# Patient Record
Sex: Female | Born: 1935 | Race: White | Hispanic: No | Smoking: Former smoker
Health system: Southern US, Community
[De-identification: ages and names within clinical notes are randomized; demographics above are authoritative.]

## PROBLEM LIST (undated history)

## (undated) DIAGNOSIS — E785 Hyperlipidemia, unspecified: Secondary | ICD-10-CM

## (undated) DIAGNOSIS — G43909 Migraine, unspecified, not intractable, without status migrainosus: Secondary | ICD-10-CM

## (undated) DIAGNOSIS — I4891 Unspecified atrial fibrillation: Secondary | ICD-10-CM

## (undated) DIAGNOSIS — I639 Cerebral infarction, unspecified: Secondary | ICD-10-CM

## (undated) HISTORY — DX: Migraine, unspecified, not intractable, without status migrainosus: G43.909

## (undated) HISTORY — DX: Cerebral infarction, unspecified: I63.9

## (undated) HISTORY — DX: Hyperlipidemia, unspecified: E78.5

## (undated) HISTORY — DX: Unspecified atrial fibrillation: I48.91

---

## 1998-08-26 ENCOUNTER — Other Ambulatory Visit: Admission: RE | Admit: 1998-08-26 | Discharge: 1998-08-26 | Payer: Self-pay

## 2005-01-27 ENCOUNTER — Emergency Department: Payer: Self-pay | Admitting: Unknown Physician Specialty

## 2005-02-01 ENCOUNTER — Ambulatory Visit: Payer: Self-pay | Admitting: Ophthalmology

## 2005-02-07 ENCOUNTER — Ambulatory Visit: Payer: Self-pay | Admitting: Ophthalmology

## 2006-08-06 ENCOUNTER — Ambulatory Visit: Payer: Self-pay | Admitting: Geriatric Medicine

## 2006-09-05 ENCOUNTER — Inpatient Hospital Stay: Payer: Self-pay | Admitting: Internal Medicine

## 2006-09-05 ENCOUNTER — Other Ambulatory Visit: Payer: Self-pay

## 2007-02-05 ENCOUNTER — Ambulatory Visit: Payer: Self-pay

## 2007-07-09 ENCOUNTER — Ambulatory Visit: Payer: Self-pay | Admitting: Internal Medicine

## 2007-08-11 ENCOUNTER — Ambulatory Visit: Payer: Self-pay | Admitting: Internal Medicine

## 2007-11-07 ENCOUNTER — Ambulatory Visit: Payer: Self-pay | Admitting: Internal Medicine

## 2007-12-04 ENCOUNTER — Other Ambulatory Visit: Payer: Self-pay

## 2007-12-04 ENCOUNTER — Ambulatory Visit: Payer: Self-pay | Admitting: Internal Medicine

## 2008-02-17 ENCOUNTER — Ambulatory Visit: Payer: Self-pay

## 2008-08-11 ENCOUNTER — Emergency Department: Payer: Self-pay | Admitting: Emergency Medicine

## 2008-10-07 ENCOUNTER — Ambulatory Visit: Payer: Self-pay | Admitting: Internal Medicine

## 2009-03-16 ENCOUNTER — Ambulatory Visit: Payer: Self-pay | Admitting: Ophthalmology

## 2009-07-18 ENCOUNTER — Emergency Department: Payer: Self-pay | Admitting: Emergency Medicine

## 2009-08-16 ENCOUNTER — Ambulatory Visit: Payer: Self-pay | Admitting: Internal Medicine

## 2009-10-31 ENCOUNTER — Ambulatory Visit: Payer: Self-pay | Admitting: Internal Medicine

## 2010-05-03 ENCOUNTER — Ambulatory Visit: Payer: Self-pay | Admitting: Internal Medicine

## 2010-07-26 ENCOUNTER — Ambulatory Visit: Payer: Medicare Other | Admitting: Internal Medicine

## 2010-12-12 ENCOUNTER — Ambulatory Visit: Payer: Medicare Other | Admitting: Internal Medicine

## 2011-02-13 ENCOUNTER — Other Ambulatory Visit (INDEPENDENT_AMBULATORY_CARE_PROVIDER_SITE_OTHER): Payer: Medicare Other | Admitting: *Deleted

## 2011-02-13 DIAGNOSIS — Z7901 Long term (current) use of anticoagulants: Secondary | ICD-10-CM

## 2011-02-14 ENCOUNTER — Encounter: Payer: Self-pay | Admitting: Internal Medicine

## 2011-03-19 ENCOUNTER — Other Ambulatory Visit (INDEPENDENT_AMBULATORY_CARE_PROVIDER_SITE_OTHER): Payer: Medicare Other | Admitting: *Deleted

## 2011-03-19 DIAGNOSIS — Z7901 Long term (current) use of anticoagulants: Secondary | ICD-10-CM

## 2011-03-20 LAB — PROTIME-INR
INR: 2.49 — ABNORMAL HIGH (ref <1.50)
Prothrombin Time: 27.7 seconds — ABNORMAL HIGH (ref 11.6–15.2)

## 2011-03-22 ENCOUNTER — Other Ambulatory Visit: Payer: Self-pay | Admitting: Internal Medicine

## 2011-03-30 ENCOUNTER — Encounter: Payer: Self-pay | Admitting: Internal Medicine

## 2011-03-30 ENCOUNTER — Ambulatory Visit (INDEPENDENT_AMBULATORY_CARE_PROVIDER_SITE_OTHER): Payer: Medicare Other | Admitting: Internal Medicine

## 2011-03-30 VITALS — BP 148/78 | HR 69 | Temp 98.2°F | Resp 16

## 2011-03-30 DIAGNOSIS — R269 Unspecified abnormalities of gait and mobility: Secondary | ICD-10-CM

## 2011-03-30 DIAGNOSIS — R2681 Unsteadiness on feet: Secondary | ICD-10-CM

## 2011-03-30 DIAGNOSIS — Z7901 Long term (current) use of anticoagulants: Secondary | ICD-10-CM

## 2011-03-30 DIAGNOSIS — R42 Dizziness and giddiness: Secondary | ICD-10-CM

## 2011-03-30 NOTE — Progress Notes (Signed)
Subjective:    Patient ID: Alicia Moran, female    DOB: 02-16-1929, 77 y.o.   MRN: [id number]  HPI 77 yo white female with history of atrial fibrillation, on chronic coumadin,  Migraine headaches, recurrent dizziness, returns for followup.  She saw her  cardiologist Dr. Arlana Pouch at [facility]  recently and had a repeat ECHO which was reportedly normal.  She has been having increased difficulty with dizziness and has been using a rolling walker for a week to accomplish her ADLs  because she has had several falls both at home and outside of the home.  She attributes her falls to a change in her vision .  Her vision has been affected by recent glaucoma surgery and she is having recurrent dizziness due to inability to focus.  No past medical history on file.  Review of Systems  Constitutional: Negative for fever, chills and unexpected weight change.  HENT: Positive for nosebleeds. Negative for hearing loss, ear pain, congestion, facial swelling, rhinorrhea, sneezing, mouth sores, trouble swallowing, neck pain, neck stiffness, voice change, postnasal drip, sinus pressure, tinnitus and ear discharge. Sore throat:       Eyes: Negative for pain, discharge, redness and visual disturbance.  Respiratory: Positive for cough. Negative for chest tightness, shortness of breath, wheezing and stridor.   Cardiovascular: Negative for chest pain, palpitations and leg swelling.  Musculoskeletal: Positive for gait problem. Negative for myalgias and arthralgias.  Skin: Negative for color change and rash.  Neurological: Positive for dizziness and light-headedness. Negative for weakness and headaches.  Hematological: Negative for adenopathy.       Objective:   Physical Exam  Constitutional: She is oriented to person, place, and time. She appears well-developed and well-nourished.  HENT:  Mouth/Throat: Oropharynx is clear and moist.  Eyes: EOM are normal. Pupils are equal, round, and reactive to light. No  scleral icterus.  Neck: Normal range of motion. Neck supple. No JVD present. No thyromegaly present.  Cardiovascular: Normal rate, regular rhythm, normal heart sounds and intact distal pulses.   Pulmonary/Chest: Effort normal and breath sounds normal.  Abdominal: Soft. Bowel sounds are normal. She exhibits no mass. There is no tenderness.  Musculoskeletal: Normal range of motion. She exhibits no edema.  Lymphadenopathy:    She has no cervical adenopathy.  Neurological: She is alert and oriented to person, place, and time. She has normal strength. Gait abnormal.       Patient is unsteady of gait due to feeling dizzy and being unable to focus eyes quickly with changes in head position.   She cannot turn or pivot without loss of balance.   Skin: Skin is warm and dry.  Psychiatric: She has a normal mood and affect.          Assessment & Plan:  Dizziness, unsteady gait:  Previous evaluatiosn have found no reversible causes.  I would like to refer her for vestibular PT, which can be done either through [facility] in the home or at [facility].

## 2011-03-30 NOTE — Patient Instructions (Signed)
To avoid an extra coumadin check,, start your melatonin the last week of October (one week before your next coumadin check).  We will check your cholesterol and b12 in two week with your next coumadin check.  [facility] wi[facility] contact you about home physical therapy for dizziness.   3 month followup

## 2011-04-01 ENCOUNTER — Encounter: Payer: Self-pay | Admitting: Internal Medicine

## 2011-04-01 DIAGNOSIS — E7800 Pure hypercholesterolemia, unspecified: Secondary | ICD-10-CM | POA: Insufficient documentation

## 2011-04-01 DIAGNOSIS — I639 Cerebral infarction, unspecified: Secondary | ICD-10-CM | POA: Insufficient documentation

## 2011-04-01 DIAGNOSIS — Z7901 Long term (current) use of anticoagulants: Secondary | ICD-10-CM | POA: Insufficient documentation

## 2011-04-01 DIAGNOSIS — G43909 Migraine, unspecified, not intractable, without status migrainosus: Secondary | ICD-10-CM | POA: Insufficient documentation

## 2011-04-01 DIAGNOSIS — I480 Paroxysmal atrial fibrillation: Secondary | ICD-10-CM | POA: Insufficient documentation

## 2011-04-01 NOTE — Assessment & Plan Note (Signed)
Her PT/INR is checked monthly and has been stable.

## 2011-04-02 ENCOUNTER — Other Ambulatory Visit: Payer: Self-pay | Admitting: Internal Medicine

## 2011-04-02 DIAGNOSIS — R42 Dizziness and giddiness: Secondary | ICD-10-CM

## 2011-04-02 DIAGNOSIS — H409 Unspecified glaucoma: Secondary | ICD-10-CM

## 2011-04-02 DIAGNOSIS — R269 Unspecified abnormalities of gait and mobility: Secondary | ICD-10-CM

## 2011-04-12 ENCOUNTER — Other Ambulatory Visit: Payer: Self-pay | Admitting: Internal Medicine

## 2011-04-12 DIAGNOSIS — R42 Dizziness and giddiness: Secondary | ICD-10-CM

## 2011-04-18 ENCOUNTER — Other Ambulatory Visit (INDEPENDENT_AMBULATORY_CARE_PROVIDER_SITE_OTHER): Payer: Medicare Other | Admitting: *Deleted

## 2011-04-18 DIAGNOSIS — Z7901 Long term (current) use of anticoagulants: Secondary | ICD-10-CM

## 2011-04-18 LAB — PROTIME-INR
INR: 2.5 ratio — ABNORMAL HIGH (ref 0.8–1.0)
Prothrombin Time: 27.5 s — ABNORMAL HIGH (ref 10.2–12.4)

## 2011-05-02 DIAGNOSIS — R42 Dizziness and giddiness: Secondary | ICD-10-CM

## 2011-05-02 DIAGNOSIS — M216X9 Other acquired deformities of unspecified foot: Secondary | ICD-10-CM

## 2011-05-02 DIAGNOSIS — IMO0001 Reserved for inherently not codable concepts without codable children: Secondary | ICD-10-CM

## 2011-05-02 DIAGNOSIS — R269 Unspecified abnormalities of gait and mobility: Secondary | ICD-10-CM

## 2011-05-21 ENCOUNTER — Other Ambulatory Visit (INDEPENDENT_AMBULATORY_CARE_PROVIDER_SITE_OTHER): Payer: Medicare Other | Admitting: *Deleted

## 2011-05-21 DIAGNOSIS — Z7901 Long term (current) use of anticoagulants: Secondary | ICD-10-CM

## 2011-05-21 LAB — PROTIME-INR: Prothrombin Time: 30.7 s — ABNORMAL HIGH (ref 10.2–12.4)

## 2011-06-13 ENCOUNTER — Encounter: Payer: Medicare Other | Admitting: Otolaryngology

## 2011-06-20 ENCOUNTER — Other Ambulatory Visit (INDEPENDENT_AMBULATORY_CARE_PROVIDER_SITE_OTHER): Payer: Medicare Other | Admitting: *Deleted

## 2011-06-20 DIAGNOSIS — Z7901 Long term (current) use of anticoagulants: Secondary | ICD-10-CM

## 2011-06-20 LAB — PROTIME-INR: Prothrombin Time: 32.8 s — ABNORMAL HIGH (ref 10.2–12.4)

## 2011-06-29 ENCOUNTER — Encounter: Payer: Self-pay | Admitting: Internal Medicine

## 2011-06-29 ENCOUNTER — Ambulatory Visit (INDEPENDENT_AMBULATORY_CARE_PROVIDER_SITE_OTHER): Payer: Medicare Other | Admitting: Internal Medicine

## 2011-06-29 DIAGNOSIS — E785 Hyperlipidemia, unspecified: Secondary | ICD-10-CM

## 2011-06-29 DIAGNOSIS — I4891 Unspecified atrial fibrillation: Secondary | ICD-10-CM

## 2011-06-29 DIAGNOSIS — Z7901 Long term (current) use of anticoagulants: Secondary | ICD-10-CM

## 2011-06-29 DIAGNOSIS — H532 Diplopia: Secondary | ICD-10-CM

## 2011-06-29 DIAGNOSIS — Z79899 Other long term (current) drug therapy: Secondary | ICD-10-CM

## 2011-06-29 NOTE — Progress Notes (Signed)
Subjective:    Patient ID: Alicia Moran, female    DOB: [birth year], 78 y.o.   MRN: [id number]  HPI  Alicia Moran is an 84 yr white female with a history of atrial fibrillation , on chronic anticoagulation with coumadin, who presents for 6 month followup.  She has been having double vision since her eye surgery in April. She has been referred to a neuromuscular eye specialist at [facility] but has not seen him yet.  She also reports that her balance and gait have also been worse since April and wonders if she had a light CVA furing the peroperative period when she was not anticoagulated.  Her double vision is brought on by deviationof eyes to her left and downward, but not when looking directly in .front of her . Past Medical History  Diagnosis Date  . Atrial fibrillation   . Stroke   . Migraine headache   . Hyperlipidemia    Current Outpatient Prescriptions on File Prior to Visit  Medication Sig Dispense Refill  . simvastatin (ZOCOR) 10 MG tablet Take 10 mg by mouth at bedtime.        Marland Kitchen warfarin (COUMADIN) 4 MG tablet           Review of Systems  Constitutional: Negative for fever, chills, appetite change, fatigue and unexpected weight change.  HENT: Negative for ear pain, congestion, sore throat, trouble swallowing, neck pain, voice change and sinus pressure.   Eyes: Negative for visual disturbance.  Respiratory: Negative for cough, shortness of breath, wheezing and stridor.   Cardiovascular: Negative for chest pain, palpitations and leg swelling.  Gastrointestinal: Negative for nausea, vomiting, abdominal pain, diarrhea, constipation, blood in stool, abdominal distention and anal bleeding.  Genitourinary: Negative for dysuria and flank pain.  Musculoskeletal: Positive for gait problem. Negative for myalgias and arthralgias.  Skin: Negative for color change and rash.  Neurological: Positive for light-headedness. Negative for dizziness and headaches.  Hematological: Negative for adenopathy.  Does not bruise/bleed easily.  Psychiatric/Behavioral: Negative for suicidal ideas, sleep disturbance and dysphoric mood. The patient is not nervous/anxious.        Objective:   Physical Exam  Constitutional: She is oriented to person, place, and time. She appears well-developed and well-nourished.  HENT:  Mouth/Throat: Oropharynx is clear and moist.  Eyes: EOM are normal. Pupils are equal, round, and reactive to light. No scleral icterus.  Neck: Normal range of motion. Neck supple. No JVD present. No thyromegaly present.  Cardiovascular: Normal rate, regular rhythm, normal heart sounds and intact distal pulses.   Pulmonary/Chest: Effort normal and breath sounds normal.  Abdominal: Soft. Bowel sounds are normal. She exhibits no mass. There is no tenderness.  Musculoskeletal: Normal range of motion. She exhibits no edema.  Lymphadenopathy:    She has no cervical adenopathy.  Neurological: She is alert and oriented to person, place, and time.  Skin: Skin is warm and dry.  Psychiatric: She has a normal mood and affect.          Assessment & Plan:   Diplopia Positional, suggesting that her medial rectus muscle was either damaged during the surgery or during a perioperative CVA.  Since she is scheduled to see a neuromuscular specialist, will defer MRI in the event that he wants one done at [facility] rather than at [facility].   Hyperlipidemia Managed with simvastatin, lipids due.  Goal is < 100; prior cva was embolic.  Atrial fibrillation Rate controlled with metoprolol.  Encounter for long-term (current) use of anticoagulants She  is due for PT/INR ; goal is 2.0 to 3.0 for mitigation of embolic phenomena due to atrial fib.     Updated Medication List Outpatient Encounter Prescriptions as of 06/29/2011  Medication Sig Dispense Refill  . metoprolol succinate (TOPROL XL) 25 MG 24 hr tablet Take 25 mg by mouth daily.      . simvastatin (ZOCOR) 10 MG tablet Take 10 mg by mouth at bedtime.         Marland Kitchen warfarin (COUMADIN) 4 MG tablet        . DISCONTD: metoprolol tartrate (LOPRESSOR) 25 MG tablet Take 25 mg by mouth daily.

## 2011-07-01 ENCOUNTER — Encounter: Payer: Self-pay | Admitting: Internal Medicine

## 2011-07-01 DIAGNOSIS — H532 Diplopia: Secondary | ICD-10-CM | POA: Insufficient documentation

## 2011-07-01 NOTE — Patient Instructions (Signed)
Return in [facility] February for your fasting labs and Pt/INR

## 2011-07-01 NOTE — Assessment & Plan Note (Signed)
Positional, suggesting that her medial rectus muscle was either damaged during the surgery or during a perioperative CVA.  Since she is scheduled to see a neuromuscular specialist, will defer MRI in the event that he wants one done at [facility] rather than at [facility].

## 2011-07-01 NOTE — Assessment & Plan Note (Signed)
Managed with simvastatin, lipids due.  Goal is < 100; prior cva was embolic.

## 2011-07-01 NOTE — Assessment & Plan Note (Signed)
She is due for PT/INR ; goal is 2.0 to 3.0 for mitigation of embolic phenomena due to atrial fib.

## 2011-07-01 NOTE — Assessment & Plan Note (Signed)
Rate controlled with metoprolol °

## 2011-07-04 ENCOUNTER — Telehealth: Payer: Self-pay | Admitting: *Deleted

## 2011-07-04 NOTE — Telephone Encounter (Signed)
If she is not having fevers or green/brown sputum she does not need an antibiotic because she has a viral infection.  She can take Robitussin DM or any cough syrup that has guaifenesin and dextromethorphan comibined for cough.  Gargle with salt water for the sore throat.  If no better by Friday, I will call her in an antibioitc

## 2011-07-04 NOTE — Telephone Encounter (Signed)
Pt states she has had tightness in her chest since Saturday morning, sore throat, dry cough and dry mouth.  She had temp of 99.2 on Saturday.  She is requesting antibiotic, but I told her she may need to be seen first.  What do you suggest she take otc?  She is drinking plenty of water.

## 2011-07-04 NOTE — Telephone Encounter (Signed)
Advised pt, she will call back if not better.

## 2011-07-10 ENCOUNTER — Telehealth: Payer: Self-pay | Admitting: Internal Medicine

## 2011-07-10 NOTE — Telephone Encounter (Signed)
Sudafed pe 10  Mg every 6 hours prn congestion

## 2011-07-10 NOTE — Telephone Encounter (Signed)
[phone] Pt called her rx from Friday has not been called in [facility] pharmacy

## 2011-07-10 NOTE — Telephone Encounter (Signed)
See note from 1/16.  Pt is still not any better, taking mucinix and drinking plenty of fluids but that's not helping.  She has a lot of sinus pressure, feels like her head is going to explode.  [facility]ses [facility].

## 2011-07-11 NOTE — Telephone Encounter (Signed)
Pt has been advised.

## 2011-07-23 ENCOUNTER — Other Ambulatory Visit (INDEPENDENT_AMBULATORY_CARE_PROVIDER_SITE_OTHER): Payer: Medicare Other | Admitting: *Deleted

## 2011-07-23 ENCOUNTER — Encounter: Payer: Self-pay | Admitting: *Deleted

## 2011-07-23 DIAGNOSIS — Z79899 Other long term (current) drug therapy: Secondary | ICD-10-CM

## 2011-07-23 DIAGNOSIS — Z7901 Long term (current) use of anticoagulants: Secondary | ICD-10-CM

## 2011-07-23 DIAGNOSIS — E785 Hyperlipidemia, unspecified: Secondary | ICD-10-CM

## 2011-07-23 DIAGNOSIS — D510 Vitamin B12 deficiency anemia due to intrinsic factor deficiency: Secondary | ICD-10-CM

## 2011-07-23 LAB — COMPREHENSIVE METABOLIC PANEL
ALT: 11 U/L (ref 0–35)
Albumin: 4.1 g/dL (ref 3.5–5.2)
Alkaline Phosphatase: 82 U/L (ref 39–117)
Potassium: 4 mEq/L (ref 3.5–5.1)
Sodium: 135 mEq/L (ref 135–145)
Total Bilirubin: 0.6 mg/dL (ref 0.3–1.2)
Total Protein: 6.7 g/dL (ref 6.0–8.3)

## 2011-07-23 LAB — LIPID PANEL
HDL: 66.4 mg/dL (ref >39.00)
Total CHOL/HDL Ratio: 2
VLDL: 6.6 mg/dL (ref 0.0–40.0)

## 2011-07-25 ENCOUNTER — Other Ambulatory Visit: Payer: Medicare Other

## 2011-07-25 DIAGNOSIS — Z1211 Encounter for screening for malignant neoplasm of colon: Secondary | ICD-10-CM

## 2011-07-25 LAB — FECAL OCCULT BLOOD, IMMUNOCHEMICAL: Fecal Occult Bld: NEGATIVE

## 2011-08-20 ENCOUNTER — Other Ambulatory Visit (INDEPENDENT_AMBULATORY_CARE_PROVIDER_SITE_OTHER): Payer: Medicare Other | Admitting: *Deleted

## 2011-08-20 DIAGNOSIS — Z7901 Long term (current) use of anticoagulants: Secondary | ICD-10-CM

## 2011-08-20 DIAGNOSIS — I4891 Unspecified atrial fibrillation: Secondary | ICD-10-CM

## 2011-08-20 LAB — PROTIME-INR: INR: 3.5 ratio — ABNORMAL HIGH (ref 0.8–1.0)

## 2011-08-22 ENCOUNTER — Other Ambulatory Visit: Payer: Self-pay | Admitting: *Deleted

## 2011-08-22 MED ORDER — WARFARIN SODIUM 3 MG PO TABS: 3.0000 mg | ORAL_TABLET | Freq: Every day | ORAL | Status: DC

## 2011-09-03 ENCOUNTER — Other Ambulatory Visit (INDEPENDENT_AMBULATORY_CARE_PROVIDER_SITE_OTHER): Payer: Medicare Other | Admitting: *Deleted

## 2011-09-03 DIAGNOSIS — I4891 Unspecified atrial fibrillation: Secondary | ICD-10-CM

## 2011-09-03 DIAGNOSIS — Z7901 Long term (current) use of anticoagulants: Secondary | ICD-10-CM

## 2011-09-03 LAB — PROTIME-INR
INR: 2.3 ratio — ABNORMAL HIGH (ref 0.8–1.0)
Prothrombin Time: 24.9 s — ABNORMAL HIGH (ref 10.2–12.4)

## 2011-09-28 ENCOUNTER — Encounter: Payer: Self-pay | Admitting: Internal Medicine

## 2011-10-03 ENCOUNTER — Encounter: Payer: Self-pay | Admitting: Internal Medicine

## 2011-10-03 ENCOUNTER — Ambulatory Visit (INDEPENDENT_AMBULATORY_CARE_PROVIDER_SITE_OTHER): Payer: Medicare Other | Admitting: Internal Medicine

## 2011-10-03 ENCOUNTER — Other Ambulatory Visit (INDEPENDENT_AMBULATORY_CARE_PROVIDER_SITE_OTHER): Payer: Medicare Other | Admitting: *Deleted

## 2011-10-03 VITALS — BP 130/82 | HR 67 | Temp 98.7°F | Resp 16 | Wt 143.5 lb

## 2011-10-03 DIAGNOSIS — Z7901 Long term (current) use of anticoagulants: Secondary | ICD-10-CM

## 2011-10-03 DIAGNOSIS — L723 Sebaceous cyst: Secondary | ICD-10-CM

## 2011-10-03 NOTE — Progress Notes (Signed)
Patient ID: Alicia Moran, female   DOB: 06-01-1929, 78 y.o.   MRN: [id number]  Patient Active Problem List  Diagnoses  . Atrial fibrillation  . Stroke  . Migraine headache  . Hyperlipidemia  . Encounter for long-term (current) use of anticoagulants  . Diplopia  . Sebaceous cyst    Subjective:  CC:   No chief complaint on file.   HPI:   Alicia Moran a 78 y.o. female who presents with a small tender mass on left buttock near the rectum.  Noticed tenderness a day or two ago and was concerned about a hemorrhoid.  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 ,  Complicated by dizziness.  Saw a neuro opthalmologist  Alicia Moran, [facility] on [address] who opined that the double vision is a result of the surgery .  Because of her persistent dizziness he has recommended she see a neurologist at [facility].     Past Medical History  Diagnosis Date  . Atrial fibrillation   . Migraine headache   . Hyperlipidemia   . Stroke     No past surgical history on file.       The following portions of the patient's history were reviewed and updated as appropriate: Allergies, current medications, and problem list.    Review of Systems:   12 Pt  review of systems was negative except those addressed in the HPI,     History   Social History  . Marital Status: Married    Spouse Name: N/A    Number of Children: N/A  . Years of Education: N/A   Occupational History  . Not on file.   Social History Main Topics  . Smoking status: Former Smoker    Quit date: 03/29/1986  . Smokeless tobacco: Never Used  . Alcohol Use: No  . Drug Use: No  . Sexually Active: Not on file   Other Topics Concern  . Not on file   Social History Narrative  . No narrative on file    Objective:  BP 130/82  Pulse 67  Temp(Src) 98.7 F (37.1 C) (Oral)  Resp 16  Wt 143 lb 8 oz (65.091 kg)  SpO2 99%  General appearance:  alert, cooperative and appears stated age Ears: normal TM's and external ear canals both ears Throat: lips, mucosa, and tongue normal; teeth and gums normal Neck: no adenopathy, no carotid bruit, supple, symmetrical, trachea midline and thyroid not enlarged, symmetric, no tenderness/mass/nodules Back: symmetric, no curvature. ROM normal. No CVA tenderness. Lungs: clear to auscultation bilaterally Heart: regular rate and rhythm, S1, S2 normal, no murmur, click, rub or gallop Abdomen: soft, non-tender; bowel sounds normal; no masses,  no organomegaly Rectal: perirectal impacted comedone vs sebaceous cyst.  Pulses: 2+ and symmetric Skin: Skin color, texture, turgor normal. No rashes or lesions Lymph nodes: Cervical, supraclavicular, and axillary nodes normal.  Assessment and Plan:  Sebaceous cyst She has a sebaceous cyst or impacted comedone near her rectunm that is paingul but not enflamed.  I was unable to disimpact it in the office without  Anesthesia due to its tenderness. Will refer to Gen Surg  For management.     Updated Medication List Outpatient Encounter Prescriptions as of 10/03/2011  Medication Sig Dispense Refill  . metoprolol succinate (TOPROL XL) 25 MG 24 hr tablet Take 25 mg by mouth daily.      . simvastatin (ZOCOR) 10 MG tablet Take 10 mg  by mouth at bedtime.        Marland Kitchen warfarin (COUMADIN) 3 MG tablet Take 3 mg by mouth. 3 days a week      . warfarin (COUMADIN) 4 MG tablet 4 days a week      . DISCONTD: warfarin (COUMADIN) 3 MG tablet Take 1 tablet (3 mg total) by mouth daily.  30 tablet  1     Orders Placed This Encounter  Procedures  . Ambulatory referral to General Surgery    No Follow-up on file.

## 2011-10-04 DIAGNOSIS — L723 Sebaceous cyst: Secondary | ICD-10-CM | POA: Insufficient documentation

## 2011-10-04 NOTE — Assessment & Plan Note (Signed)
She has a sebaceous cyst or impacted comedone near her rectunm that is paingul but not enflamed.  I was unable to disimpact it in the office without  Anesthesia due to its tenderness. Will refer to Gen Surg  For management.

## 2011-10-08 ENCOUNTER — Other Ambulatory Visit: Payer: Self-pay | Admitting: Internal Medicine

## 2011-10-08 DIAGNOSIS — R42 Dizziness and giddiness: Secondary | ICD-10-CM

## 2011-11-05 ENCOUNTER — Other Ambulatory Visit: Payer: Self-pay | Admitting: *Deleted

## 2011-11-05 ENCOUNTER — Other Ambulatory Visit (INDEPENDENT_AMBULATORY_CARE_PROVIDER_SITE_OTHER): Payer: Medicare Other | Admitting: *Deleted

## 2011-11-05 DIAGNOSIS — Z7901 Long term (current) use of anticoagulants: Secondary | ICD-10-CM

## 2011-11-05 LAB — PROTIME-INR: Prothrombin Time: 16.5 s — ABNORMAL HIGH (ref 10.2–12.4)

## 2011-11-05 MED ORDER — ALPRAZOLAM 0.25 MG PO TABS: 0.2500 mg | ORAL_TABLET | Freq: Two times a day (BID) | ORAL | Status: DC | PRN

## 2011-11-06 ENCOUNTER — Other Ambulatory Visit: Payer: Self-pay | Admitting: Internal Medicine

## 2011-11-06 MED ORDER — WARFARIN SODIUM 4 MG PO TABS: ORAL_TABLET | ORAL | Status: DC

## 2011-11-06 MED ORDER — WARFARIN SODIUM 4 MG PO TABS: 4.0000 mg | ORAL_TABLET | Freq: Every day | ORAL | Status: DC

## 2011-11-13 ENCOUNTER — Telehealth: Payer: Self-pay | Admitting: Internal Medicine

## 2011-11-13 DIAGNOSIS — Z1239 Encounter for other screening for malignant neoplasm of breast: Secondary | ICD-10-CM

## 2011-11-13 NOTE — Telephone Encounter (Signed)
Placed in epic

## 2011-11-13 NOTE — Telephone Encounter (Signed)
Pt received letter stated it was time for mammogram [place] pt would like appointment right after lunch  Between 1-2

## 2011-11-14 NOTE — Telephone Encounter (Signed)
Called and spoke with Herbert Seta at [facility] June 27 at 1:15 with arrival time at 1:00./Patient is aware of appt/slj

## 2011-11-20 ENCOUNTER — Other Ambulatory Visit (INDEPENDENT_AMBULATORY_CARE_PROVIDER_SITE_OTHER): Payer: Medicare Other | Admitting: *Deleted

## 2011-11-20 DIAGNOSIS — Z7901 Long term (current) use of anticoagulants: Secondary | ICD-10-CM

## 2011-11-21 ENCOUNTER — Other Ambulatory Visit: Payer: Self-pay | Admitting: Internal Medicine

## 2011-11-21 MED ORDER — WARFARIN SODIUM 4 MG PO TABS: 4.0000 mg | ORAL_TABLET | Freq: Every day | ORAL | Status: DC

## 2011-12-01 DIAGNOSIS — H409 Unspecified glaucoma: Secondary | ICD-10-CM | POA: Insufficient documentation

## 2011-12-03 ENCOUNTER — Other Ambulatory Visit: Payer: Self-pay | Admitting: Neurology

## 2011-12-03 DIAGNOSIS — I6789 Other cerebrovascular disease: Secondary | ICD-10-CM

## 2011-12-11 ENCOUNTER — Telehealth: Payer: Self-pay | Admitting: Internal Medicine

## 2011-12-11 NOTE — Telephone Encounter (Signed)
Caller: Alicia Moran; PCP: Duncan Dull  Call regarding MRI/MRA Scheduled for 12/19/11 and Neurology Advised To Call MD to find out what Medication to take To Help Relax since she is Clostraphobic; She has Xanax 0.25 mgs at home that she uses to help with sleep. She is prescribed  1 po BID prn and but is not sure if that is what she should take and how much. She normally takes 1/2 pill on occasion (not regularly). PLEASE ASK DR. Darrick Huntsman WHAT SHE RECOMMENDS AND CALL BACK; CB#: [phone] Medication Questions Protocol.

## 2011-12-11 NOTE — Telephone Encounter (Signed)
If 1/2 pill makes her sleepy,  I Would take that dose before leaving home,  And bring another dose of alprazolam with her in case she needs another one while waiting. Someone will need to drive her.

## 2011-12-12 NOTE — Telephone Encounter (Signed)
Patient notified

## 2011-12-14 ENCOUNTER — Other Ambulatory Visit: Payer: Self-pay | Admitting: Internal Medicine

## 2011-12-14 MED ORDER — SIMVASTATIN 10 MG PO TABS: 10.0000 mg | ORAL_TABLET | Freq: Every day | ORAL | Status: DC

## 2011-12-14 MED ORDER — METOPROLOL SUCCINATE ER 25 MG PO TB24: 25.0000 mg | ORAL_TABLET | Freq: Every day | ORAL | Status: DC

## 2011-12-18 ENCOUNTER — Other Ambulatory Visit (INDEPENDENT_AMBULATORY_CARE_PROVIDER_SITE_OTHER): Payer: Medicare Other | Admitting: *Deleted

## 2011-12-18 DIAGNOSIS — Z7901 Long term (current) use of anticoagulants: Secondary | ICD-10-CM

## 2011-12-18 LAB — PROTIME-INR: INR: 2.5 ratio — ABNORMAL HIGH (ref 0.8–1.0)

## 2011-12-24 ENCOUNTER — Ambulatory Visit: Payer: Medicare Other | Admitting: Internal Medicine

## 2011-12-24 ENCOUNTER — Encounter: Payer: Self-pay | Admitting: Internal Medicine

## 2011-12-24 ENCOUNTER — Ambulatory Visit (INDEPENDENT_AMBULATORY_CARE_PROVIDER_SITE_OTHER): Payer: Medicare Other | Admitting: Internal Medicine

## 2011-12-24 VITALS — BP 162/80 | HR 68 | Temp 97.8°F | Resp 16 | Wt 139.0 lb

## 2011-12-24 DIAGNOSIS — Z1211 Encounter for screening for malignant neoplasm of colon: Secondary | ICD-10-CM

## 2011-12-24 DIAGNOSIS — R7309 Other abnormal glucose: Secondary | ICD-10-CM

## 2011-12-24 DIAGNOSIS — E785 Hyperlipidemia, unspecified: Secondary | ICD-10-CM

## 2011-12-24 DIAGNOSIS — R739 Hyperglycemia, unspecified: Secondary | ICD-10-CM

## 2011-12-24 DIAGNOSIS — M546 Pain in thoracic spine: Secondary | ICD-10-CM

## 2011-12-24 DIAGNOSIS — R42 Dizziness and giddiness: Secondary | ICD-10-CM

## 2011-12-24 DIAGNOSIS — M549 Dorsalgia, unspecified: Secondary | ICD-10-CM

## 2011-12-24 LAB — HEMOGLOBIN A1C: Hgb A1c MFr Bld: 5.4 % (ref <5.7)

## 2011-12-24 NOTE — Assessment & Plan Note (Signed)
Workup for cerebrovascular disease and cervical spinal stenosis underway.  Her lipids are well controlled by February lipids. LDL 53,  And do not need repeating.  CMET to be done today

## 2011-12-24 NOTE — Assessment & Plan Note (Addendum)
I have recommended that she return to Dr. Evette Cristal for colonoscopy.I will recommend that she use lovenox for bridging anticoagulation therapy while her coumadin is suspended.

## 2011-12-24 NOTE — Patient Instructions (Addendum)
You may try Gas X for sudden pain associated with gassy bloating.  We will send your labs to Dr Pearlean Brownie  Take an alprazolam1 hour  before your MRI,  Take another one with you to the procedure in case you need a second

## 2011-12-24 NOTE — Assessment & Plan Note (Signed)
Appears to have been due to muscle strain from her recent fall.  Exam is normal except for muscle tenderness under both axillae.

## 2011-12-24 NOTE — Progress Notes (Signed)
Patient ID: Alicia Moran, female   DOB: 06/25/1928, 78 y.o.   MRN: [id number]  Patient Active Problem List  Diagnosis  . Atrial fibrillation  . Stroke  . Migraine headache  . Hyperlipidemia  . Encounter for long-term (current) use of anticoagulants  . Diplopia  . Cyst of skin and subcutaneous tissue  . Postural dizziness  . Screening for colon cancer  . Mid-back pain, acute    Subjective:  CC:   Chief Complaint  Patient presents with  . Advice Only    discuss colonoscopy    HPI:   Alicia Moran a 78 y.o. female who presents for follow up on hypertension and hyperlipidemia which have been well controlled last LDL 53 on zocor 10 mg  In feb.  She has gained a few lbs since last visit due to inactivity.  She has had 2 recent falls at home with no fractures but has bruised both knees. Both occurred while bending over to pick up a package,. Her neurologist Dr. Pearlean Brownie has given her an order for repeat lipids prior to obtaining an MRI/MRA of brain along with an MRI of cervical spine.  Causes of falls are not related to environment.  She has no loose throw rugs or wrinkled carpets, no slick floors.  All rooms are carpeted.  She had an episode of severe back pain which occurred yesterday and involved the mid torso region and occurred yesterday while walking back to bed.  She had no dyspnea or constipation.  She had fallen on Tuesday prior .  Took 2 tyenlol,  Pain improved. After last visit she was referred to Dr. Evette Cristal for evaluation of a perirectal cyst which was evaluated. Recommendations for colonoscopy were given due to her family history and age.  She is undecided.    Past Medical History  Diagnosis Date  . Atrial fibrillation   . Migraine headache   . Hyperlipidemia   . Stroke     History reviewed. No pertinent past surgical history. The following portions of the patient's history were reviewed and updated as appropriate: Allergies, current medications, and problem  list.    Review of Systems:  Comprehensive  review of systems was negative except those addressed in the HPI,     History   Social History  . Marital Status: Married    Spouse Name: N/A    Number of Children: N/A  . Years of Education: N/A   Occupational History  . Not on file.   Social History Main Topics  . Smoking status: Former Smoker    Quit date: 03/29/1986  . Smokeless tobacco: Never Used  . Alcohol Use: No  . Drug Use: No  . Sexually Active: Not on file   Other Topics Concern  . Not on file   Social History Narrative  . No narrative on file    Objective:  BP 162/80  Pulse 68  Temp 97.8 F (36.6 C) (Oral)  Resp 16  Wt 139 lb (63.05 kg)  SpO2 97%  General appearance: alert, cooperative and appears stated age Ears: normal TM's and external ear canals both ears Throat: lips, mucosa, and tongue normal; teeth and gums normal Neck: no adenopathy, no carotid bruit, supple, symmetrical, trachea midline and thyroid not enlarged, symmetric, no tenderness/mass/nodules Back: symmetric, no curvature. ROM normal. No CVA tenderness. Lungs: clear to auscultation bilaterally Heart: regular rate and rhythm, S1, S2 normal, no murmur, click, rub or gallop Abdomen: soft, non-tender; bowel sounds normal; no masses,  no organomegaly Pulses:  2+ and symmetric Skin: Skin color, texture, turgor normal. No rashes or lesions Lymph nodes: Cervical, supraclavicular, and axillary nodes normal. Neuro: grossly nonfocal.   Assessment and Plan:  Postural dizziness Workup for cerebrovascular disease and cervical spinal stenosis underway.  Her lipids are well controlled by February lipids. LDL 53,  And do not need repeating.  CMET to be done today  Screening for colon cancer I have recommended that she return to Dr. Evette Cristal for colonoscopy.I will recommend that she use lovenox for bridging anticoagulation therapy while her coumadin is suspended.   Mid-back pain, acute Appears to  have been due to muscle strain from her recent fall.  Exam is normal except for muscle tenderness under both axillae.   Updated Medication List Outpatient Encounter Prescriptions as of 12/24/2011  Medication Sig Dispense Refill  . metoprolol succinate (TOPROL XL) 25 MG 24 hr tablet Take 1 tablet (25 mg total) by mouth daily.  90 tablet  3  . simvastatin (ZOCOR) 10 MG tablet Take 1 tablet (10 mg total) by mouth at bedtime.  90 tablet  3  . warfarin (COUMADIN) 4 MG tablet Take 1 tablet (4 mg total) by mouth daily.  30 tablet  5     Orders Placed This Encounter  Procedures  . COMPLETE METABOLIC PANEL WITH GFR  . Hemoglobin A1c  . Lipid panel  . Comp Met (CMET)  . Hemoglobin A1c  . Lipid panel    Return in about 3 months (around 03/25/2012).

## 2011-12-25 ENCOUNTER — Ambulatory Visit: Payer: Self-pay | Admitting: Internal Medicine

## 2011-12-25 LAB — COMPREHENSIVE METABOLIC PANEL
ALT: 10 U/L (ref 0–35)
AST: 18 U/L (ref 0–37)
Albumin: 4.3 g/dL (ref 3.5–5.2)
CO2: 26 mEq/L (ref 19–32)
Calcium: 9.1 mg/dL (ref 8.4–10.5)
Chloride: 97 mEq/L (ref 96–112)
Creat: 0.8 mg/dL (ref 0.50–1.10)
Potassium: 4.5 mEq/L (ref 3.5–5.3)
Total Protein: 6.7 g/dL (ref 6.0–8.3)

## 2011-12-25 LAB — LIPID PANEL
LDL Cholesterol: 66 mg/dL (ref 0–99)
Triglycerides: 62 mg/dL (ref <150)
VLDL: 12 mg/dL (ref 0–40)

## 2011-12-26 ENCOUNTER — Telehealth: Payer: Self-pay | Admitting: Internal Medicine

## 2011-12-26 NOTE — Telephone Encounter (Signed)
Her mammogram was normal.  Repeat in one year 

## 2011-12-27 NOTE — Telephone Encounter (Signed)
Patient notified

## 2012-01-08 ENCOUNTER — Encounter: Payer: Self-pay | Admitting: Internal Medicine

## 2012-01-15 ENCOUNTER — Encounter: Payer: Self-pay | Admitting: Internal Medicine

## 2012-01-21 ENCOUNTER — Other Ambulatory Visit (INDEPENDENT_AMBULATORY_CARE_PROVIDER_SITE_OTHER): Payer: Medicare Other | Admitting: *Deleted

## 2012-01-21 DIAGNOSIS — Z7901 Long term (current) use of anticoagulants: Secondary | ICD-10-CM

## 2012-01-21 LAB — PROTIME-INR: Prothrombin Time: 34.1 s — ABNORMAL HIGH (ref 10.2–12.4)

## 2012-02-06 ENCOUNTER — Ambulatory Visit (INDEPENDENT_AMBULATORY_CARE_PROVIDER_SITE_OTHER): Payer: Medicare Other | Admitting: *Deleted

## 2012-02-06 ENCOUNTER — Telehealth: Payer: Self-pay | Admitting: Internal Medicine

## 2012-02-06 DIAGNOSIS — Z7901 Long term (current) use of anticoagulants: Secondary | ICD-10-CM

## 2012-02-06 NOTE — Telephone Encounter (Signed)
Caller: [name]/Patient; Patient Name: Alicia Moran; PCP: Duncan Dull; Best Callback Phone Number: [phone]. States that she spoke with triage nurse Vicky earlier today and was told that she needed to be seen. Note was sent in EPIC to Follow Up with patient regarding possible need for appointment or PT/INR. Patient has not heard from anyone. Verified in EPIC that note has been sent to Dr. Darrick Huntsman regarding this with no response. Authorized by Peri Jefferson, RN, to call office. Called office, spoke with Zella Ball, states that she will send a note to Dr. Melina Schools nurse for follow up and to let them know the patient is calling back again.

## 2012-02-06 NOTE — Telephone Encounter (Signed)
Call a nurse called back stated pt has called them again. Please advise

## 2012-02-06 NOTE — Telephone Encounter (Signed)
Patient has a bruise!!!! She does not need to be seen!  PT INR ok to do I called her myself /

## 2012-02-06 NOTE — Telephone Encounter (Signed)
See note below Dr. Darrick Huntsman, do you want patient to come in for pt/inr or do you want to see her?

## 2012-02-06 NOTE — Telephone Encounter (Signed)
Caller: Liara/Patient; Patient Name: Alicia Moran; PCP: Duncan Dull; Best Callback Phone Number: [phone]  Today, 02/06/2012, pt calling because last night, 02/05/2012 , she noticed a purple area that was  slightly larger than a pencil   eraser located  near wrist on the "end of a vein".    She saw no other  purple areas, nor rash on her body and no bleeding.  No known injury ,and no bug bites.  Today the area is more brown colored , but  no larger.   Pt does take Coumadin and got nervous , and didn't take her dose last night. She  has been taking  Coumadin 4 mg daily  since ~ 01/21/2012  for history CVA and Afib. Pt wonders if she just needs lab visit.  RN reached  ED Dispositon for purple rash per Rash protocol   - pt denies any new purple areas and no bleeding. RN tried to schedule appointment this morning and there are none.RN  called [employer] forces operational officer and  was advised to send note to office per practice orders ---  OFFICE PLEASE FOLLOW UP WITH PT ABOUT APPOINTMENT TO BE SEEN.

## 2012-03-11 ENCOUNTER — Other Ambulatory Visit (INDEPENDENT_AMBULATORY_CARE_PROVIDER_SITE_OTHER): Payer: Medicare Other

## 2012-03-11 DIAGNOSIS — Z7901 Long term (current) use of anticoagulants: Secondary | ICD-10-CM

## 2012-03-11 LAB — PROTIME-INR
INR: 2.4 ratio — ABNORMAL HIGH (ref 0.8–1.0)
Prothrombin Time: 24.5 s — ABNORMAL HIGH (ref 10.2–12.4)

## 2012-03-12 ENCOUNTER — Telehealth: Payer: Self-pay | Admitting: Internal Medicine

## 2012-03-12 DIAGNOSIS — IMO0001 Reserved for inherently not codable concepts without codable children: Secondary | ICD-10-CM

## 2012-03-12 MED ORDER — OMEPRAZOLE 40 MG PO CPDR: 40.0000 mg | DELAYED_RELEASE_CAPSULE | Freq: Every day | ORAL | Status: DC

## 2012-03-12 NOTE — Telephone Encounter (Signed)
Patient notified

## 2012-03-12 NOTE — Telephone Encounter (Signed)
I was calling patient to notifiy her of the coumadin results.  While she was on the phone she mentioned she was having bad acid reflux for the past fews days and the prevacid she has been taking is only helping a little bit, but not fully.  She wanted to know if you could call in omeprazole for her.  Please advise.

## 2012-03-12 NOTE — Telephone Encounter (Signed)
Yes,  Called in ,  If no improvement in a week have her make appt to be seen

## 2012-03-17 ENCOUNTER — Telehealth: Payer: Self-pay | Admitting: Internal Medicine

## 2012-03-17 NOTE — Telephone Encounter (Signed)
Caller: Reha/Patient; Patient Name: Alicia Moran; PCP: Duncan Dull (Adults only); Best Callback Phone Number: [phone]  Today, 03/17/2012, pt calling stating she has had nausea everyday since 03/02/2012 . No vomiting. Has been taking Pepcid and Gas X and sometimes helps. Always wakes up with the nausea. Pt stating has been drinking fluids and eating more starch foods. Pt stating she called office 03/12/2012  and left message with Morrie Sheldon about reflux and RN advised that Omeprazole was called in per EPIC note, but she didn't know. RN triaged and reached See in 72  hours Disposition for nausea > 7 days per Nausea or Vomiting protocol. Pt will pick up Rx,  but still wants  appt to be seen. Appt scheduled for 03/18/2012 at 0930  with Dr. Darrick Huntsman

## 2012-03-18 ENCOUNTER — Ambulatory Visit (INDEPENDENT_AMBULATORY_CARE_PROVIDER_SITE_OTHER): Payer: Medicare Other | Admitting: Internal Medicine

## 2012-03-18 ENCOUNTER — Encounter: Payer: Self-pay | Admitting: Internal Medicine

## 2012-03-18 VITALS — BP 127/74 | HR 65 | Temp 97.5°F | Resp 16 | Ht 65.0 in | Wt 138.0 lb

## 2012-03-18 DIAGNOSIS — R110 Nausea: Secondary | ICD-10-CM

## 2012-03-18 DIAGNOSIS — Z23 Encounter for immunization: Secondary | ICD-10-CM

## 2012-03-18 LAB — COMPREHENSIVE METABOLIC PANEL
ALT: 13 U/L (ref 0–35)
AST: 20 U/L (ref 0–37)
Alkaline Phosphatase: 79 U/L (ref 39–117)
Creatinine, Ser: 0.8 mg/dL (ref 0.4–1.2)
Sodium: 130 mEq/L — ABNORMAL LOW (ref 135–145)
Total Bilirubin: 0.7 mg/dL (ref 0.3–1.2)
Total Protein: 6.8 g/dL (ref 6.0–8.3)

## 2012-03-18 LAB — CBC WITH DIFFERENTIAL/PLATELET
Basophils Absolute: 0 10*3/uL (ref 0.0–0.1)
HCT: 34.4 % — ABNORMAL LOW (ref 36.0–46.0)
Hemoglobin: 11.3 g/dL — ABNORMAL LOW (ref 12.0–15.0)
Lymphs Abs: 2.1 10*3/uL (ref 0.7–4.0)
MCV: 99.6 fl (ref 78.0–100.0)
Monocytes Absolute: 0.3 10*3/uL (ref 0.1–1.0)
Neutro Abs: 2.5 10*3/uL (ref 1.4–7.7)
Platelets: 120 10*3/uL — ABNORMAL LOW (ref 150.0–400.0)
RDW: 13.7 % (ref 11.5–14.6)

## 2012-03-18 LAB — H. PYLORI ANTIBODY, IGG: H Pylori IgG: NEGATIVE

## 2012-03-18 MED ORDER — DEXLANSOPRAZOLE 60 MG PO CPDR: 60.0000 mg | DELAYED_RELEASE_CAPSULE | Freq: Every day | ORAL | Status: DC

## 2012-03-18 NOTE — Patient Instructions (Addendum)
We will start a medication for your stomach called Dexilant and check you for H Pylori  If your nausea is not relieved with Dexilant,  We will evaluate your gallbladder.  If the gallbaldder appears normal,  And the nausea continues, we will have Dr Evette Cristal look with endoscopy.

## 2012-03-18 NOTE — Progress Notes (Signed)
Patient ID: Alicia Moran, female   DOB: June 01, 1929, 79 y.o.   MRN: [id number]  Patient Active Problem List  Diagnosis  . Atrial fibrillation  . Stroke  . Migraine headache  . Hyperlipidemia  . Encounter for long-term (current) use of anticoagulants  . Diplopia  . Cyst of skin and subcutaneous tissue  . Postural dizziness  . Screening for colon cancer  . Mid-back pain, acute  . Nausea in adult    Subjective:  CC:   Chief Complaint  Patient presents with  . Nausea    constant x 2 weeks, worse in the morning and at night; FLU SHOT    HPI:   Alicia Moran a 78 y.o. female who presents 2 week history of recurrent nausea , worse in the mornings and evenings after her evening meal which is light , usually cereal or toast. Her main meal is lunch and she is not usually nauseated after that.She wakes up with a bad taste in her mouth and nausea usually accompanied by headache. Main meal is lunch.  Told she snores.  Husband has sleep apnea and sleeps in other room.  Started taking pepcid at onset,  No significant relief, including gas x ,  No constipation.    Past Medical History  Diagnosis Date  . Atrial fibrillation   . Migraine headache   . Hyperlipidemia   . Stroke     History reviewed. No pertinent past surgical history.       The following portions of the patient's history were reviewed and updated as appropriate: Allergies, current medications, and problem list.    Review of Systems:   12 Pt  review of systems was negative except for nausea, and headache as addressed in the HPI,     History   Social History  . Marital Status: Married    Spouse Name: N/A    Number of Children: N/A  . Years of Education: N/A   Occupational History  . Not on file.   Social History Main Topics  . Smoking status: Former Smoker    Quit date: 03/29/1986  . Smokeless tobacco: Never Used  . Alcohol Use: No  . Drug Use: No  . Sexually Active: Not on file   Other  Topics Concern  . Not on file   Social History Narrative  . No narrative on file    Objective:  BP 127/74  Pulse 65  Temp 97.5 F (36.4 C) (Oral)  Resp 16  Ht 5\' 5"  (1.651 m)  Wt 138 lb (62.596 kg)  BMI 22.96 kg/m2  SpO2 99%  General appearance: alert, cooperative and appears stated age Neck: no adenopathy, no carotid bruit, supple, symmetrical, trachea midline and thyroid not enlarged, symmetric, no tenderness/mass/nodules Back: symmetric, no curvature. ROM normal. No CVA tenderness. Lungs: clear to auscultation bilaterally Heart: regular rate and rhythm, S1, S2 normal, no murmur, click, rub or gallop Abdomen: soft, tender in mid epigastric area and RUQ, no rebound or guarding; bowel sounds normal; no masses,  no organomegaly Pulses: 2+ and symmetric Skin: Skin color, texture, turgor normal. No rashes or lesions Lymph nodes: Cervical, supraclavicular, and axillary nodes normal.  Assessment and Plan:  Nausea in adult Etiology at this point is unclear. She is tender in the epigastric area but also in the right upper quadrant. We'll check liver enzymes and lipase today along with H. pylori antigen and start her on Dexilant for PPI. If her pain has not improved in 2 weeks we will order an  ultrasound of the abdomen. If Her pain is improved we'll keep her on a PPI. Given the frequent occurrence of headache in the morning as well we need to consider sleep apnea as a cause.   Updated Medication List Outpatient Encounter Prescriptions as of 03/18/2012  Medication Sig Dispense Refill  . famotidine (PEPCID) 40 MG tablet Take 40 mg by mouth daily.      . metoprolol succinate (TOPROL XL) 25 MG 24 hr tablet Take 1 tablet (25 mg total) by mouth daily.  90 tablet  3  . simvastatin (ZOCOR) 10 MG tablet Take 1 tablet (10 mg total) by mouth at bedtime.  90 tablet  3  . warfarin (COUMADIN) 4 MG tablet Take 1 tablet (4 mg total) by mouth daily.  30 tablet  5  . dexlansoprazole (DEXILANT) 60 MG  capsule Take 1 capsule (60 mg total) by mouth daily.  15 capsule  0  . DISCONTD: omeprazole (PRILOSEC) 40 MG capsule Take 1 capsule (40 mg total) by mouth daily.  30 capsule  3     Orders Placed This Encounter  Procedures  . Comprehensive metabolic panel  . CBC with Differential  . H. pylori antibody, IgG  . Lipase    No Follow-up on file.

## 2012-03-19 ENCOUNTER — Encounter: Payer: Self-pay | Admitting: Internal Medicine

## 2012-03-19 DIAGNOSIS — R110 Nausea: Secondary | ICD-10-CM | POA: Insufficient documentation

## 2012-03-19 NOTE — Assessment & Plan Note (Addendum)
Etiology at this point is unclear. She is tender in the epigastric area but also in the right upper quadrant. We'll check liver enzymes and lipase today along with H. pylori antigen and start her on Dexilant for PPI. If her pain has not improved in 2 weeks we will order an ultrasound of the abdomen. If Her pain is improved we'll keep her on a PPI. Given the frequent occurrence of headache in the morning as well we need to consider sleep apnea as a cause.

## 2012-03-21 NOTE — Progress Notes (Signed)
Quick Note:  Patient Informed and voiced understanding ______ 

## 2012-03-25 ENCOUNTER — Encounter: Payer: Medicare Other | Admitting: Internal Medicine

## 2012-04-10 ENCOUNTER — Other Ambulatory Visit (INDEPENDENT_AMBULATORY_CARE_PROVIDER_SITE_OTHER): Payer: Medicare Other

## 2012-04-10 DIAGNOSIS — Z7901 Long term (current) use of anticoagulants: Secondary | ICD-10-CM

## 2012-04-10 DIAGNOSIS — Z79899 Other long term (current) drug therapy: Secondary | ICD-10-CM

## 2012-04-10 LAB — PROTIME-INR: INR: 2.9 ratio — ABNORMAL HIGH (ref 0.8–1.0)

## 2012-04-24 ENCOUNTER — Telehealth: Payer: Self-pay

## 2012-04-24 MED ORDER — ALPRAZOLAM 0.25 MG PO TABS: 0.2500 mg | ORAL_TABLET | Freq: Every day | ORAL | Status: DC

## 2012-04-24 NOTE — Telephone Encounter (Signed)
Refill request from [facility] pharmacy for Alprazolam 0.25 mg # 30 ok to refill?

## 2012-04-24 NOTE — Telephone Encounter (Signed)
Yes  refill auth ir epic

## 2012-04-24 NOTE — Telephone Encounter (Signed)
Xanax 0.25 faxed to [facility] pharmacy [phone]

## 2012-04-25 DIAGNOSIS — H259 Unspecified age-related cataract: Secondary | ICD-10-CM | POA: Insufficient documentation

## 2012-04-25 DIAGNOSIS — Z961 Presence of intraocular lens: Secondary | ICD-10-CM | POA: Insufficient documentation

## 2012-05-09 ENCOUNTER — Other Ambulatory Visit (INDEPENDENT_AMBULATORY_CARE_PROVIDER_SITE_OTHER): Payer: Medicare Other

## 2012-05-09 DIAGNOSIS — Z7901 Long term (current) use of anticoagulants: Secondary | ICD-10-CM

## 2012-05-09 LAB — PROTIME-INR: Prothrombin Time: 25.4 s — ABNORMAL HIGH (ref 10.2–12.4)

## 2012-06-06 ENCOUNTER — Other Ambulatory Visit: Payer: Medicare Other

## 2012-06-06 ENCOUNTER — Other Ambulatory Visit (INDEPENDENT_AMBULATORY_CARE_PROVIDER_SITE_OTHER): Payer: Medicare Other

## 2012-06-06 DIAGNOSIS — Z7901 Long term (current) use of anticoagulants: Secondary | ICD-10-CM

## 2012-06-06 LAB — PROTIME-INR
INR: 2.2 ratio — ABNORMAL HIGH (ref 0.8–1.0)
Prothrombin Time: 23.1 s — ABNORMAL HIGH (ref 10.2–12.4)

## 2012-07-07 ENCOUNTER — Other Ambulatory Visit (INDEPENDENT_AMBULATORY_CARE_PROVIDER_SITE_OTHER): Payer: Medicare Other

## 2012-07-07 DIAGNOSIS — Z7901 Long term (current) use of anticoagulants: Secondary | ICD-10-CM

## 2012-07-07 LAB — PROTIME-INR: Prothrombin Time: 21.9 s — ABNORMAL HIGH (ref 10.2–12.4)

## 2012-08-06 ENCOUNTER — Other Ambulatory Visit (INDEPENDENT_AMBULATORY_CARE_PROVIDER_SITE_OTHER): Payer: Medicare Other

## 2012-08-06 DIAGNOSIS — Z7901 Long term (current) use of anticoagulants: Secondary | ICD-10-CM

## 2012-08-06 LAB — PROTIME-INR
INR: 1.6 ratio — ABNORMAL HIGH (ref 0.8–1.0)
Prothrombin Time: 16.9 s — ABNORMAL HIGH (ref 10.2–12.4)

## 2012-08-20 ENCOUNTER — Other Ambulatory Visit (INDEPENDENT_AMBULATORY_CARE_PROVIDER_SITE_OTHER): Payer: Medicare Other

## 2012-08-20 DIAGNOSIS — Z7901 Long term (current) use of anticoagulants: Secondary | ICD-10-CM

## 2012-08-26 ENCOUNTER — Ambulatory Visit: Payer: Medicare Other | Admitting: Internal Medicine

## 2012-09-05 ENCOUNTER — Other Ambulatory Visit (INDEPENDENT_AMBULATORY_CARE_PROVIDER_SITE_OTHER): Payer: Medicare Other

## 2012-09-05 ENCOUNTER — Ambulatory Visit (INDEPENDENT_AMBULATORY_CARE_PROVIDER_SITE_OTHER): Payer: Medicare Other | Admitting: Internal Medicine

## 2012-09-05 ENCOUNTER — Encounter: Payer: Self-pay | Admitting: Internal Medicine

## 2012-09-05 VITALS — BP 132/80 | HR 63 | Temp 98.3°F | Resp 16 | Wt 141.5 lb

## 2012-09-05 DIAGNOSIS — R350 Frequency of micturition: Secondary | ICD-10-CM

## 2012-09-05 DIAGNOSIS — Z7901 Long term (current) use of anticoagulants: Secondary | ICD-10-CM

## 2012-09-05 DIAGNOSIS — Z1211 Encounter for screening for malignant neoplasm of colon: Secondary | ICD-10-CM

## 2012-09-05 LAB — PROTIME-INR: Prothrombin Time: 38 s — ABNORMAL HIGH (ref 10.2–12.4)

## 2012-09-05 LAB — POCT URINALYSIS DIPSTICK
Glucose, UA: NEGATIVE
Ketones, UA: NEGATIVE
Protein, UA: NEGATIVE
Urobilinogen, UA: 0.2

## 2012-09-05 NOTE — Patient Instructions (Addendum)
You can use miralax for constipation .  Take it at 8 Pm to avoid interference with other meds.  Can take daily or as needed  Try simply saline to flush your sinuses of bacteria and viruses   We need to  check your sodium level and your choelsterol nextt ime you have your coumadin level checked (one month)so make a morning appt

## 2012-09-06 LAB — URINE CULTURE
Colony Count: NO GROWTH
Organism ID, Bacteria: NO GROWTH

## 2012-09-07 ENCOUNTER — Encounter: Payer: Self-pay | Admitting: Internal Medicine

## 2012-09-07 DIAGNOSIS — R350 Frequency of micturition: Secondary | ICD-10-CM | POA: Insufficient documentation

## 2012-09-07 NOTE — Progress Notes (Signed)
Patient ID: Alicia Moran, female   DOB: 1929-01-23, 79 y.o.   MRN: [id number]  Patient Active Problem List  Diagnosis  . Atrial fibrillation  . Stroke  . Migraine headache  . Hyperlipidemia  . Long term (current) use of anticoagulants  . Screening for colon cancer  . Urinary frequency    Subjective:  CC:   Chief Complaint  Patient presents with  . Follow-up    HPI:   Alicia Moran a 79 y.o. female who presents for followup on chronic conditions. She feels well except for some increased urination lately. Her daughter who is a source of most of her anxiety I moved out unexpectedly with no forwarding address after a week and her birthday celebrations. Her husband's dementia is becoming worse and she is expressing some caregiver fatigue. She has insomnia once or twice  Per week.  Her appetite is good. She has no new or specific joint pains. Her weight has been stable.   Past Medical History  Diagnosis Date  . Atrial fibrillation   . Migraine headache   . Hyperlipidemia   . Stroke     History reviewed. No pertinent past surgical history.     The following portions of the patient's history were reviewed and updated as appropriate: Allergies, current medications, and problem list.    Review of Systems:   Patient denies headache, fevers, malaise, unintentional weight loss, skin rash, eye pain, sinus congestion and sinus pain, sore throat, dysphagia,  hemoptysis , cough, dyspnea, wheezing, chest pain, palpitations, orthopnea, edema, abdominal pain, nausea, melena, diarrhea, constipation, flank pain, dysuria, hematuria, urinary  Frequency, nocturia, numbness, tingling, seizures,  Focal weakness, Loss of consciousness,  Tremor, insomnia, depression, anxiety, and suicidal ideation.      History   Social History  . Marital Status: Married    Spouse Name: N/A    Number of Children: N/A  . Years of Education: N/A   Occupational History  . Not on file.   Social  History Main Topics  . Smoking status: Former Smoker    Quit date: 03/29/1986  . Smokeless tobacco: Never Used  . Alcohol Use: No  . Drug Use: No  . Sexually Active: Not on file   Other Topics Concern  . Not on file   Social History Narrative  . No narrative on file    Objective:  BP 132/80  Pulse 63  Temp(Src) 98.3 F (36.8 C) (Oral)  Resp 16  Wt 141 lb 8 oz (64.184 kg)  BMI 23.55 kg/m2  SpO2 98%  General appearance: alert, cooperative and appears stated age Ears: normal TM's and external ear canals both ears Throat: lips, mucosa, and tongue normal; teeth and gums normal Neck: no adenopathy, no carotid bruit, supple, symmetrical, trachea midline and thyroid not enlarged, symmetric, no tenderness/mass/nodules Back: symmetric, no curvature. ROM normal. No CVA tenderness. Lungs: clear to auscultation bilaterally Heart: regular rate and rhythm, S1, S2 normal, no murmur, click, rub or gallop Abdomen: soft, non-tender; bowel sounds normal; no masses,  no organomegaly Pulses: 2+ and symmetric Skin: Skin color, texture, turgor normal. No rashes or lesions Lymph nodes: Cervical, supraclavicular, and axillary nodes normal.  Assessment and Plan:  Long term (current) use of anticoagulants Secondary to chronic atrial fibrillation. Managed with monthly INRs. INR today was elevated at 3.7. I will have her hold her dose for 2 days then resume at 3 mg daily. We will repeat her INR in 2 weeks.  She has been postponing eye surgery due to  reluctance to stop her anticoagulation for fear stroke. We discussed the alternative which would be to put her on low molecular weight heparin four-week surrounding her surgery. She is satisfied with this plan and will contact me when she schedules her surgery.   Urinary frequency A urinalysis was done today and was trace abnormal. Given the mildness of her symptoms and the fact of antibiotics on her Coumadin therapy I have opted to suspend his treatment  until the urine culture results are obtained.   Updated Medication List Outpatient Encounter Prescriptions as of 09/05/2012  Medication Sig Dispense Refill  . ALPRAZolam (XANAX) 0.25 MG tablet Take 1 tablet (0.25 mg total) by mouth daily. As needed for anxiety or insomnia  30 tablet  3  . metoprolol succinate (TOPROL XL) 25 MG 24 hr tablet Take 1 tablet (25 mg total) by mouth daily.  90 tablet  3  . simvastatin (ZOCOR) 10 MG tablet Take 1 tablet (10 mg total) by mouth at bedtime.  90 tablet  3  . warfarin (COUMADIN) 4 MG tablet Take 1 tablet (4 mg total) by mouth daily.  30 tablet  5  . dexlansoprazole (DEXILANT) 60 MG capsule Take 1 capsule (60 mg total) by mouth daily.  15 capsule  0  . famotidine (PEPCID) 40 MG tablet Take 40 mg by mouth daily.       No facility-administered encounter medications on file as of 09/05/2012.     Orders Placed This Encounter  Procedures  . Urine culture  . Fecal Occult Blood, Guaiac  . POCT Urinalysis Dipstick  . POC Hemoccult Bld/Stl (3-Cd Home Screen)    No Follow-up on file.

## 2012-09-07 NOTE — Assessment & Plan Note (Addendum)
Secondary to chronic atrial fibrillation. Managed with monthly INRs. INR today was elevated at 3.7. I will have her hold her dose for 2 days then resume at 3 mg daily. We will repeat her INR in 2 weeks.  She has been postponing eye surgery due to reluctance to stop her anticoagulation for fear stroke. We discussed the alternative which would be to put her on low molecular weight heparin four-week surrounding her surgery. She is satisfied with this plan and will contact me when she schedules her surgery.

## 2012-09-07 NOTE — Assessment & Plan Note (Signed)
A urinalysis was done today and was trace abnormal. Given the mildness of her symptoms and the fact of antibiotics on her Coumadin therapy I have opted to suspend his treatment until the urine culture results are obtained.

## 2012-09-10 ENCOUNTER — Telehealth: Payer: Self-pay | Admitting: General Practice

## 2012-09-10 MED ORDER — WARFARIN SODIUM 3 MG PO TABS: 3.0000 mg | ORAL_TABLET | Freq: Every day | ORAL | Status: DC

## 2012-09-10 NOTE — Telephone Encounter (Signed)
Pt calling back regarding last INR. Sent message back to you under lab result regardig her coumadin dosage of 4mg  daily and 6mg  on Sunday. Please advise the dosage you would like her on.

## 2012-09-10 NOTE — Telephone Encounter (Signed)
3 mg daily    Is new coumadin dose.  Repeat INR 2 weeks

## 2012-09-11 NOTE — Telephone Encounter (Signed)
Pt.notified

## 2012-09-23 ENCOUNTER — Other Ambulatory Visit: Payer: Self-pay | Admitting: *Deleted

## 2012-09-23 ENCOUNTER — Telehealth: Payer: Self-pay | Admitting: Internal Medicine

## 2012-09-23 DIAGNOSIS — Z7901 Long term (current) use of anticoagulants: Secondary | ICD-10-CM

## 2012-09-24 ENCOUNTER — Other Ambulatory Visit: Payer: Medicare Other

## 2012-09-24 ENCOUNTER — Other Ambulatory Visit (INDEPENDENT_AMBULATORY_CARE_PROVIDER_SITE_OTHER): Payer: Medicare Other

## 2012-09-24 DIAGNOSIS — Z7901 Long term (current) use of anticoagulants: Secondary | ICD-10-CM

## 2012-09-24 LAB — PROTIME-INR: INR: 1.5 ratio — ABNORMAL HIGH (ref 0.8–1.0)

## 2012-09-25 ENCOUNTER — Telehealth: Payer: Self-pay | Admitting: Internal Medicine

## 2012-09-25 NOTE — Telephone Encounter (Signed)
Caller: Alicia Moran/Patient; Phone: [phone]; Reason for Call: INR was low 09/24/12 but has not heard back about dosing for 09/25/12.  Takes 3mg  daily at lunchtime, and has taken 3mg  09/25/12.  Per Epic, patient had called AM 09/25/12; Dr.  Darrick Huntsman was advised that patient is taking 3mg  daily.  INR 1.  5 09/24/12.  Info to office for staff/provider review/callback.  May reach patient at [phone].  Krs/can

## 2012-09-25 NOTE — Telephone Encounter (Signed)
Pt was informed that I have not received any changes from Dr. Darrick Huntsman yet. Will call as soon as possible.

## 2012-09-25 NOTE — Telephone Encounter (Signed)
Tae 6 mg tonight and Saturdays,  3 mg all other days.  Repeat INr in two week s

## 2012-09-26 ENCOUNTER — Other Ambulatory Visit (INDEPENDENT_AMBULATORY_CARE_PROVIDER_SITE_OTHER): Payer: Medicare Other

## 2012-09-26 DIAGNOSIS — Z1211 Encounter for screening for malignant neoplasm of colon: Secondary | ICD-10-CM

## 2012-09-26 NOTE — Telephone Encounter (Signed)
Pt.notified

## 2012-09-29 ENCOUNTER — Other Ambulatory Visit: Payer: Self-pay | Admitting: *Deleted

## 2012-09-29 DIAGNOSIS — Z1211 Encounter for screening for malignant neoplasm of colon: Secondary | ICD-10-CM

## 2012-09-29 LAB — FECAL OCCULT BLOOD, IMMUNOCHEMICAL: Fecal Occult Bld: NEGATIVE

## 2012-09-30 ENCOUNTER — Encounter: Payer: Self-pay | Admitting: General Practice

## 2012-10-08 ENCOUNTER — Telehealth: Payer: Self-pay | Admitting: Internal Medicine

## 2012-10-08 DIAGNOSIS — Z79899 Other long term (current) drug therapy: Secondary | ICD-10-CM

## 2012-10-08 DIAGNOSIS — E785 Hyperlipidemia, unspecified: Secondary | ICD-10-CM

## 2012-10-08 DIAGNOSIS — Z7901 Long term (current) use of anticoagulants: Secondary | ICD-10-CM

## 2012-10-08 NOTE — Telephone Encounter (Signed)
Pt found pprwork re lab work, says she needed coumadin, cholesterol and sodium checked. Pt has appt 4/25 @ 10:45 a.m.

## 2012-10-08 NOTE — Telephone Encounter (Signed)
Pt needing to know if she has to fast for her coumadin appt on Friday.  Also do not see orders in for coumadin.  Pt is scheduled Friday at 10:45

## 2012-10-08 NOTE — Telephone Encounter (Signed)
Thanks, labs ordered.

## 2012-10-08 NOTE — Telephone Encounter (Signed)
Patient notified of fasting labs for 10/09/12

## 2012-10-10 ENCOUNTER — Other Ambulatory Visit (INDEPENDENT_AMBULATORY_CARE_PROVIDER_SITE_OTHER): Payer: Medicare Other

## 2012-10-10 DIAGNOSIS — Z79899 Other long term (current) drug therapy: Secondary | ICD-10-CM

## 2012-10-10 DIAGNOSIS — Z7901 Long term (current) use of anticoagulants: Secondary | ICD-10-CM

## 2012-10-10 DIAGNOSIS — E785 Hyperlipidemia, unspecified: Secondary | ICD-10-CM

## 2012-10-10 LAB — COMPREHENSIVE METABOLIC PANEL
Albumin: 4 g/dL (ref 3.5–5.2)
CO2: 30 mEq/L (ref 19–32)
Calcium: 9 mg/dL (ref 8.4–10.5)
Chloride: 102 mEq/L (ref 96–112)
GFR: 73.04 mL/min (ref >60.00)
Glucose, Bld: 88 mg/dL (ref 70–99)
Potassium: 4.2 mEq/L (ref 3.5–5.1)
Sodium: 136 mEq/L (ref 135–145)
Total Bilirubin: 0.8 mg/dL (ref 0.3–1.2)
Total Protein: 6.8 g/dL (ref 6.0–8.3)

## 2012-10-10 LAB — PROTIME-INR
INR: 1.6 ratio — ABNORMAL HIGH (ref 0.8–1.0)
Prothrombin Time: 16.4 s — ABNORMAL HIGH (ref 10.2–12.4)

## 2012-10-10 LAB — LIPID PANEL: HDL: 53.9 mg/dL (ref >39.00)

## 2012-10-13 ENCOUNTER — Telehealth: Payer: Self-pay | Admitting: Internal Medicine

## 2012-10-23 ENCOUNTER — Other Ambulatory Visit (INDEPENDENT_AMBULATORY_CARE_PROVIDER_SITE_OTHER): Payer: Medicare Other

## 2012-10-23 DIAGNOSIS — Z7901 Long term (current) use of anticoagulants: Secondary | ICD-10-CM

## 2012-10-23 LAB — PROTIME-INR: Prothrombin Time: 24.3 s — ABNORMAL HIGH (ref 10.2–12.4)

## 2012-11-04 ENCOUNTER — Other Ambulatory Visit: Payer: Self-pay | Admitting: *Deleted

## 2012-11-05 ENCOUNTER — Other Ambulatory Visit: Payer: Self-pay | Admitting: *Deleted

## 2012-11-05 MED ORDER — WARFARIN SODIUM 3 MG PO TABS: 3.0000 mg | ORAL_TABLET | Freq: Every day | ORAL | Status: DC

## 2012-11-05 NOTE — Telephone Encounter (Signed)
Pharmacy Note:  Patient said directions have changed, please re-send Rx

## 2012-11-05 NOTE — Telephone Encounter (Signed)
Rx sent to pharmacy by escript  

## 2012-11-07 MED ORDER — WARFARIN SODIUM 3 MG PO TABS: 3.0000 mg | ORAL_TABLET | Freq: Every day | ORAL | Status: DC

## 2012-11-13 ENCOUNTER — Other Ambulatory Visit: Payer: Self-pay | Admitting: Internal Medicine

## 2012-11-24 ENCOUNTER — Other Ambulatory Visit (INDEPENDENT_AMBULATORY_CARE_PROVIDER_SITE_OTHER): Payer: Medicare Other

## 2012-11-24 ENCOUNTER — Telehealth: Payer: Self-pay | Admitting: Internal Medicine

## 2012-11-24 DIAGNOSIS — Z7901 Long term (current) use of anticoagulants: Secondary | ICD-10-CM

## 2012-11-24 LAB — PROTIME-INR
INR: 3.5 ratio — ABNORMAL HIGH (ref 0.8–1.0)
Prothrombin Time: 35.7 s — ABNORMAL HIGH (ref 10.2–12.4)

## 2012-11-24 NOTE — Telephone Encounter (Signed)
FYI  Pt stated she went and had a mole removed with dr Roseanne Kaufman.  Pt stated she woke with with blood all over the sheets.  She went back to the dermatologist  office on Friday and they dessicat the spot.  Pt stated they got it stopped the bleeding at dr office.  Pt is having her protime checked today.

## 2012-11-25 ENCOUNTER — Telehealth: Payer: Self-pay | Admitting: Internal Medicine

## 2012-11-25 DIAGNOSIS — Z1239 Encounter for other screening for malignant neoplasm of breast: Secondary | ICD-10-CM

## 2012-11-25 NOTE — Telephone Encounter (Addendum)
Needing a mammogram order for Alicia Moran . Patient is aware of her appointment on 7.10.14. @ 1:00.

## 2012-11-26 NOTE — Telephone Encounter (Signed)
Ordered

## 2012-12-10 ENCOUNTER — Other Ambulatory Visit (INDEPENDENT_AMBULATORY_CARE_PROVIDER_SITE_OTHER): Payer: Medicare Other

## 2012-12-10 DIAGNOSIS — Z7901 Long term (current) use of anticoagulants: Secondary | ICD-10-CM

## 2012-12-10 LAB — PROTIME-INR: INR: 1.9 ratio — ABNORMAL HIGH (ref 0.8–1.0)

## 2013-01-08 ENCOUNTER — Other Ambulatory Visit (INDEPENDENT_AMBULATORY_CARE_PROVIDER_SITE_OTHER): Payer: Medicare Other

## 2013-01-08 ENCOUNTER — Telehealth: Payer: Self-pay | Admitting: Internal Medicine

## 2013-01-08 DIAGNOSIS — Z7901 Long term (current) use of anticoagulants: Secondary | ICD-10-CM

## 2013-01-08 LAB — PROTIME-INR: Prothrombin Time: 17.4 s — ABNORMAL HIGH (ref 10.2–12.4)

## 2013-01-08 NOTE — Telephone Encounter (Signed)
Certainly we can change her regimen to 4 mg alternating with 3 mg  .pending todays' results she can take 4 mg 3 days per week and 3 mg 4 days per wee

## 2013-01-08 NOTE — Telephone Encounter (Signed)
Pt left message for dr Darrick Huntsman   In box

## 2013-01-08 NOTE — Telephone Encounter (Signed)
In red folder. 

## 2013-01-09 MED ORDER — WARFARIN SODIUM 4 MG PO TABS: 4.0000 mg | ORAL_TABLET | Freq: Every day | ORAL | Status: DC

## 2013-01-09 NOTE — Telephone Encounter (Signed)
Dr. Melina Schools result note :"Her pro time is a little bit low. I would like to change her dose to 4 mg daily and have her repeat an INR in 2 weeks." Pt notified, will call back to schedule repeat Pt/INR. Pt states she needs a refill on her Coumadin 4 mg. Rx sent to pharmacy by escript

## 2013-01-13 ENCOUNTER — Ambulatory Visit: Payer: Self-pay | Admitting: Internal Medicine

## 2013-01-18 ENCOUNTER — Inpatient Hospital Stay: Payer: Self-pay | Admitting: Internal Medicine

## 2013-01-18 LAB — URINALYSIS, COMPLETE
Bacteria: NONE SEEN
Blood: NEGATIVE
Glucose,UR: NEGATIVE mg/dL (ref 0–75)
Ketone: NEGATIVE
Leukocyte Esterase: NEGATIVE
Ph: 5 (ref 4.5–8.0)
Protein: NEGATIVE
Specific Gravity: 1.017 (ref 1.003–1.030)

## 2013-01-18 LAB — CBC WITH DIFFERENTIAL/PLATELET
Basophil #: 0 10*3/uL (ref 0.0–0.1)
Basophil %: 0.3 %
Eosinophil #: 0.1 10*3/uL (ref 0.0–0.7)
HCT: 31.1 % — ABNORMAL LOW (ref 35.0–47.0)
HGB: 10.8 g/dL — ABNORMAL LOW (ref 12.0–16.0)
Lymphocyte %: 16.4 %
MCH: 33.1 pg (ref 26.0–34.0)
Monocyte #: 0.5 x10 3/mm (ref 0.2–0.9)
Monocyte %: 4.4 %
Platelet: 113 10*3/uL — ABNORMAL LOW (ref 150–440)
RBC: 3.27 10*6/uL — ABNORMAL LOW (ref 3.80–5.20)
RDW: 13 % (ref 11.5–14.5)
WBC: 11.1 10*3/uL — ABNORMAL HIGH (ref 3.6–11.0)

## 2013-01-18 LAB — COMPREHENSIVE METABOLIC PANEL
Alkaline Phosphatase: 92 U/L (ref 50–136)
Anion Gap: 6 — ABNORMAL LOW (ref 7–16)
Chloride: 104 mmol/L (ref 98–107)
Creatinine: 0.82 mg/dL (ref 0.60–1.30)
Glucose: 119 mg/dL — ABNORMAL HIGH (ref 65–99)
Osmolality: 278 (ref 275–301)
Potassium: 4.1 mmol/L (ref 3.5–5.1)
SGPT (ALT): 20 U/L (ref 12–78)
Total Protein: 6.5 g/dL (ref 6.4–8.2)

## 2013-01-18 LAB — APTT: Activated PTT: 30.3 secs (ref 23.6–35.9)

## 2013-01-19 LAB — CBC WITH DIFFERENTIAL/PLATELET
Basophil #: 0.1 10*3/uL (ref 0.0–0.1)
Basophil %: 0.5 %
Basophil %: 0.5 %
Eosinophil #: 0.1 10*3/uL (ref 0.0–0.7)
Eosinophil %: 0.9 %
HCT: 20.5 % — ABNORMAL LOW (ref 35.0–47.0)
HGB: 10.1 g/dL — ABNORMAL LOW (ref 12.0–16.0)
HGB: 7 g/dL — ABNORMAL LOW (ref 12.0–16.0)
Lymphocyte #: 2.7 10*3/uL (ref 1.0–3.6)
Lymphocyte #: 3.3 10*3/uL (ref 1.0–3.6)
MCH: 32.9 pg (ref 26.0–34.0)
MCHC: 35.5 g/dL (ref 32.0–36.0)
MCV: 95 fL (ref 80–100)
MCV: 96 fL (ref 80–100)
Neutrophil #: 7.4 10*3/uL — ABNORMAL HIGH (ref 1.4–6.5)
Neutrophil %: 53.8 %
Platelet: 100 10*3/uL — ABNORMAL LOW (ref 150–440)
Platelet: 90 10*3/uL — ABNORMAL LOW (ref 150–440)
RDW: 13.4 % (ref 11.5–14.5)
RDW: 13.2 % (ref 11.5–14.5)
WBC: 7.4 10*3/uL (ref 3.6–11.0)
WBC: 11.6 10*3/uL — ABNORMAL HIGH (ref 3.6–11.0)

## 2013-01-19 LAB — BASIC METABOLIC PANEL
Anion Gap: 4 — ABNORMAL LOW (ref 7–16)
Chloride: 103 mmol/L (ref 98–107)
Creatinine: 0.9 mg/dL (ref 0.60–1.30)
EGFR (Non-African Amer.): 60 — ABNORMAL LOW
Osmolality: 275 (ref 275–301)

## 2013-01-19 LAB — MAGNESIUM: Magnesium: 1.8 mg/dL

## 2013-01-19 LAB — APTT: Activated PTT: 32 secs (ref 23.6–35.9)

## 2013-01-19 LAB — PROTIME-INR: INR: 1.7

## 2013-01-20 ENCOUNTER — Encounter: Payer: Self-pay | Admitting: Internal Medicine

## 2013-01-20 LAB — CBC WITH DIFFERENTIAL/PLATELET
Basophil %: 0.6 %
Eosinophil #: 0.2 10*3/uL (ref 0.0–0.7)
Eosinophil %: 2.8 %
HCT: 24.8 % — ABNORMAL LOW (ref 35.0–47.0)
Lymphocyte #: 2.8 10*3/uL (ref 1.0–3.6)
Lymphocyte %: 38 %
MCH: 33.2 pg (ref 26.0–34.0)
MCV: 94 fL (ref 80–100)
Platelet: 66 10*3/uL — ABNORMAL LOW (ref 150–440)
RBC: 2.64 10*6/uL — ABNORMAL LOW (ref 3.80–5.20)
RDW: 13.7 % (ref 11.5–14.5)

## 2013-01-20 LAB — APTT: Activated PTT: 31.3 secs (ref 23.6–35.9)

## 2013-01-20 LAB — BASIC METABOLIC PANEL
Anion Gap: 2 — ABNORMAL LOW (ref 7–16)
BUN: 21 mg/dL — ABNORMAL HIGH (ref 7–18)
Chloride: 102 mmol/L (ref 98–107)
Co2: 34 mmol/L — ABNORMAL HIGH (ref 21–32)
EGFR (African American): 53 — ABNORMAL LOW
EGFR (Non-African Amer.): 46 — ABNORMAL LOW
Glucose: 97 mg/dL (ref 65–99)
Osmolality: 279 (ref 275–301)
Sodium: 138 mmol/L (ref 136–145)

## 2013-01-20 LAB — PROTIME-INR: INR: 1.4

## 2013-01-21 LAB — CBC WITH DIFFERENTIAL/PLATELET
Basophil #: 0.1 10*3/uL (ref 0.0–0.1)
Basophil %: 0.7 %
Eosinophil #: 0.3 10*3/uL (ref 0.0–0.7)
HCT: 23.1 % — ABNORMAL LOW (ref 35.0–47.0)
HGB: 8.2 g/dL — ABNORMAL LOW (ref 12.0–16.0)
Lymphocyte %: 30.5 %
MCH: 33.5 pg (ref 26.0–34.0)
MCHC: 35.4 g/dL (ref 32.0–36.0)
Monocyte #: 0.5 x10 3/mm (ref 0.2–0.9)
RBC: 2.44 10*6/uL — ABNORMAL LOW (ref 3.80–5.20)
WBC: 7.3 10*3/uL (ref 3.6–11.0)

## 2013-01-22 LAB — CBC WITH DIFFERENTIAL/PLATELET
Basophil #: 0.1 10*3/uL (ref 0.0–0.1)
Basophil %: 0.9 %
Eosinophil #: 0.2 10*3/uL (ref 0.0–0.7)
Lymphocyte #: 1.7 10*3/uL (ref 1.0–3.6)
MCH: 33.2 pg (ref 26.0–34.0)
Monocyte %: 6 %
Neutrophil #: 3.5 10*3/uL (ref 1.4–6.5)
Neutrophil %: 61.5 %
Platelet: 94 10*3/uL — ABNORMAL LOW (ref 150–440)
RBC: 2.17 10*6/uL — ABNORMAL LOW (ref 3.80–5.20)

## 2013-01-23 LAB — CBC WITH DIFFERENTIAL/PLATELET
Basophil %: 0.9 %
Eosinophil #: 0.3 10*3/uL (ref 0.0–0.7)
Eosinophil %: 4.5 %
HCT: 26.1 % — ABNORMAL LOW (ref 35.0–47.0)
Lymphocyte #: 3.6 10*3/uL (ref 1.0–3.6)
Lymphocyte %: 48.9 %
MCH: 32.9 pg (ref 26.0–34.0)
MCHC: 35 g/dL (ref 32.0–36.0)
MCV: 94 fL (ref 80–100)
Monocyte #: 0.6 x10 3/mm (ref 0.2–0.9)
Platelet: 113 10*3/uL — ABNORMAL LOW (ref 150–440)
RDW: 14.5 % (ref 11.5–14.5)
WBC: 7.3 10*3/uL (ref 3.6–11.0)

## 2013-01-24 LAB — PROTIME-INR: Prothrombin Time: 26.9 secs — ABNORMAL HIGH (ref 11.5–14.7)

## 2013-01-25 ENCOUNTER — Encounter: Payer: Self-pay | Admitting: Internal Medicine

## 2013-01-27 LAB — PROTIME-INR: INR: 4

## 2013-01-29 LAB — PROTIME-INR
INR: 2.4
Prothrombin Time: 25.3 secs — ABNORMAL HIGH (ref 11.5–14.7)

## 2013-02-02 ENCOUNTER — Telehealth: Payer: Self-pay | Admitting: Internal Medicine

## 2013-02-02 LAB — PROTIME-INR: INR: 1.3

## 2013-02-02 NOTE — Telephone Encounter (Signed)
The patient broke her hip and she is in Rehab.

## 2013-02-09 ENCOUNTER — Ambulatory Visit: Payer: Self-pay | Admitting: Internal Medicine

## 2013-02-10 LAB — PROTIME-INR
INR: 2.8
Prothrombin Time: 28.4 secs — ABNORMAL HIGH (ref 11.5–14.7)

## 2013-02-16 ENCOUNTER — Encounter: Payer: Self-pay | Admitting: Internal Medicine

## 2013-02-17 LAB — PROTIME-INR: INR: 4.3

## 2013-02-24 LAB — CLOSTRIDIUM DIFFICILE BY PCR

## 2013-02-27 ENCOUNTER — Telehealth: Payer: Self-pay | Admitting: Internal Medicine

## 2013-02-27 NOTE — Telephone Encounter (Signed)
Patient Information:  Caller Name: Atasha  Phone: [phone]  Patient: Alicia Moran  Gender: Female  DOB: [birth year]  Age: 79 Years  PCP: Duncan Dull (Adults only)  Office Follow Up:  Does the office need to follow up with this patient?: Yes  Instructions For The Office: [facility] office contact patient. She is clearly frustrated with "funny taste in mouth"...declined further triage. She has appt 03/04/13 and was calling originally to come into to the office today for evaluation and was transferred to triage nurse.  Patient states she was told no appt today...so "Ill just wait till Wednesday". Declined further triage.  RN Note:  Patient ended triage. She states she will just wait and see the physician next Wednesday. Offered triage and declined. Patient was clearly frustrated with taste in her mouth.  Did not want to continue triage.  Symptoms  Reason For Call & Symptoms: Patient states she was discharged yesterday from [facility] after fracture of right leg. She got C-diff in rehab and was started on Flagyl yesterday and wants to know if there is something faster?  (2) Funny Bad  taste in her mouth ongoing for several weeks and she cannot describe it. She was in rehab for 5 weeks and has lost  10 lbs. No blisters or sores.  She cannot taste what she is eating.   She has an appt on Wednesday for 03/04/13 for evaluation.  Reviewed Health History In EMR: N/A  Reviewed Medications In EMR: N/A  Reviewed Allergies In EMR: N/A  Reviewed Surgeries / Procedures: N/A  Date of Onset of Symptoms: 02/27/2013  Guideline(s) Used:  No Protocol Available - Sick Adult  Disposition Per Guideline:   Discuss with PCP and Callback by Nurse Today  Reason For Disposition Reached:   Nursing judgment  Advice Given:  Call Back If:  New symptoms develop  You become worse.  RN Overrode Recommendation:  Follow Up With Office Later  [facility] office contact patient. She is clearly frustrated with "funny  taste in mouth"...declined further triage. She has appt 03/04/13 and was calling originally to come into to the office today for evaluation and was transferred to triage nurse.  Patient states she was told no appt today...so "Ill just wait till Wednesday". Declined further triage.

## 2013-02-27 NOTE — Telephone Encounter (Signed)
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  k.  She can try breath mints,  Mouth wash.  And make sure the taste is not coming  from post nasal drip,  In which case flushing sinuses with Simply Saline may help.

## 2013-02-27 NOTE — Telephone Encounter (Signed)
PLEASE ADVISE.

## 2013-03-02 ENCOUNTER — Telehealth: Payer: Self-pay | Admitting: Internal Medicine

## 2013-03-02 NOTE — Telephone Encounter (Signed)
Need to know when next INR needs to be checked.  Checked Saturday, then checked again today.  Pt was recently released from rehab 9/11.  Has appt to see Dr. Darrick Huntsman on Wednesday 9/17.  [facility] results = INR 1.9, PT 23.0.  Please call to inform when next should be checked.  Also needs order for dosing of Coumadin.  Pt took 3 mg on Saturday and 2 mg on Sunday.  Her normal dose was 2 mg daily before she went in for rehab.

## 2013-03-02 NOTE — Telephone Encounter (Signed)
Olegario Messier calling again from [facility].  States they have not gotten a response from msg this morning.  States they need to know what dose of Coumadin to give pt.  Please call.

## 2013-03-03 NOTE — Telephone Encounter (Signed)
Talked with Olegario Messier [facility] nurse repeat pt-inr in one week.

## 2013-03-04 ENCOUNTER — Ambulatory Visit (INDEPENDENT_AMBULATORY_CARE_PROVIDER_SITE_OTHER): Payer: Medicare Other | Admitting: Internal Medicine

## 2013-03-04 ENCOUNTER — Encounter: Payer: Self-pay | Admitting: Internal Medicine

## 2013-03-04 VITALS — BP 128/72 | HR 97 | Temp 97.8°F | Resp 14 | Ht 65.0 in | Wt 129.0 lb

## 2013-03-04 DIAGNOSIS — S72141S Displaced intertrochanteric fracture of right femur, sequela: Secondary | ICD-10-CM

## 2013-03-04 DIAGNOSIS — Z7901 Long term (current) use of anticoagulants: Secondary | ICD-10-CM

## 2013-03-04 DIAGNOSIS — A0472 Enterocolitis due to Clostridium difficile, not specified as recurrent: Secondary | ICD-10-CM

## 2013-03-04 DIAGNOSIS — S72009S Fracture of unspecified part of neck of unspecified femur, sequela: Secondary | ICD-10-CM

## 2013-03-04 MED ORDER — CULTURELLE DIGESTIVE HEALTH PO CAPS: 1.0000 | ORAL_CAPSULE | Freq: Every day | ORAL | Status: DC

## 2013-03-04 NOTE — Patient Instructions (Signed)
We are repeating your stool tests to see if the c dificile is improving.    I also recommend that you start taking a "priobiotic"  Such as Culturelle,   Align or  Floraque (available at [facility])   You  Must continue the flagyl until you hear from me.

## 2013-03-04 NOTE — Progress Notes (Signed)
Patient ID: Alicia Moran, female   DOB: 03-23-1929, 79 y.o.   MRN: [id number]   Patient Active Problem List   Diagnosis Date Noted  . Enteritis due to Clostridium difficile 03/05/2013  . Intertrochanteric fracture of right hip 03/05/2013  . Urinary frequency 09/07/2012  . Screening for colon cancer 12/24/2011  . Long term (current) use of anticoagulants 04/01/2011  . Atrial fibrillation   . Stroke   . Migraine headache   . Hyperlipidemia     Subjective:  CC:   Chief Complaint  Patient presents with  . Follow-up    hospital and rehab, C/O no appetite    HPI:   Nassim Klepper a 79 y.o. female who presents with enterritis at Hospital follow up.   Patient was admitted to [facility] on August 3 after sustaining a nontraumatic right intertrochanteric hip fracture which occurred while at home. She received fresh frozen plasma due to chronic anticoagulation, followed by  surgery on August 4th by Dr Real Cons and was sent to [facility] to rehab. While she was at [facility] she developed profuse watery diarrhea which was treated with omeprazole for several days before Dr. Ewell Poe nurse practitioner finally  tested her for C. difficile which was positive. She was started on oral Flagyl but no probiotics,  and failed to improve after several days so she decided to check herself out and go home. She has not had followup with GI or ID. She states she saw Dr. Dareen Piano only once after the diagnosis of C. difficile colitis was made. She is very disappointed with the care she received  at [facility]. She felt that the staff was un professional and that her symptoms were not taken seriously for several days. She also noted that the staff when handing out medications would grab the water cups by the top  with their bare hands which she acknowledges could be responsible for the spread of infection. She is accompanied by her husband today. She is miserable. Although her diarrhea has improved somewhat she is still  having 2 loose watery yellow stools daily. She is tolerating a modified diet. She has no appetite but is forcing herself to drink boost and Gatorade daily. She is using when these processes to get the bitter tasting Flagyl pills to go down. She is wondering if there is an alternative to the Flagyl. She has not had been having any vomiting and does not feel orthostatic. Her diarrhea does not appear to be post prandial anymore. It is also no longer nocturnal. Has been taking flagyl for 8 days  tid  and has lost a lot of weight and feels very weak.     Past Medical History  Diagnosis Date  . Atrial fibrillation   . Migraine headache   . Hyperlipidemia   . Stroke     History reviewed. No pertinent past surgical history.     The following portions of the patient's history were reviewed and updated as appropriate: Allergies, current medications, and problem list.    Review of Systems:   12 Pt  review of systems was negative except those addressed in the HPI,     History   Social History  . Marital Status: Married    Spouse Name: N/A    Number of Children: N/A  . Years of Education: N/A   Occupational History  . Not on file.   Social History Main Topics  . Smoking status: Former Smoker    Quit date: 03/29/1986  . Smokeless tobacco: Never Used  .  Alcohol Use: No  . Drug Use: No  . Sexual Activity: Not on file   Other Topics Concern  . Not on file   Social History Narrative  . No narrative on file    Objective:  Filed Vitals:   03/04/13 1420  BP: 128/72  Pulse: 97  Temp: 97.8 F (36.6 C)  Resp: 14     General appearance: alert, cooperative and appears chronically ill Ears: normal TM's and external ear canals both ears Throat: lips, mucosa, and tongue normal; teeth and gums normal Neck: no adenopathy, no carotid bruit, supple, symmetrical, trachea midline and thyroid not enlarged, symmetric, no tenderness/mass/nodules Back: symmetric, no curvature. ROM  normal. No CVA tenderness. Lungs: clear to auscultation bilaterally Heart: regular rate and rhythm, S1, S2 normal, no murmur, click, rub or gallop Abdomen: soft, non-tender; bowel sounds normal; no masses,  no organomegaly Pulses: 2+ and symmetric Skin: Skin color, texture, turgor normal. No rashes or lesions Lymph nodes: Cervical, supraclavicular, and axillary nodes normal.  Assessment and Plan:  Enteritis due to Clostridium difficile Although she is miserable, she is not orthostatic by my check. Her stools have improved in frequency to twice daily. She has been taking Flagyl for 7 days. I am rechecking her stools today. If her stools are still positive she will need referral to GI and ID and I will change her to oral vancomycin. I have discussed contact precautions with her and her husband, and thus far he has not had any signs or symptoms of the infection. For now she will continue the vancomycin and add cholesterol for probiotics.  Long term (current) use of anticoagulants Her INR is to be rechecked today. Recent home health check was low and her dose was adjusted.  A total of 40 minutes was spent with patient more than half of which was spent in counseling, reviewing records from other prviders and coordination of care.   Updated Medication List Outpatient Encounter Prescriptions as of 03/04/2013  Medication Sig Dispense Refill  . ALPRAZolam (XANAX) 0.25 MG tablet Take 1 tablet (0.25 mg total) by mouth daily. As needed for anxiety or insomnia  30 tablet  3  . Cholecalciferol (VITAMIN D3) 3000 UNITS TABS Take 2,000 Units by mouth daily.      Marland Kitchen co-enzyme Q-10 30 MG capsule Take 50 mg by mouth daily.      . famotidine (PEPCID) 40 MG tablet Take 40 mg by mouth daily.      . metoprolol succinate (TOPROL-XL) 25 MG 24 hr tablet TAKE 1 TABLET EVERY DAY  90 tablet  3  . metroNIDAZOLE (FLAGYL) 500 MG tablet Take 500 mg by mouth 3 (three) times daily.      . simvastatin (ZOCOR) 10 MG tablet TAKE  1 TABLET AT BEDTIME  90 tablet  3  . warfarin (COUMADIN) 3 MG tablet Take 1 tablet (3 mg total) by mouth daily.  30 tablet  2  . warfarin (COUMADIN) 4 MG tablet Take 1 tablet (4 mg total) by mouth daily.  30 tablet  5  . dexlansoprazole (DEXILANT) 60 MG capsule Take 1 capsule (60 mg total) by mouth daily.  15 capsule  0  . Lactobacillus-Inulin (CULTURELLE DIGESTIVE HEALTH) CAPS Take 1 capsule by mouth daily.  30 capsule  0   No facility-administered encounter medications on file as of 03/04/2013.     Orders Placed This Encounter  Procedures  . Clostridium difficile EIA  . Magnesium  . CBC with Differential  . Comprehensive metabolic panel  .  Protime-INR    No Follow-up on file.

## 2013-03-05 DIAGNOSIS — Z8619 Personal history of other infectious and parasitic diseases: Secondary | ICD-10-CM | POA: Insufficient documentation

## 2013-03-05 DIAGNOSIS — S72141A Displaced intertrochanteric fracture of right femur, initial encounter for closed fracture: Secondary | ICD-10-CM | POA: Insufficient documentation

## 2013-03-05 LAB — CBC WITH DIFFERENTIAL/PLATELET
Basophils Absolute: 0.1 10*3/uL (ref 0.0–0.1)
Hemoglobin: 11.3 g/dL — ABNORMAL LOW (ref 12.0–15.0)
Lymphocytes Relative: 48.4 % — ABNORMAL HIGH (ref 12.0–46.0)
Monocytes Relative: 8.5 % (ref 3.0–12.0)
Neutro Abs: 2.2 10*3/uL (ref 1.4–7.7)
Neutrophils Relative %: 39.3 % — ABNORMAL LOW (ref 43.0–77.0)
Platelets: 172 10*3/uL (ref 150.0–400.0)
RDW: 16.5 % — ABNORMAL HIGH (ref 11.5–14.6)

## 2013-03-05 LAB — COMPREHENSIVE METABOLIC PANEL
CO2: 22 mEq/L (ref 19–32)
GFR: 72.97 mL/min (ref >60.00)
Glucose, Bld: 109 mg/dL — ABNORMAL HIGH (ref 70–99)
Sodium: 137 mEq/L (ref 135–145)
Total Bilirubin: 0.5 mg/dL (ref 0.3–1.2)
Total Protein: 7.1 g/dL (ref 6.0–8.3)

## 2013-03-05 LAB — PROTIME-INR
INR: 1.5 ratio — ABNORMAL HIGH (ref 0.8–1.0)
Prothrombin Time: 15.9 s — ABNORMAL HIGH (ref 10.2–12.4)

## 2013-03-05 NOTE — Assessment & Plan Note (Addendum)
Although she is miserable, she is not orthostatic by my check. Her stools have improved in frequency to twice daily. She has been taking Flagyl for 7 days. I am rechecking her stools today. If her stools are still positive she will need referral to GI and ID and I will change her to oral vancomycin. I have discussed contact precautions with her and her husband, and thus far he has not had any signs or symptoms of the infection

## 2013-03-05 NOTE — Assessment & Plan Note (Signed)
Her INR is to be rechecked today. Recent home health check was low and her dose was adjusted.

## 2013-03-06 ENCOUNTER — Encounter: Payer: Self-pay | Admitting: *Deleted

## 2013-03-06 LAB — CLOSTRIDIUM DIFFICILE EIA: CDIFTX: NEGATIVE

## 2013-03-09 ENCOUNTER — Telehealth: Payer: Self-pay | Admitting: *Deleted

## 2013-03-09 NOTE — Telephone Encounter (Signed)
[facility] patient PT 1.9 INR 23.5 on 2 mg coumadin  Daily. Please advise?

## 2013-03-09 NOTE — Telephone Encounter (Signed)
Increase to 3 mg on Tuesdays and Fridays and Sundays,  2 mg all other days .  Repeat IN R 2 week s

## 2013-03-10 DIAGNOSIS — M810 Age-related osteoporosis without current pathological fracture: Secondary | ICD-10-CM

## 2013-03-10 DIAGNOSIS — S72009D Fracture of unspecified part of neck of unspecified femur, subsequent encounter for closed fracture with routine healing: Secondary | ICD-10-CM

## 2013-03-10 DIAGNOSIS — I69959 Hemiplegia and hemiparesis following unspecified cerebrovascular disease affecting unspecified side: Secondary | ICD-10-CM

## 2013-03-10 DIAGNOSIS — I4891 Unspecified atrial fibrillation: Secondary | ICD-10-CM

## 2013-03-10 NOTE — Telephone Encounter (Signed)
[facility] notified  As instructed.

## 2013-03-20 ENCOUNTER — Telehealth: Payer: Self-pay | Admitting: *Deleted

## 2013-03-20 DIAGNOSIS — M6281 Muscle weakness (generalized): Secondary | ICD-10-CM

## 2013-03-20 NOTE — Telephone Encounter (Signed)
Faxed order to pharmacy as requested.

## 2013-03-20 NOTE — Telephone Encounter (Signed)
Order on printer

## 2013-03-20 NOTE — Telephone Encounter (Signed)
Patient called asking for script for a travel wheelchair patient understands insurance may not pay, but she feels her legs are weak from the effects of the C-DIFF, and is willing to purchase chair.

## 2013-03-23 ENCOUNTER — Telehealth: Payer: Self-pay | Admitting: *Deleted

## 2013-03-23 NOTE — Telephone Encounter (Signed)
Left message to call office

## 2013-03-23 NOTE — Telephone Encounter (Signed)
Your INR is very low at 1.1  Please confirm that you have been taking 3 mg and 2 mg as directed last month  If she has ,  Increase dose to 3 mg daily and repeat INR in one week

## 2013-03-23 NOTE — Telephone Encounter (Signed)
Alicia Moran with [facility] called with INR result of 1.1. Attempted to contact pt to clarify current coumadin regimen but no answer. Last regimen 03/09/13 was 3 mg Tuesday, Friday, and Sunday. And 2 mg all other days.

## 2013-03-24 NOTE — Telephone Encounter (Signed)
Pt returned call, left vm.  Please call regarding INR per previous phone note.

## 2013-03-24 NOTE — Telephone Encounter (Signed)
Left message for patient to return my call.

## 2013-03-24 NOTE — Telephone Encounter (Signed)
Patient notified

## 2013-03-24 NOTE — Telephone Encounter (Signed)
Tried to call [facility] left on hold for 10 minute's will return call.

## 2013-04-02 ENCOUNTER — Telehealth: Payer: Self-pay | Admitting: Internal Medicine

## 2013-04-02 ENCOUNTER — Encounter: Payer: Self-pay | Admitting: *Deleted

## 2013-04-02 ENCOUNTER — Telehealth: Payer: Self-pay | Admitting: *Deleted

## 2013-04-02 MED ORDER — MEGESTROL ACETATE 40 MG PO TABS: 40.0000 mg | ORAL_TABLET | Freq: Two times a day (BID) | ORAL | Status: DC

## 2013-04-02 MED ORDER — WARFARIN SODIUM 6 MG PO TABS: 6.0000 mg | ORAL_TABLET | Freq: Every day | ORAL | Status: DC

## 2013-04-02 NOTE — Telephone Encounter (Signed)
Thank you for your letter.  I am sorry to hear you are still battling infections. To anser the questions you raised:  1)  Unfortunately the hand sterilizer does not kill the bacteria you had in your bowels.  It results from the use of antibiotics to kill other infections, most of the time, and only resolves with specific antibiotic treatment.  2) Yes, you can safely get the flu shot.   3) All of the infections you listed are not due to the traffic in your house bringing in their own  infections.  Thrush and UTIs are not contagious.   4) Would she like to try something to stimulate her appetite?  Megace and remeron are both medications that will help

## 2013-04-02 NOTE — Telephone Encounter (Signed)
Take 6 mg tonight then alternate between 6 and 3 mg and recheck in 1 week  Dose is likely to change again because of the new appetite medictcation

## 2013-04-02 NOTE — Telephone Encounter (Signed)
Pt notified and verbalized understanding. Order for repeat INR faxed to [facility] [phone].

## 2013-04-02 NOTE — Telephone Encounter (Signed)
Pt.notified

## 2013-04-02 NOTE — Telephone Encounter (Signed)
Spoke with pt and notified of advice. States she would like to try a medication for appetite stimulation.

## 2013-04-02 NOTE — Telephone Encounter (Signed)
Alicia Moran from [facility] called with INR 1.4. Spoke with pt, she is currently taking Coumadin 3 mg daily.

## 2013-04-02 NOTE — Telephone Encounter (Signed)
Megace rx sent to pharmacy

## 2013-04-02 NOTE — Telephone Encounter (Signed)
Pt left VM, requesting Coumadin 6 mg to be sent to [facility].

## 2013-04-14 ENCOUNTER — Other Ambulatory Visit: Payer: Self-pay | Admitting: Internal Medicine

## 2013-04-17 ENCOUNTER — Telehealth: Payer: Self-pay | Admitting: *Deleted

## 2013-04-17 NOTE — Telephone Encounter (Signed)
Coumadin level is therapeutic,  Continue current regimen and repeat PT/INR in one month 

## 2013-04-17 NOTE — Telephone Encounter (Signed)
[facility] from [facility] called pts INR is 2.0

## 2013-04-17 NOTE — Telephone Encounter (Signed)
Patient notified of results.

## 2013-05-18 ENCOUNTER — Other Ambulatory Visit (INDEPENDENT_AMBULATORY_CARE_PROVIDER_SITE_OTHER): Payer: Medicare Other

## 2013-05-18 DIAGNOSIS — Z7901 Long term (current) use of anticoagulants: Secondary | ICD-10-CM

## 2013-05-18 LAB — PROTIME-INR
INR: 1.6 ratio — ABNORMAL HIGH (ref 0.8–1.0)
Prothrombin Time: 17.1 s — ABNORMAL HIGH (ref 10.2–12.4)

## 2013-05-27 ENCOUNTER — Other Ambulatory Visit: Payer: Self-pay | Admitting: Internal Medicine

## 2013-06-02 ENCOUNTER — Other Ambulatory Visit (INDEPENDENT_AMBULATORY_CARE_PROVIDER_SITE_OTHER): Payer: Medicare Other

## 2013-06-02 DIAGNOSIS — Z7901 Long term (current) use of anticoagulants: Secondary | ICD-10-CM

## 2013-06-02 LAB — PROTIME-INR
INR: 1.7 ratio — ABNORMAL HIGH (ref 0.8–1.0)
Prothrombin Time: 17.9 s — ABNORMAL HIGH (ref 10.2–12.4)

## 2013-06-16 ENCOUNTER — Other Ambulatory Visit (INDEPENDENT_AMBULATORY_CARE_PROVIDER_SITE_OTHER): Payer: Medicare Other

## 2013-06-16 DIAGNOSIS — Z7901 Long term (current) use of anticoagulants: Secondary | ICD-10-CM

## 2013-06-16 LAB — PROTIME-INR
INR: 4.7 ratio — ABNORMAL HIGH (ref 0.8–1.0)
Prothrombin Time: 48.1 s — ABNORMAL HIGH (ref 10.2–12.4)

## 2013-06-24 ENCOUNTER — Other Ambulatory Visit (INDEPENDENT_AMBULATORY_CARE_PROVIDER_SITE_OTHER): Payer: Medicare Other

## 2013-06-24 ENCOUNTER — Other Ambulatory Visit: Payer: Medicare Other

## 2013-06-24 DIAGNOSIS — Z7901 Long term (current) use of anticoagulants: Secondary | ICD-10-CM

## 2013-06-24 LAB — PROTIME-INR
INR: 1.6 ratio — ABNORMAL HIGH (ref 0.8–1.0)
Prothrombin Time: 16.4 s — ABNORMAL HIGH (ref 10.2–12.4)

## 2013-06-24 MED ORDER — WARFARIN SODIUM 4 MG PO TABS: 4.0000 mg | ORAL_TABLET | Freq: Every day | ORAL | Status: DC

## 2013-06-24 NOTE — Addendum Note (Signed)
Addended by: Crecencio Mc on: 06/24/2013 03:55 PM   Modules accepted: Orders

## 2013-07-09 ENCOUNTER — Other Ambulatory Visit (INDEPENDENT_AMBULATORY_CARE_PROVIDER_SITE_OTHER): Payer: Medicare Other

## 2013-07-09 DIAGNOSIS — Z7901 Long term (current) use of anticoagulants: Secondary | ICD-10-CM

## 2013-07-09 LAB — PROTIME-INR
INR: 1.9 ratio — ABNORMAL HIGH (ref 0.8–1.0)
Prothrombin Time: 19.9 s — ABNORMAL HIGH (ref 10.2–12.4)

## 2013-08-10 ENCOUNTER — Other Ambulatory Visit (INDEPENDENT_AMBULATORY_CARE_PROVIDER_SITE_OTHER): Payer: Medicare Other

## 2013-08-10 DIAGNOSIS — Z7901 Long term (current) use of anticoagulants: Secondary | ICD-10-CM

## 2013-08-10 LAB — PROTIME-INR
INR: 2.1 ratio — ABNORMAL HIGH (ref 0.8–1.0)
Prothrombin Time: 21.5 s — ABNORMAL HIGH (ref 10.2–12.4)

## 2013-08-17 ENCOUNTER — Telehealth: Payer: Self-pay | Admitting: Internal Medicine

## 2013-08-17 NOTE — Telephone Encounter (Signed)
Mailed friday 08/14/13

## 2013-08-17 NOTE — Telephone Encounter (Signed)
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 She is calling to see if it is ready. Please mail to the patient when it is ready.

## 2013-09-09 ENCOUNTER — Other Ambulatory Visit (INDEPENDENT_AMBULATORY_CARE_PROVIDER_SITE_OTHER): Payer: Medicare Other

## 2013-09-09 DIAGNOSIS — Z7901 Long term (current) use of anticoagulants: Secondary | ICD-10-CM

## 2013-09-09 LAB — PROTIME-INR
INR: 2.4 ratio — AB (ref 0.8–1.0)
Prothrombin Time: 24.6 s — ABNORMAL HIGH (ref 10.2–12.4)

## 2013-09-09 NOTE — Addendum Note (Signed)
Addended by: Karlene Einstein D on: 09/09/2013 01:54 PM   Modules accepted: Orders

## 2013-10-12 ENCOUNTER — Other Ambulatory Visit (INDEPENDENT_AMBULATORY_CARE_PROVIDER_SITE_OTHER): Payer: Medicare Other

## 2013-10-12 DIAGNOSIS — Z7901 Long term (current) use of anticoagulants: Secondary | ICD-10-CM

## 2013-10-12 LAB — PROTIME-INR
INR: 2 ratio — ABNORMAL HIGH (ref 0.8–1.0)
Prothrombin Time: 22.3 s — ABNORMAL HIGH (ref 9.6–13.1)

## 2013-10-28 ENCOUNTER — Ambulatory Visit (INDEPENDENT_AMBULATORY_CARE_PROVIDER_SITE_OTHER): Payer: Medicare Other | Admitting: Internal Medicine

## 2013-10-28 ENCOUNTER — Encounter: Payer: Self-pay | Admitting: Internal Medicine

## 2013-10-28 VITALS — BP 138/74 | HR 90 | Temp 98.0°F | Resp 14 | Wt 143.0 lb

## 2013-10-28 DIAGNOSIS — Z7901 Long term (current) use of anticoagulants: Secondary | ICD-10-CM

## 2013-10-28 DIAGNOSIS — R197 Diarrhea, unspecified: Secondary | ICD-10-CM

## 2013-10-28 DIAGNOSIS — F4323 Adjustment disorder with mixed anxiety and depressed mood: Secondary | ICD-10-CM

## 2013-10-28 DIAGNOSIS — G609 Hereditary and idiopathic neuropathy, unspecified: Secondary | ICD-10-CM

## 2013-10-28 LAB — PROTIME-INR
INR: 1.4 ratio — ABNORMAL HIGH (ref 0.8–1.0)
Prothrombin Time: 15.6 s — ABNORMAL HIGH (ref 9.6–13.1)

## 2013-10-28 MED ORDER — GABAPENTIN 100 MG PO CAPS: 100.0000 mg | ORAL_CAPSULE | Freq: Every day | ORAL | Status: DC

## 2013-10-28 MED ORDER — ESCITALOPRAM OXALATE 10 MG PO TABS: 10.0000 mg | ORAL_TABLET | Freq: Every day | ORAL | Status: DC

## 2013-10-28 NOTE — Patient Instructions (Addendum)
  Try  Drinking [place] . Almond/coconut milk   8 ounces daily for calcium, vitamin  D and E   Trial of lexapro start with 1/2 tablet after dinner.  Increase to full tablet after one week  (anti depressant)  You can continue the xanax as needed,  It [place] ok to  combine with lexapro  I will pray for Mickel Baas   Take home stool test

## 2013-10-28 NOTE — Progress Notes (Signed)
Pre-visit discussion using our clinic review tool. No additional management support is needed unless otherwise documented below in the visit note.  

## 2013-10-29 LAB — COMPREHENSIVE METABOLIC PANEL
ALT: 13 U/L (ref 0–35)
AST: 18 U/L (ref 0–37)
Albumin: 4.2 g/dL (ref 3.5–5.2)
Alkaline Phosphatase: 75 U/L (ref 39–117)
BILIRUBIN TOTAL: 0.7 mg/dL (ref 0.2–1.2)
BUN: 25 mg/dL — ABNORMAL HIGH (ref 6–23)
CO2: 27 mEq/L (ref 19–32)
Calcium: 9.6 mg/dL (ref 8.4–10.5)
Chloride: 101 mEq/L (ref 96–112)
Creatinine, Ser: 0.8 mg/dL (ref 0.4–1.2)
GFR: 71.82 mL/min (ref >60.00)
Glucose, Bld: 102 mg/dL — ABNORMAL HIGH (ref 70–99)
Potassium: 3.8 mEq/L (ref 3.5–5.1)
Sodium: 135 mEq/L (ref 135–145)
Total Protein: 6.8 g/dL (ref 6.0–8.3)

## 2013-10-29 LAB — MAGNESIUM: MAGNESIUM: 1.8 mg/dL (ref 1.5–2.5)

## 2013-10-29 LAB — FOLATE RBC: RBC FOLATE: 710 ng/mL (ref >280)

## 2013-10-29 LAB — TSH: TSH: 3.22 u[IU]/mL (ref 0.35–4.50)

## 2013-10-29 LAB — VITAMIN B12: Vitamin B-12: 487 pg/mL (ref 211–911)

## 2013-10-29 LAB — RPR

## 2013-10-30 MED ORDER — WARFARIN SODIUM 1 MG PO TABS: 1.0000 mg | ORAL_TABLET | Freq: Every day | ORAL | Status: DC

## 2013-10-31 DIAGNOSIS — F4323 Adjustment disorder with mixed anxiety and depressed mood: Secondary | ICD-10-CM | POA: Insufficient documentation

## 2013-10-31 NOTE — Assessment & Plan Note (Signed)
Aggravated by daughter's serious illness and inability to receive updated information due to HIPAA.  Discussed trial of generic lexapro starting with 5 mg daily .  Also disucssed trial f gabantin for neuropathy but will defer until follow up in one month

## 2013-10-31 NOTE — Progress Notes (Signed)
Patient ID: Alicia Moran, female   DOB: [birth year], 80 y.o.   MRN: [id number]  Patient Active Problem List   Diagnosis Date Noted  . Adjustment disorder with mixed anxiety and depressed mood 10/31/2013  . Enteritis due to Clostridium difficile 03/05/2013  . Intertrochanteric fracture of right hip 03/05/2013  . Urinary frequency 09/07/2012  . Screening for colon cancer 12/24/2011  . Long term (current) use of anticoagulants 04/01/2011  . Atrial fibrillation   . Stroke   . Migraine headache   . Hyperlipidemia     Subjective:  CC:   Chief Complaint  Patient presents with  . Follow-up    coumadin before surgery on her left eye surgery.    HPI:   Alicia Moran is a 80 y.o. female who presents for Follow up on multiple issues.  She is very anxious today due to uncertainty about the prognosis of her daughter who is seriously ill  Hospitalized in the ICU at a hospital in [facility].  She has continued to endorse malaise, fatigue and altered bowel habits since her episode of C dif colitis several months ago.  Having trouble sleeping   Due to anxiety and restless legs. Using alprazolam as needed for insomnia due to anxiety. But feels she needs additional medication to manage daytime symptoms    Past Medical History  Diagnosis Date  . Atrial fibrillation   . Migraine headache   . Hyperlipidemia   . Stroke     No past surgical history on file.     The following portions of the patient's history were reviewed and updated as appropriate: Allergies, current medications, and problem list.    Review of Systems:   Patient denies headache, fevers, malaise, unintentional weight loss, skin rash, eye pain, sinus congestion and sinus pain, sore throat, dysphagia,  hemoptysis , cough, dyspnea, wheezing, chest pain, palpitations, orthopnea, edema, abdominal pain, nausea, melena, diarrhea, constipation, flank pain, dysuria, hematuria, urinary  Frequency, nocturia, numbness, tingling, seizures,   Focal weakness, Loss of consciousness,  Tremor, insomnia, depression, anxiety, and suicidal ideation.     History   Social History  . Marital Status: Married    Spouse Name: N/A    Number of Children: N/A  . Years of Education: N/A   Occupational History  . Not on file.   Social History Main Topics  . Smoking status: Former Smoker    Quit date: 03/29/1986  . Smokeless tobacco: Never Used  . Alcohol Use: No  . Drug Use: No  . Sexual Activity: Not on file   Other Topics Concern  . Not on file   Social History Narrative  . No narrative on file    Objective:  Filed Vitals:   10/28/13 1406  BP: 138/74  Pulse: 90  Temp: 98 F (36.7 C)  Resp: 14     General appearance: alert, cooperative and appears stated age Ears: normal TM's and external ear canals both ears Throat: lips, mucosa, and tongue normal; teeth and gums normal Neck: no adenopathy, no carotid bruit, supple, symmetrical, trachea midline and thyroid not enlarged, symmetric, no tenderness/mass/nodules Back: symmetric, no curvature. ROM normal. No CVA tenderness. Lungs: clear to auscultation bilaterally Heart: regular rate and rhythm, S1, S2 normal, no murmur, click, rub or gallop Abdomen: soft, non-tender; bowel sounds normal; no masses,  no organomegaly Pulses: 2+ and symmetric Skin: Skin color, texture, turgor normal. No rashes or lesions Lymph nodes: Cervical, supraclavicular, and axillary nodes normal.  Assessment and Plan:  Adjustment disorder with mixed  Patient ID: Alicia Moran, female   DOB: 03/01/1929, 80 y.o.   MRN: [id number]  Patient Active Problem List   Diagnosis Date Noted  . Adjustment disorder with mixed anxiety and depressed mood 10/31/2013  . Enteritis due to Clostridium difficile 03/05/2013  . Intertrochanteric fracture of right hip 03/05/2013  . Urinary frequency 09/07/2012  . Screening for colon cancer 12/24/2011  . Long term (current) use of anticoagulants 04/01/2011  . Atrial fibrillation   . Stroke   . Migraine headache   . Hyperlipidemia     Subjective:  CC:   Chief Complaint  Patient presents with  . Follow-up    coumadin before surgery on her left eye surgery.    HPI:   Alicia Moran is a 80 y.o. female who presents for Follow up on multiple issues.  She is very anxious today due to uncertainty about the prognosis of her daughter who is seriously ill  Hospitalized in the ICU at a hospital in [place].  She has continued to endorse malaise, fatigue and altered bowel habits since her episode of C dif colitis several months ago.  Having trouble sleeping   Due to anxiety and restless legs. Using alprazolam as needed for insomnia due to anxiety. But feels she needs additional medication to manage daytime symptoms    Past Medical History  Diagnosis Date  . Atrial fibrillation   . Migraine headache   . Hyperlipidemia   . Stroke     No past surgical history on file.     The following portions of the patient's history were reviewed and updated as appropriate: Allergies, current medications, and problem list.    Review of Systems:   Patient denies headache, fevers, malaise, unintentional weight loss, skin rash, eye pain, sinus congestion and sinus pain, sore throat, dysphagia,  hemoptysis , cough, dyspnea, wheezing, chest pain, palpitations, orthopnea, edema, abdominal pain, nausea, melena, diarrhea, constipation, flank pain, dysuria, hematuria, urinary  Frequency, nocturia, numbness, tingling, seizures,   Focal weakness, Loss of consciousness,  Tremor, insomnia, depression, anxiety, and suicidal ideation.     History   Social History  . Marital Status: Married    Spouse Name: N/A    Number of Children: N/A  . Years of Education: N/A   Occupational History  . Not on file.   Social History Main Topics  . Smoking status: Former Smoker    Quit date: 03/29/1986  . Smokeless tobacco: Never Used  . Alcohol Use: No  . Drug Use: No  . Sexual Activity: Not on file   Other Topics Concern  . Not on file   Social History Narrative  . No narrative on file    Objective:  Filed Vitals:   10/28/13 1406  BP: 138/74  Pulse: 90  Temp: 98 F (36.7 C)  Resp: 14     General appearance: alert, cooperative and appears stated age Ears: normal TM's and external ear canals both ears Throat: lips, mucosa, and tongue normal; teeth and gums normal Neck: no adenopathy, no carotid bruit, supple, symmetrical, trachea midline and thyroid not enlarged, symmetric, no tenderness/mass/nodules Back: symmetric, no curvature. ROM normal. No CVA tenderness. Lungs: clear to auscultation bilaterally Heart: regular rate and rhythm, S1, S2 normal, no murmur, click, rub or gallop Abdomen: soft, non-tender; bowel sounds normal; no masses,  no organomegaly Pulses: 2+ and symmetric Skin: Skin color, texture, turgor normal. No rashes or lesions Lymph nodes: Cervical, supraclavicular, and axillary nodes normal.  Assessment and Plan:  Adjustment disorder with mixed

## 2013-10-31 NOTE — Assessment & Plan Note (Signed)
Coumadin level is low on current  regimen of 4 mg daily.  Advised to take 6 mg tonight and 5 mg daily going forward.  Repeat in one week.  Lab Results  Component Value Date   INR 1.4* 10/28/2013   INR 2.0* 10/12/2013   INR 2.4* 09/09/2013

## 2013-11-04 ENCOUNTER — Other Ambulatory Visit (INDEPENDENT_AMBULATORY_CARE_PROVIDER_SITE_OTHER): Payer: Medicare Other

## 2013-11-04 DIAGNOSIS — Z7901 Long term (current) use of anticoagulants: Secondary | ICD-10-CM

## 2013-11-04 LAB — PROTIME-INR
INR: 2.4 ratio — ABNORMAL HIGH (ref 0.8–1.0)
Prothrombin Time: 25.5 s — ABNORMAL HIGH (ref 9.6–13.1)

## 2013-12-01 ENCOUNTER — Ambulatory Visit: Payer: Medicare Other | Admitting: Internal Medicine

## 2013-12-04 ENCOUNTER — Other Ambulatory Visit: Payer: Self-pay | Admitting: *Deleted

## 2013-12-04 ENCOUNTER — Other Ambulatory Visit (INDEPENDENT_AMBULATORY_CARE_PROVIDER_SITE_OTHER): Payer: Medicare Other

## 2013-12-04 DIAGNOSIS — Z1211 Encounter for screening for malignant neoplasm of colon: Secondary | ICD-10-CM

## 2013-12-04 DIAGNOSIS — Z7901 Long term (current) use of anticoagulants: Secondary | ICD-10-CM

## 2013-12-04 LAB — PROTIME-INR
INR: 2 ratio — ABNORMAL HIGH (ref 0.8–1.0)
Prothrombin Time: 21.9 s — ABNORMAL HIGH (ref 9.6–13.1)

## 2013-12-04 LAB — FECAL OCCULT BLOOD, IMMUNOCHEMICAL: FECAL OCCULT BLD: POSITIVE — AB

## 2013-12-08 ENCOUNTER — Telehealth: Payer: Self-pay | Admitting: Internal Medicine

## 2013-12-08 DIAGNOSIS — Z7901 Long term (current) use of anticoagulants: Secondary | ICD-10-CM

## 2013-12-08 DIAGNOSIS — R195 Other fecal abnormalities: Secondary | ICD-10-CM

## 2013-12-08 NOTE — Telephone Encounter (Signed)
Patient notified and voiced understanding.

## 2013-12-08 NOTE — Telephone Encounter (Signed)
Yes it can.  So given her age  I will not refer for colonoscopy unless she has evidence of iron deficiency anemia..  When she returns in July for repeat INR, will add the labs for anemia and iron

## 2013-12-08 NOTE — Telephone Encounter (Signed)
Patient following up on fecal occult blood test results sent message back to you patient has not had Hemorrhoids but has had constipation please advise if the constipation could be causing a positive test.

## 2013-12-25 ENCOUNTER — Ambulatory Visit: Payer: Medicare Other | Admitting: Internal Medicine

## 2013-12-28 ENCOUNTER — Ambulatory Visit: Payer: Medicare Other | Admitting: Internal Medicine

## 2013-12-29 ENCOUNTER — Other Ambulatory Visit: Payer: Self-pay | Admitting: Internal Medicine

## 2014-01-06 ENCOUNTER — Other Ambulatory Visit (INDEPENDENT_AMBULATORY_CARE_PROVIDER_SITE_OTHER): Payer: Medicare Other

## 2014-01-06 DIAGNOSIS — R195 Other fecal abnormalities: Secondary | ICD-10-CM

## 2014-01-06 DIAGNOSIS — Z7901 Long term (current) use of anticoagulants: Secondary | ICD-10-CM

## 2014-01-06 LAB — CBC WITH DIFFERENTIAL/PLATELET
BASOS ABS: 0 10*3/uL (ref 0.0–0.1)
BASOS PCT: 0.5 % (ref 0.0–3.0)
EOS ABS: 0.1 10*3/uL (ref 0.0–0.7)
Eosinophils Relative: 1.4 % (ref 0.0–5.0)
HCT: 34.8 % — ABNORMAL LOW (ref 36.0–46.0)
Hemoglobin: 11.7 g/dL — ABNORMAL LOW (ref 12.0–15.0)
LYMPHS PCT: 54.4 % — AB (ref 12.0–46.0)
Lymphs Abs: 3.6 10*3/uL (ref 0.7–4.0)
MCHC: 33.7 g/dL (ref 30.0–36.0)
MCV: 96.4 fl (ref 78.0–100.0)
MONO ABS: 0.4 10*3/uL (ref 0.1–1.0)
Monocytes Relative: 6.6 % (ref 3.0–12.0)
NEUTROS ABS: 2.4 10*3/uL (ref 1.4–7.7)
NEUTROS PCT: 37.1 % — AB (ref 43.0–77.0)
Platelets: 152 10*3/uL (ref 150.0–400.0)
RBC: 3.61 Mil/uL — AB (ref 3.87–5.11)
RDW: 13.4 % (ref 11.5–15.5)
WBC: 6.6 10*3/uL (ref 4.0–10.5)

## 2014-01-06 LAB — PROTIME-INR
INR: 2.3 ratio — ABNORMAL HIGH (ref 0.8–1.0)
Prothrombin Time: 24.7 s — ABNORMAL HIGH (ref 9.6–13.1)

## 2014-01-06 LAB — FERRITIN: Ferritin: 105.8 ng/mL (ref 10.0–291.0)

## 2014-01-07 LAB — IRON AND TIBC
%SAT: 17 % — ABNORMAL LOW (ref 20–55)
Iron: 49 ug/dL (ref 42–145)
TIBC: 282 ug/dL (ref 250–470)
UIBC: 233 ug/dL (ref 125–400)

## 2014-01-20 ENCOUNTER — Ambulatory Visit: Payer: Medicare Other | Admitting: Internal Medicine

## 2014-01-22 ENCOUNTER — Other Ambulatory Visit: Payer: Self-pay | Admitting: Internal Medicine

## 2014-01-22 NOTE — Telephone Encounter (Signed)
Last refill 4.7.15, last OV 5.13.15.  Please advise refill.

## 2014-01-23 ENCOUNTER — Other Ambulatory Visit: Payer: Self-pay | Admitting: Internal Medicine

## 2014-01-25 ENCOUNTER — Other Ambulatory Visit: Payer: Self-pay | Admitting: Internal Medicine

## 2014-01-25 NOTE — Telephone Encounter (Signed)
Ok to refill,  printed rx  

## 2014-01-26 NOTE — Telephone Encounter (Signed)
Rx faxed

## 2014-02-04 ENCOUNTER — Other Ambulatory Visit (INDEPENDENT_AMBULATORY_CARE_PROVIDER_SITE_OTHER): Payer: Medicare Other

## 2014-02-04 DIAGNOSIS — Z7901 Long term (current) use of anticoagulants: Secondary | ICD-10-CM

## 2014-02-04 LAB — PROTIME-INR
INR: 4.2 ratio — ABNORMAL HIGH (ref 0.8–1.0)
PROTHROMBIN TIME: 44.7 s — AB (ref 9.6–13.1)

## 2014-02-05 ENCOUNTER — Telehealth: Payer: Self-pay | Admitting: *Deleted

## 2014-02-05 NOTE — Telephone Encounter (Signed)
Pt left VM regarding her PT/INR results. States we may leave a message. INR 4.2, results given to Dr. Gilford Rile. Left message for patient to hold coumadin Fri, Sat, and Sun and to repeat lab on Monday. Requested call back to confirm she received this message and to schedule lab appt.

## 2014-02-08 ENCOUNTER — Other Ambulatory Visit (INDEPENDENT_AMBULATORY_CARE_PROVIDER_SITE_OTHER): Payer: Medicare Other

## 2014-02-08 DIAGNOSIS — Z7901 Long term (current) use of anticoagulants: Secondary | ICD-10-CM

## 2014-02-08 LAB — PROTIME-INR
INR: 1.1 ratio — ABNORMAL HIGH (ref 0.8–1.0)
PROTHROMBIN TIME: 12.6 s (ref 9.6–13.1)

## 2014-02-08 NOTE — Telephone Encounter (Signed)
Pt in for blood work this morning.

## 2014-02-15 ENCOUNTER — Other Ambulatory Visit (INDEPENDENT_AMBULATORY_CARE_PROVIDER_SITE_OTHER): Payer: Medicare Other

## 2014-02-15 DIAGNOSIS — Z7901 Long term (current) use of anticoagulants: Secondary | ICD-10-CM

## 2014-02-15 LAB — PROTIME-INR
INR: 1.6 ratio — ABNORMAL HIGH (ref 0.8–1.0)
PROTHROMBIN TIME: 17.7 s — AB (ref 9.6–13.1)

## 2014-02-16 NOTE — Assessment & Plan Note (Signed)
INr low on 5 mg daily.  Will Increase to 6 mg on Mon Wed Frday and Sunday, continue 5 mg all other day s. repeat INR in 2 weeks  Lab Results  Component Value Date   INR 1.6* 02/15/2014   INR 1.1* 02/08/2014   INR 4.2* 02/04/2014

## 2014-02-17 ENCOUNTER — Other Ambulatory Visit: Payer: Self-pay | Admitting: *Deleted

## 2014-02-17 MED ORDER — WARFARIN SODIUM 1 MG PO TABS: ORAL_TABLET | ORAL | Status: DC

## 2014-02-17 MED ORDER — WARFARIN SODIUM 5 MG PO TABS: ORAL_TABLET | ORAL | Status: DC

## 2014-03-01 ENCOUNTER — Other Ambulatory Visit (INDEPENDENT_AMBULATORY_CARE_PROVIDER_SITE_OTHER): Payer: Medicare Other

## 2014-03-01 DIAGNOSIS — Z7901 Long term (current) use of anticoagulants: Secondary | ICD-10-CM

## 2014-03-01 LAB — PROTIME-INR
INR: 1.9 ratio — AB (ref 0.8–1.0)
Prothrombin Time: 21 s — ABNORMAL HIGH (ref 9.6–13.1)

## 2014-03-17 ENCOUNTER — Other Ambulatory Visit (INDEPENDENT_AMBULATORY_CARE_PROVIDER_SITE_OTHER): Payer: Medicare Other

## 2014-03-17 DIAGNOSIS — Z23 Encounter for immunization: Secondary | ICD-10-CM

## 2014-03-17 DIAGNOSIS — Z7901 Long term (current) use of anticoagulants: Secondary | ICD-10-CM

## 2014-03-17 LAB — PROTIME-INR
INR: 2.7 ratio — AB (ref 0.8–1.0)
PROTHROMBIN TIME: 29.6 s — AB (ref 9.6–13.1)

## 2014-03-19 ENCOUNTER — Telehealth: Payer: Self-pay | Admitting: Internal Medicine

## 2014-03-19 NOTE — Telephone Encounter (Signed)
Please review Call-a-nurse message and advise. Thank you!

## 2014-03-19 NOTE — Telephone Encounter (Signed)
Patient Information:  Caller Name: Iya  Phone: [phone]  Patient: Quentin Ore  Gender: Female  DOB: November 27, 1928  Age: 80 Years  PCP: Deborra Medina (Adults only)  Office Follow Up:  Does the office need to follow up with this patient?: Yes  Instructions For The Office: Encouraged to take Meclazine, Increase fluids, rest .  Coumdain is level is therapeutic.  Please review and advise.  RN Note:  Encouraged to take Meclazine, Increase fluids, rest .  Coumdain is level is therapeutic.  Please review and advise.  Symptoms  Reason For Call & Symptoms: Patient takes Coumadin 6mg  daily.  She states on her left hand above wrist.  There is a vein that has bled under the skin . Onset yesterday 16:00 pm.  Size pencil eraser. No active bleeding.  No injury.  She is concerned because she is on Coumadin. No other areas of concern that are noted. Last PT /INR 2.7 and 29.6 done on 09/30 (2)  She states dizziness since lunch . Constant with sitting or standing. Diet today- Boost, applesauce ,  metamucil, chick fil A.  Fluid water 1 bottle water.  She has meclazine but has not tried medication. Blood pressure currently 150/72 P 80  Reviewed Health History In EMR: Yes  Reviewed Medications In EMR: Yes  Reviewed Allergies In EMR: Yes  Reviewed Surgeries / Procedures: Yes  Date of Onset of Symptoms: 03/18/2014  Guideline(s) Used:  Dizziness  Disposition Per Guideline:   Discuss with PCP and Callback by Nurse Today  Reason For Disposition Reached:   Taking a medicine that could cause dizziness (e.g., blood pressure medications, diuretics)  Advice Given:  Some Causes of Temporary Dizziness:  Poor Fluid Intake - Not drinking enough fluids and being a little dehydrated is a common cause of temporary dizziness. This is always worse during hot weather.  Standing Up Suddenly - Standing up suddenly (especially getting out of bed) or prolonged standing in one place are common causes of temporary  dizziness. Not drinking enough fluids always makes it worse. Certain medications can cause or increase this type of dizziness (e.g., blood pressure medications).  Drink Fluids:  Drink several glasses of fruit juice, other clear fluids, or water. This will improve hydration and blood glucose. If you have a fever or have had heat exposure, make sure the fluids are cold.  Rest for 1-2 Hours:  Lie down with feet elevated for 1 hour. This will improve blood flow and increase blood flow to the brain.  Call Back If:  Still feel dizzy after 2 hours of rest and fluids  Passes out (faints)  You become worse.  Patient Will Follow Care Advice:  YES

## 2014-03-19 NOTE — Telephone Encounter (Signed)
The bruise is nothing to worry about,  Her inr is therapeutic The dizziness may be due to sinuses or early viral infection,  Her blood pressure is not high enough to cause it  Try uisng the meclizine .  If the sinuses feel congested,  tye Afrin nasal spray or sudafed PE

## 2014-03-22 NOTE — Telephone Encounter (Signed)
Pt notified, and verbalized understanding. States dizziness has improved, thinks it may be related to sinuses. Will follow directions and will call back with failure of improvement.

## 2014-03-23 ENCOUNTER — Other Ambulatory Visit: Payer: Medicare Other

## 2014-03-24 ENCOUNTER — Other Ambulatory Visit: Payer: Medicare Other

## 2014-04-16 ENCOUNTER — Other Ambulatory Visit (INDEPENDENT_AMBULATORY_CARE_PROVIDER_SITE_OTHER): Payer: Medicare Other

## 2014-04-16 DIAGNOSIS — Z7901 Long term (current) use of anticoagulants: Secondary | ICD-10-CM

## 2014-04-16 LAB — PROTIME-INR
INR: 4 ratio — AB (ref 0.8–1.0)
Prothrombin Time: 42.9 s — ABNORMAL HIGH (ref 9.6–13.1)

## 2014-04-19 ENCOUNTER — Telehealth: Payer: Self-pay | Admitting: Internal Medicine

## 2014-04-19 DIAGNOSIS — Z7901 Long term (current) use of anticoagulants: Secondary | ICD-10-CM

## 2014-04-20 NOTE — Telephone Encounter (Signed)
Yes i will order the inr

## 2014-04-20 NOTE — Addendum Note (Signed)
Addended by: Crecencio Mc on: 04/20/2014 02:54 PM   Modules accepted: Orders

## 2014-04-20 NOTE — Telephone Encounter (Signed)
Called patient and advised hold coumadin tonight and start back on 04/21/14 with 5 mg daily and repeat in one week that ok ?

## 2014-04-22 ENCOUNTER — Telehealth: Payer: Self-pay | Admitting: Internal Medicine

## 2014-04-22 NOTE — Telephone Encounter (Signed)
Waiting for reports ordered from [facility] imaging

## 2014-04-22 NOTE — Telephone Encounter (Signed)
Patient requested my opinion via a letter about whether she should see a vascular surgeon which was advised by Dr Pryor Ochoa based on MRA/MRI results from 2013 that I do not have,  They were done at [place] and order by a neurologist , Dr. Leonie Man .  I cannot render an opinion at this time since I do not have the data .  Please request tham and schedule patient an appt with me in a few weeks to disucssed   ,  15 minutes ok.

## 2014-04-23 NOTE — Telephone Encounter (Signed)
Place 2 reports in red folder.

## 2014-04-28 ENCOUNTER — Other Ambulatory Visit (INDEPENDENT_AMBULATORY_CARE_PROVIDER_SITE_OTHER): Payer: Medicare Other

## 2014-04-28 DIAGNOSIS — Z7901 Long term (current) use of anticoagulants: Secondary | ICD-10-CM

## 2014-04-28 LAB — PROTIME-INR
INR: 2.1 ratio — AB (ref 0.8–1.0)
Prothrombin Time: 23.3 s — ABNORMAL HIGH (ref 9.6–13.1)

## 2014-05-27 ENCOUNTER — Other Ambulatory Visit: Payer: Self-pay | Admitting: *Deleted

## 2014-05-27 ENCOUNTER — Telehealth: Payer: Self-pay | Admitting: *Deleted

## 2014-05-27 DIAGNOSIS — Z7901 Long term (current) use of anticoagulants: Secondary | ICD-10-CM

## 2014-05-27 DIAGNOSIS — E785 Hyperlipidemia, unspecified: Secondary | ICD-10-CM

## 2014-05-27 DIAGNOSIS — R350 Frequency of micturition: Secondary | ICD-10-CM

## 2014-05-27 NOTE — Telephone Encounter (Signed)
Pt coming tomorrow what labs and dx? 

## 2014-05-28 ENCOUNTER — Other Ambulatory Visit (INDEPENDENT_AMBULATORY_CARE_PROVIDER_SITE_OTHER): Payer: Medicare Other

## 2014-05-28 DIAGNOSIS — E785 Hyperlipidemia, unspecified: Secondary | ICD-10-CM

## 2014-05-28 DIAGNOSIS — R350 Frequency of micturition: Secondary | ICD-10-CM

## 2014-05-28 DIAGNOSIS — Z7901 Long term (current) use of anticoagulants: Secondary | ICD-10-CM

## 2014-05-28 LAB — CBC WITH DIFFERENTIAL/PLATELET
BASOS PCT: 0.7 % (ref 0.0–3.0)
Basophils Absolute: 0 10*3/uL (ref 0.0–0.1)
EOS PCT: 1 % (ref 0.0–5.0)
Eosinophils Absolute: 0.1 10*3/uL (ref 0.0–0.7)
HCT: 34.3 % — ABNORMAL LOW (ref 36.0–46.0)
Hemoglobin: 11.2 g/dL — ABNORMAL LOW (ref 12.0–15.0)
Lymphocytes Relative: 48.8 % — ABNORMAL HIGH (ref 12.0–46.0)
Lymphs Abs: 3.2 10*3/uL (ref 0.7–4.0)
MCHC: 32.7 g/dL (ref 30.0–36.0)
MCV: 97.4 fl (ref 78.0–100.0)
MONOS PCT: 6 % (ref 3.0–12.0)
Monocytes Absolute: 0.4 10*3/uL (ref 0.1–1.0)
Neutro Abs: 2.8 10*3/uL (ref 1.4–7.7)
Neutrophils Relative %: 43.5 % (ref 43.0–77.0)
Platelets: 156 10*3/uL (ref 150.0–400.0)
RBC: 3.52 Mil/uL — AB (ref 3.87–5.11)
RDW: 13.3 % (ref 11.5–15.5)
WBC: 6.5 10*3/uL (ref 4.0–10.5)

## 2014-05-28 LAB — COMPREHENSIVE METABOLIC PANEL
ALK PHOS: 65 U/L (ref 39–117)
ALT: 12 U/L (ref 0–35)
AST: 18 U/L (ref 0–37)
Albumin: 3.9 g/dL (ref 3.5–5.2)
BUN: 25 mg/dL — AB (ref 6–23)
CALCIUM: 9.4 mg/dL (ref 8.4–10.5)
CO2: 27 mEq/L (ref 19–32)
Chloride: 102 mEq/L (ref 96–112)
Creatinine, Ser: 0.9 mg/dL (ref 0.4–1.2)
GFR: 66.92 mL/min (ref >60.00)
GLUCOSE: 112 mg/dL — AB (ref 70–99)
POTASSIUM: 4.1 meq/L (ref 3.5–5.1)
Sodium: 135 mEq/L (ref 135–145)
Total Bilirubin: 0.4 mg/dL (ref 0.2–1.2)
Total Protein: 6.4 g/dL (ref 6.0–8.3)

## 2014-05-28 LAB — PROTIME-INR
INR: 1.4 ratio — ABNORMAL HIGH (ref 0.8–1.0)
Prothrombin Time: 15.5 s — ABNORMAL HIGH (ref 9.6–13.1)

## 2014-05-28 LAB — LIPID PANEL
CHOL/HDL RATIO: 2
Cholesterol: 138 mg/dL (ref 0–200)
HDL: 57.2 mg/dL (ref >39.00)
LDL Cholesterol: 68 mg/dL (ref 0–99)
NonHDL: 80.8
Triglycerides: 62 mg/dL (ref 0.0–149.0)
VLDL: 12.4 mg/dL (ref 0.0–40.0)

## 2014-05-28 NOTE — Addendum Note (Signed)
Addended by: Johnsie Cancel on: 05/28/2014 01:55 PM   Modules accepted: Orders

## 2014-06-01 ENCOUNTER — Telehealth: Payer: Self-pay | Admitting: Internal Medicine

## 2014-06-01 MED ORDER — WARFARIN SODIUM 1 MG PO TABS: ORAL_TABLET | ORAL | Status: DC

## 2014-06-01 NOTE — Telephone Encounter (Signed)
Patient notified and voiced understanding and refilled 1 mg coumadin.

## 2014-06-01 NOTE — Telephone Encounter (Signed)
Increase dose to 6 mg daily and recheck in one week,  Can send in a 1 mg coumadin rx for #30 if needed

## 2014-06-08 ENCOUNTER — Other Ambulatory Visit (INDEPENDENT_AMBULATORY_CARE_PROVIDER_SITE_OTHER): Payer: Medicare Other

## 2014-06-08 DIAGNOSIS — Z7901 Long term (current) use of anticoagulants: Secondary | ICD-10-CM

## 2014-06-08 LAB — PROTIME-INR
INR: 3.5 ratio — ABNORMAL HIGH (ref 0.8–1.0)
PROTHROMBIN TIME: 37.5 s — AB (ref 9.6–13.1)

## 2014-06-10 ENCOUNTER — Telehealth: Payer: Self-pay

## 2014-06-10 ENCOUNTER — Emergency Department: Payer: Self-pay | Admitting: Emergency Medicine

## 2014-06-10 LAB — CBC WITH DIFFERENTIAL/PLATELET
Basophil #: 0.1 10*3/uL (ref 0.0–0.1)
Basophil %: 0.6 %
EOS ABS: 0 10*3/uL (ref 0.0–0.7)
EOS PCT: 0.4 %
HCT: 33.7 % — ABNORMAL LOW (ref 35.0–47.0)
HGB: 10.9 g/dL — ABNORMAL LOW (ref 12.0–16.0)
LYMPHS ABS: 2.2 10*3/uL (ref 1.0–3.6)
LYMPHS PCT: 21.4 %
MCH: 32.4 pg (ref 26.0–34.0)
MCHC: 32.4 g/dL (ref 32.0–36.0)
MCV: 100 fL (ref 80–100)
MONO ABS: 0.4 x10 3/mm (ref 0.2–0.9)
Monocyte %: 3.8 %
Neutrophil #: 7.5 10*3/uL — ABNORMAL HIGH (ref 1.4–6.5)
Neutrophil %: 73.8 %
Platelet: 119 10*3/uL — ABNORMAL LOW (ref 150–440)
RBC: 3.37 10*6/uL — AB (ref 3.80–5.20)
RDW: 13.2 % (ref 11.5–14.5)
WBC: 10.2 10*3/uL (ref 3.6–11.0)

## 2014-06-10 NOTE — Telephone Encounter (Signed)
I have not seen her INR results. I have looked through both yellow folders,  And I do no t have it,  Can you tell us the value of the INR?  T

## 2014-06-10 NOTE — Telephone Encounter (Signed)
Patient stated she dropped off her Pt/INR results a few days ago and wanted to know if she needed any changes in her coumadin dose. Please advise.

## 2014-06-10 NOTE — Telephone Encounter (Signed)
The patient is hoping to get the dosage for her coumadin medication.   Pt callback - [phone]

## 2014-06-10 NOTE — Telephone Encounter (Signed)
Just spoke to patient and she stated that she mis spoke. Labs were done here in office on 06/08/14 results are in epic please advise on dosage.

## 2014-06-11 LAB — BASIC METABOLIC PANEL
ANION GAP: 8 (ref 7–16)
BUN: 25 mg/dL — AB (ref 7–18)
CALCIUM: 8.7 mg/dL (ref 8.5–10.1)
CHLORIDE: 106 mmol/L (ref 98–107)
CO2: 26 mmol/L (ref 21–32)
CREATININE: 1.09 mg/dL (ref 0.60–1.30)
EGFR (African American): >60
EGFR (Non-African Amer.): 51 — ABNORMAL LOW
GLUCOSE: 105 mg/dL — AB (ref 65–99)
Osmolality: 284 (ref 275–301)
Potassium: 3.8 mmol/L (ref 3.5–5.1)
SODIUM: 140 mmol/L (ref 136–145)

## 2014-06-11 LAB — URINALYSIS, COMPLETE
BLOOD: NEGATIVE
Bacteria: NONE SEEN
Bilirubin,UR: NEGATIVE
Glucose,UR: NEGATIVE mg/dL (ref 0–75)
Hyaline Cast: 2
KETONE: NEGATIVE
NITRITE: NEGATIVE
Ph: 5 (ref 4.5–8.0)
Protein: NEGATIVE
RBC,UR: 2 /HPF (ref 0–5)
Specific Gravity: 1.023 (ref 1.003–1.030)
Squamous Epithelial: <1
WBC UR: 63 /HPF (ref 0–5)

## 2014-06-11 LAB — PROTIME-INR
INR: 2.4
Prothrombin Time: 25.2 secs — ABNORMAL HIGH (ref 11.5–14.7)

## 2014-06-11 LAB — TROPONIN I: Troponin-I: <0.02 ng/mL

## 2014-06-12 DIAGNOSIS — S065X9A Traumatic subdural hemorrhage with loss of consciousness of unspecified duration, initial encounter: Secondary | ICD-10-CM | POA: Insufficient documentation

## 2014-06-12 DIAGNOSIS — S065XAA Traumatic subdural hemorrhage with loss of consciousness status unknown, initial encounter: Secondary | ICD-10-CM | POA: Insufficient documentation

## 2014-06-14 DIAGNOSIS — Z8673 Personal history of transient ischemic attack (TIA), and cerebral infarction without residual deficits: Secondary | ICD-10-CM | POA: Insufficient documentation

## 2014-06-17 ENCOUNTER — Telehealth: Payer: Self-pay | Admitting: Internal Medicine

## 2014-06-17 NOTE — Telephone Encounter (Signed)
The patient fell on Christmas eve she called this morning wanting a hospital follow up appointment. She discharged herself from the hospital on 12.29.15.

## 2014-06-21 MED ORDER — OXYCODONE-ACETAMINOPHEN 5-300 MG PO TABS: 1.0000 | ORAL_TABLET | ORAL | Status: DC | PRN

## 2014-06-21 MED ORDER — OXYCODONE-ACETAMINOPHEN 5-325 MG PO TABS: 1.0000 | ORAL_TABLET | ORAL | Status: DC | PRN

## 2014-06-21 NOTE — Telephone Encounter (Signed)
I HAVE REFILLED THE OXYCODONE TO TAKE UP TO 3 TIMES DAILY.  REMIND HER THAT NO COUMADIN OR ASPIRIN PER [facility]

## 2014-06-21 NOTE — Telephone Encounter (Signed)
Was in Hospital at [facility] for fall and was discharged to [facility] center and signed herself out needs follow up. Patient reporting she was Diagnosed with a contusion to left side of brain and is having Headaches rated at 6 on a scale of 0-10. Have scheduled patient for an appointment on Friday first available, But patient is wanting to be seen sooner due to headache. Patient stated that [facility] had prescribed Oxycontin  For pain but was not filled at the rehab center she signed herself out of.  Please advise.

## 2014-06-21 NOTE — Telephone Encounter (Signed)
Patient notified and placed at front desk for pick up patient stated she was aware of no coumadin or aspirin.

## 2014-06-22 ENCOUNTER — Telehealth: Payer: Self-pay | Admitting: Internal Medicine

## 2014-06-22 NOTE — Telephone Encounter (Signed)
If she is feeling better,  No,  If the heaaches get worse,  She should be worked in,

## 2014-06-22 NOTE — Telephone Encounter (Signed)
The patient was scheduled for a hospital follow up on 1.8.16 and had to be canceled . She has an appointment on 1.22.16. The pain medication is helping. Should the patient be seen in the office before 1.22.16.

## 2014-06-23 NOTE — Telephone Encounter (Signed)
Pt notified. At this time, she would like to wait until the appt on 07/09/14, will call back if feels she needs to be seen sooner.

## 2014-06-25 ENCOUNTER — Ambulatory Visit: Payer: Medicare Other | Admitting: Internal Medicine

## 2014-07-07 ENCOUNTER — Telehealth: Payer: Self-pay | Admitting: Internal Medicine

## 2014-07-07 NOTE — Telephone Encounter (Signed)
Patient notified and stated she will call her neurologist.

## 2014-07-07 NOTE — Telephone Encounter (Signed)
Patient has been off coumadin for Approx. 4 weeks,with last regimen being 6 mg daily. Patient concerned about being re-examined after hematoma.

## 2014-07-07 NOTE — Telephone Encounter (Signed)
I do not have a discharge summary from [facility],  But the decision to resume coumadin is one that would be made by the [facility] neurologist/nurosurgeon and/or her cardiologist,  Not me

## 2014-07-07 NOTE — Telephone Encounter (Signed)
The patient was bleeding in her head during christmas after her fall . She has been off her coumadin since 12.25.15. Patient is wanting to know what to do next.

## 2014-07-08 ENCOUNTER — Ambulatory Visit: Payer: Medicare Other | Admitting: Nurse Practitioner

## 2014-07-09 ENCOUNTER — Ambulatory Visit: Payer: Medicare Other | Admitting: Internal Medicine

## 2014-07-09 ENCOUNTER — Ambulatory Visit: Payer: Medicare Other | Admitting: Nurse Practitioner

## 2014-07-12 ENCOUNTER — Ambulatory Visit: Payer: Medicare Other | Admitting: Nurse Practitioner

## 2014-07-12 ENCOUNTER — Telehealth: Payer: Self-pay | Admitting: Internal Medicine

## 2014-07-12 DIAGNOSIS — Z7901 Long term (current) use of anticoagulants: Secondary | ICD-10-CM

## 2014-07-12 NOTE — Telephone Encounter (Signed)
Patient neurologist called and stated fine to restart coumadin last 6 mg daily , please advise.

## 2014-07-12 NOTE — Telephone Encounter (Signed)
Ok.  You can tell Alicia Moran that she can resume 6 mg coumadin daily and we will check her INR on thursday

## 2014-07-13 NOTE — Telephone Encounter (Signed)
Please change the appointments for me patient has been notified.

## 2014-07-13 NOTE — Telephone Encounter (Signed)
Patient is notified.

## 2014-07-13 NOTE — Telephone Encounter (Signed)
She won't like that.  You can change her to 1:00 pm on wednesday  The 27th with me.

## 2014-07-13 NOTE — Telephone Encounter (Signed)
Patient notified can patient Have Inr rechecked Wednesday she is seeing [facility] for [facility] follow-up?

## 2014-07-14 ENCOUNTER — Ambulatory Visit (INDEPENDENT_AMBULATORY_CARE_PROVIDER_SITE_OTHER): Payer: Medicare Other | Admitting: Internal Medicine

## 2014-07-14 ENCOUNTER — Encounter: Payer: Self-pay | Admitting: Internal Medicine

## 2014-07-14 ENCOUNTER — Ambulatory Visit: Payer: Medicare Other | Admitting: Nurse Practitioner

## 2014-07-14 VITALS — BP 130/72 | HR 72 | Resp 14 | Ht 65.0 in | Wt 136.2 lb

## 2014-07-14 DIAGNOSIS — D500 Iron deficiency anemia secondary to blood loss (chronic): Secondary | ICD-10-CM

## 2014-07-14 DIAGNOSIS — R195 Other fecal abnormalities: Secondary | ICD-10-CM

## 2014-07-14 DIAGNOSIS — Z9181 History of falling: Secondary | ICD-10-CM

## 2014-07-14 DIAGNOSIS — I70209 Unspecified atherosclerosis of native arteries of extremities, unspecified extremity: Secondary | ICD-10-CM

## 2014-07-14 DIAGNOSIS — Z79899 Other long term (current) drug therapy: Secondary | ICD-10-CM

## 2014-07-14 DIAGNOSIS — S3992XS Unspecified injury of lower back, sequela: Secondary | ICD-10-CM

## 2014-07-14 DIAGNOSIS — R296 Repeated falls: Secondary | ICD-10-CM

## 2014-07-14 DIAGNOSIS — S72141S Displaced intertrochanteric fracture of right femur, sequela: Secondary | ICD-10-CM

## 2014-07-14 DIAGNOSIS — Z780 Asymptomatic menopausal state: Secondary | ICD-10-CM

## 2014-07-14 DIAGNOSIS — F4323 Adjustment disorder with mixed anxiety and depressed mood: Secondary | ICD-10-CM

## 2014-07-14 DIAGNOSIS — I739 Peripheral vascular disease, unspecified: Secondary | ICD-10-CM

## 2014-07-14 DIAGNOSIS — M533 Sacrococcygeal disorders, not elsewhere classified: Secondary | ICD-10-CM

## 2014-07-14 DIAGNOSIS — Z7901 Long term (current) use of anticoagulants: Secondary | ICD-10-CM

## 2014-07-14 DIAGNOSIS — E785 Hyperlipidemia, unspecified: Secondary | ICD-10-CM

## 2014-07-14 DIAGNOSIS — Z23 Encounter for immunization: Secondary | ICD-10-CM

## 2014-07-14 MED ORDER — WARFARIN SODIUM 5 MG PO TABS: ORAL_TABLET | ORAL | Status: DC

## 2014-07-14 MED ORDER — DIAZEPAM 5 MG PO TABS: 5.0000 mg | ORAL_TABLET | Freq: Two times a day (BID) | ORAL | Status: DC | PRN

## 2014-07-14 NOTE — Progress Notes (Signed)
Pre visit review using our clinic review tool, if applicable. No additional management support is needed unless otherwise documented below in the visit note. 

## 2014-07-14 NOTE — Patient Instructions (Addendum)
Continue 5 mg coumadin daily and we will  repeat your  INR on Friday or Monday along with  fasting labs to be done then   I am making a Home Health  PT referral fto continue to work on your balance ([facility])   Stop taking the alprazolam and  Try the diazepam instead for your anxiety   You should have your carotid arteries rechecked;  This requires a referral to [facility]

## 2014-07-14 NOTE — Progress Notes (Signed)
Patient ID: Alicia Moran, female   DOB: 1929-01-12, 81 y.o.   MRN: [id number]   Patient Active Problem List   Diagnosis Date Noted  . History of falling, presenting hazards to health 07/17/2014  . Pain, coccyx 07/17/2014  . Anemia due to chronic blood loss 07/17/2014  . Positive FIT (fecal immunochemical test) 07/17/2014  . Atherosclerotic peripheral vascular disease 07/17/2014  . Adjustment disorder with mixed anxiety and depressed mood 10/31/2013  . History of Clostridium difficile colitis 03/05/2013  . Intertrochanteric fracture of right hip 03/05/2013  . Urinary frequency 09/07/2012  . Screening for colon cancer 12/24/2011  . Long term current use of anticoagulant therapy 04/01/2011  . Atrial fibrillation   . Stroke   . Migraine headache   . Hyperlipidemia     Subjective:  CC:   Chief Complaint  Patient presents with  . Follow-up    hospital followup. Pain in ribs, buttocks slight headache    HPI:   Alicia Moran is a 81 y.o. female who presents for    Hospital follow up. Patient was admitted to [facility] ICU via transfer from [facility] ED on Dec 24th for management of a subdural hermatoma which she sustained after experiencing blunt head trauma during a fall at home. Patient states that she Hit her head against the window when she fell backward after losing her balance.  Her coumadin was reversed with Vitamin K and she was transferred without incident on Dec 29 from [facility] to  [place], but stayed only one night due to the conditions being unacceptable.  She states that her roommate 's colostomy bag was repeatedly changed by the  staff at mealtime, which made her lose her appetite due to the stench in the room,  So she decided to return home after missing two meals .  PT was not arranged for her prior to her leaving. She has been trying to walk daily at home but cannot tolerate more that 10-15 minutes of exercise due to legs feeling weak,   Her also reports persistent pain on her tailbone which has been present since her fall.  Review of the [facility] records show no evidence that her sacrum was x rayed.    She resumed her coumadin for valvular atrial fib with history of CVA in 2008 on Monday Jan 25th.    History of positive FOBT:  Her Last colonoscopy was normal in 2005 in [place] and has not been repeated due to age,  But she has a FH of colon CA in father and her last FOBT was positive June 2015 in the setting of chronic anticoagulation , chronic constipation and mild iron deficiency anemia   Anemia of chronic blood loss:  She has not tolerated iron supplementation due to nausea.  Her last known hgb was 10.9 on Dec 25th at [facility].    PAD:  During placement of myringotomy tube in April, with follow up in October,  She reported persistent feeling of pulsation  in right ear to ENT and was advised to see Vascular Surgery . She has a history of  50% carotid stenosis by 2008 carotid doppler done during admission for CVA but has not had serial surveillance      Past Medical History  Diagnosis Date  . Atrial fibrillation   . Migraine headache   . Hyperlipidemia   . Stroke     No past surgical history on file.     The following portions of the patient's history  were reviewed and updated as appropriate: Allergies, current medications, and problem list.    Review of Systems:   Patient denies headache, fevers, malaise, unintentional weight loss, skin rash, eye pain, sinus congestion and sinus pain, sore throat, dysphagia,  hemoptysis , cough, dyspnea, wheezing, chest pain, palpitations, orthopnea, edema, abdominal pain, nausea, melena, diarrhea, constipation, flank pain, dysuria, hematuria, urinary  Frequency, nocturia, numbness, tingling, seizures,  Focal weakness, Loss of consciousness,  Tremor, insomnia, depression, anxiety, and suicidal ideation.     History   Social History  . Marital Status: Married    Spouse Name:  N/A    Number of Children: N/A  . Years of Education: N/A   Occupational History  . Not on file.   Social History Main Topics  . Smoking status: Former Smoker    Quit date: 03/29/1986  . Smokeless tobacco: Never Used  . Alcohol Use: No  . Drug Use: No  . Sexual Activity: Not on file   Other Topics Concern  . Not on file   Social History Narrative    Objective:  Filed Vitals:   07/14/14 1257  BP: 130/72  Pulse: 72  Resp: 14     General appearance: alert, cooperative and appears stated age Ears: normal TM's and external ear canals both ears Throat: lips, mucosa, and tongue normal; teeth and gums normal Neck: no adenopathy, no carotid bruit, supple, symmetrical, trachea midline and thyroid not enlarged, symmetric, no tenderness/mass/nodules Back: symmetric, no curvature. ROM normal. No CVA tenderness. Lungs: clear to auscultation bilaterally Heart: regular rate and rhythm, S1, S2 normal, no murmur, click, rub or gallop Abdomen: soft, non-tender; bowel sounds normal; no masses,  no organomegaly Pulses: 2+ and symmetric Skin: Skin color, texture, turgor normal. No rashes or lesions Lymph nodes: Cervical, supraclavicular, and axillary nodes normal.  Assessment and Plan:  History of falling, presenting hazards to health She has had two catastrophic falls in ghe last 1.5 yars.  She is not able to drive because she has been using a walker since her hip fracture and her last fall occurred when she walked away from her walker.  She was advised of the risks of life threatening hemorrhage with continued use of coumadin and prefers to continue use of warfarin, because she fears another CVA more .  Home PT was recommended duirng rehab but was not ordered.  evaluation ordered for balance and gait,  And aim for INR of 2.0    Intertrochanteric fracture of right hip S/p r hip replacement August 2014 .  Needs bone density evaluation.    Pain, coccyx Sacral films were done in ED on  Dec 24th along with r hip and left foot, all of which were negative for fracture she has persistent coccygeal pain  Plain films ordered. And PT evaluation which may be able to provide donut shaped pillow for comfort.    Hyperlipidemia Managed with low potency statin, .  TOLERATED,  No changes today  Lab Results  Component Value Date   CHOL 128 07/16/2014   HDL 51.00 07/16/2014   LDLCALC 62 07/16/2014   TRIG 75.0 07/16/2014   CHOLHDL 3 07/16/2014   Lab Results  Component Value Date   ALT 8 07/16/2014   AST 14 07/16/2014   ALKPHOS 94 07/16/2014   BILITOT 0.6 07/16/2014      Adjustment disorder with mixed anxiety and depressed mood She has been having increased anxiety throughout the day not managed with alprazolam.  Low dose valium trial offered.  Long term current use of anticoagulant therapy I have reduced her daily dose of coumadin to 5 mg daily. Repeat INR after 4 days of therapy is 1.5. Will repeat in one week ,  Goal INR 2.0   Lab Results  Component Value Date   INR 1.5* 07/16/2014   INR 3.5* 06/08/2014   INR 1.4* 05/28/2014      Positive FIT (fecal immunochemical test) In the setting of chronic anticoagulation, chronic anemia, and FH colon Ca. In father.  Discussed with patient that if she is  repeatedly positive will refer to GI for evaluation.    Atherosclerotic peripheral vascular disease Referral to [facility] for evaluation of carotid arteries given persistent sensation of pulsations in right ear and prior nonsignifcant stenoses noted.    A total of 40 minutes was spent with patient more than half of which was spent in counseling patient on the above mentioned issues , reviewing and explaining recent labs and imaging studies done, and coordination of care.  Updated Medication List Outpatient Encounter Prescriptions as of 07/14/2014  Medication Sig  . ALPRAZolam (XANAX) 0.25 MG tablet TAKE ONE TABLET TWICE A DAY IF NEEDED FOR ANXIETY  . Cholecalciferol (VITAMIN  D3) 3000 UNITS TABS Take 2,000 Units by mouth daily.  . Coenzyme Q10 (COQ10) 50 MG CAPS Take 1 capsule by mouth daily.  . famotidine (PEPCID) 10 MG tablet Take 10 mg by mouth as needed for heartburn or indigestion.  . Lactobacillus-Inulin (CULTURELLE DIGESTIVE HEALTH) CAPS Take 1 capsule by mouth daily.  . metoprolol succinate (TOPROL-XL) 25 MG 24 hr tablet TAKE 1 TABLET EVERY DAY  . oxyCODONE-acetaminophen (PERCOCET/ROXICET) 5-325 MG per tablet Take 1 tablet by mouth every 4 (four) hours as needed for severe pain.  . simvastatin (ZOCOR) 10 MG tablet TAKE 1 TABLET AT BEDTIME  . warfarin (COUMADIN) 5 MG tablet daily  . [DISCONTINUED] warfarin (COUMADIN) 1 MG tablet Take as directed  . [DISCONTINUED] warfarin (COUMADIN) 5 MG tablet 6 mg on Mon, Wed, Frday and Sunday,  5 mg all other days (Patient taking differently: 6 mg, 5 days a week. ,  5 mg, other 2 days)  . diazepam (VALIUM) 5 MG tablet Take 1 tablet (5 mg total) by mouth every 12 (twelve) hours as needed for anxiety.  . [DISCONTINUED] co-enzyme Q-10 30 MG capsule Take 50 mg by mouth daily.  . [DISCONTINUED] escitalopram (LEXAPRO) 10 MG tablet Take 1 tablet (10 mg total) by mouth daily.  . [DISCONTINUED] famotidine (PEPCID) 40 MG tablet Take 40 mg by mouth daily.     Orders Placed This Encounter  Procedures  . Fecal occult blood, imunochemical  . DG Sacrum/Coccyx  . Pneumococcal conjugate vaccine 13-valent  . CBC with Differential/Platelet  . Comprehensive metabolic panel  . Lipid panel  . Ambulatory referral to Vascular Surgery    No Follow-up on file.

## 2014-07-16 ENCOUNTER — Other Ambulatory Visit (INDEPENDENT_AMBULATORY_CARE_PROVIDER_SITE_OTHER): Payer: Medicare Other

## 2014-07-16 ENCOUNTER — Other Ambulatory Visit: Payer: Medicare Other

## 2014-07-16 DIAGNOSIS — Z79899 Other long term (current) drug therapy: Secondary | ICD-10-CM

## 2014-07-16 DIAGNOSIS — E785 Hyperlipidemia, unspecified: Secondary | ICD-10-CM

## 2014-07-16 DIAGNOSIS — Z7901 Long term (current) use of anticoagulants: Secondary | ICD-10-CM

## 2014-07-16 LAB — LIPID PANEL
CHOL/HDL RATIO: 3
CHOLESTEROL: 128 mg/dL (ref 0–200)
HDL: 51 mg/dL (ref >39.00)
LDL Cholesterol: 62 mg/dL (ref 0–99)
NONHDL: 77
Triglycerides: 75 mg/dL (ref 0.0–149.0)
VLDL: 15 mg/dL (ref 0.0–40.0)

## 2014-07-16 LAB — COMPREHENSIVE METABOLIC PANEL
ALK PHOS: 94 U/L (ref 39–117)
ALT: 8 U/L (ref 0–35)
AST: 14 U/L (ref 0–37)
Albumin: 4.2 g/dL (ref 3.5–5.2)
BILIRUBIN TOTAL: 0.6 mg/dL (ref 0.2–1.2)
BUN: 22 mg/dL (ref 6–23)
CHLORIDE: 102 meq/L (ref 96–112)
CO2: 27 meq/L (ref 19–32)
Calcium: 9.6 mg/dL (ref 8.4–10.5)
Creatinine, Ser: 0.79 mg/dL (ref 0.40–1.20)
GFR: 73.79 mL/min (ref >60.00)
Glucose, Bld: 89 mg/dL (ref 70–99)
POTASSIUM: 4.3 meq/L (ref 3.5–5.1)
Sodium: 137 mEq/L (ref 135–145)
TOTAL PROTEIN: 6.8 g/dL (ref 6.0–8.3)

## 2014-07-16 LAB — CBC WITH DIFFERENTIAL/PLATELET
BASOS PCT: 0.6 % (ref 0.0–3.0)
Basophils Absolute: 0 10*3/uL (ref 0.0–0.1)
Eosinophils Absolute: 0.1 10*3/uL (ref 0.0–0.7)
Eosinophils Relative: 1.1 % (ref 0.0–5.0)
HEMATOCRIT: 31.3 % — AB (ref 36.0–46.0)
HEMOGLOBIN: 10.6 g/dL — AB (ref 12.0–15.0)
LYMPHS PCT: 44.8 % (ref 12.0–46.0)
Lymphs Abs: 2.6 10*3/uL (ref 0.7–4.0)
MCHC: 33.9 g/dL (ref 30.0–36.0)
MCV: 94.2 fl (ref 78.0–100.0)
MONOS PCT: 5.7 % (ref 3.0–12.0)
Monocytes Absolute: 0.3 10*3/uL (ref 0.1–1.0)
Neutro Abs: 2.8 10*3/uL (ref 1.4–7.7)
Neutrophils Relative %: 47.8 % (ref 43.0–77.0)
Platelets: 142 10*3/uL — ABNORMAL LOW (ref 150.0–400.0)
RBC: 3.32 Mil/uL — ABNORMAL LOW (ref 3.87–5.11)
RDW: 14.3 % (ref 11.5–15.5)
WBC: 5.8 10*3/uL (ref 4.0–10.5)

## 2014-07-16 LAB — PROTIME-INR
INR: 1.5 ratio — ABNORMAL HIGH (ref 0.8–1.0)
PROTHROMBIN TIME: 17 s — AB (ref 9.6–13.1)

## 2014-07-17 ENCOUNTER — Other Ambulatory Visit: Payer: Self-pay | Admitting: Internal Medicine

## 2014-07-17 ENCOUNTER — Encounter: Payer: Self-pay | Admitting: Internal Medicine

## 2014-07-17 DIAGNOSIS — D500 Iron deficiency anemia secondary to blood loss (chronic): Secondary | ICD-10-CM | POA: Insufficient documentation

## 2014-07-17 DIAGNOSIS — R195 Other fecal abnormalities: Secondary | ICD-10-CM | POA: Insufficient documentation

## 2014-07-17 DIAGNOSIS — M533 Sacrococcygeal disorders, not elsewhere classified: Secondary | ICD-10-CM | POA: Insufficient documentation

## 2014-07-17 DIAGNOSIS — Z9181 History of falling: Secondary | ICD-10-CM | POA: Insufficient documentation

## 2014-07-17 DIAGNOSIS — Z7901 Long term (current) use of anticoagulants: Secondary | ICD-10-CM

## 2014-07-17 DIAGNOSIS — I70209 Unspecified atherosclerosis of native arteries of extremities, unspecified extremity: Secondary | ICD-10-CM | POA: Insufficient documentation

## 2014-07-17 NOTE — Assessment & Plan Note (Addendum)
S/p r hip replacement August 2014 .  Needs bone density evaluation.

## 2014-07-17 NOTE — Assessment & Plan Note (Signed)
Managed with low potency statin, .  TOLERATED,  No changes today  Lab Results  Component Value Date   CHOL 128 07/16/2014   HDL 51.00 07/16/2014   LDLCALC 62 07/16/2014   TRIG 75.0 07/16/2014   CHOLHDL 3 07/16/2014   Lab Results  Component Value Date   ALT 8 07/16/2014   AST 14 07/16/2014   ALKPHOS 94 07/16/2014   BILITOT 0.6 07/16/2014

## 2014-07-17 NOTE — Assessment & Plan Note (Signed)
I have reduced her daily dose of coumadin to 5 mg daily. Repeat INR after 4 days of therapy is 1.5. Will repeat in one week ,  Goal INR 2.0   Lab Results  Component Value Date   INR 1.5* 07/16/2014   INR 3.5* 06/08/2014   INR 1.4* 05/28/2014

## 2014-07-17 NOTE — Assessment & Plan Note (Signed)
In the setting of chronic anticoagulation, chronic anemia, and FH colon Ca. In father.  Discussed with patient that if she is  repeatedly positive will refer to GI for evaluation.

## 2014-07-17 NOTE — Assessment & Plan Note (Addendum)
She has had two catastrophic falls in ghe last 1.5 yars.  She is not able to drive because she has been using a walker since her hip fracture and her last fall occurred when she walked away from her walker.  She was advised of the risks of life threatening hemorrhage with continued use of coumadin and prefers to continue use of warfarin, because she fears another CVA more .  Home PT was recommended duirng rehab but was not ordered.  evaluation ordered for balance and gait,  And aim for INR of 2.0

## 2014-07-17 NOTE — Assessment & Plan Note (Signed)
She has been having increased anxiety throughout the day not managed with alprazolam.  Low dose valium trial offered.

## 2014-07-17 NOTE — Assessment & Plan Note (Addendum)
Referral to [facility] for evaluation of carotid arteries given persistent sensation of pulsations in right ear and prior nonsignifcant stenoses noted. She has a history of  50% carotid stenosis by 2008 carotid doppler done during admission for CVA but has not had serial surveillance

## 2014-07-17 NOTE — Assessment & Plan Note (Signed)
Sacral films were done in ED on Dec 24th along with r hip and left foot, all of which were negative for fracture she has persistent coccygeal pain  Plain films ordered. And PT evaluation which may be able to provide donut shaped pillow for comfort.

## 2014-07-19 ENCOUNTER — Ambulatory Visit: Payer: Self-pay | Admitting: Internal Medicine

## 2014-07-20 ENCOUNTER — Telehealth: Payer: Self-pay | Admitting: Internal Medicine

## 2014-07-20 NOTE — Telephone Encounter (Signed)
Patient very concerned about making the decision as to stop coumadin, patient feels should be a MD decision. Patient is also concerned as to  should she have repeat CT for the subdural hematoma? Patient stated this lower INR goal was advised at visit and is your recommendation to continue for now? Please advise.

## 2014-07-20 NOTE — Telephone Encounter (Signed)
Patient has decided to stop the coumadin until she sees her cardiologist at [facility] heart care next Wednesday 07/28/14

## 2014-07-20 NOTE — Telephone Encounter (Signed)
She does not need a repeat head CT unless she is still having headaches. If she stops it based on my advice and has a stroke, she will blame me.  If she continues it and has another fall with head injury, she could have another intracranial bleed.  It's a tough call   I cannot make that decision for her because I cannot predict  The future..  That's why I opted for continuining coumadin with a goal to keep her iNR around 2.0 (on the lower end)

## 2014-07-20 NOTE — Telephone Encounter (Signed)
X rays were negative for tailbone fracture,  2) regarding her note,  A referral to a neurologist will not alleviate or absolve [place] of making the decision about whether to continue coumadin . Given the fact that she has already had a fall resulting in a subdural hematoma, they will not advocate for continuing coumadin, most likely, but the ultimate decision is hers.  There are no guarantees either way .  If she believes she can lower her isk of falling by being more careful, then she can continue it, and we will aim for a lower INR of 2.0

## 2014-07-30 ENCOUNTER — Other Ambulatory Visit: Payer: Self-pay | Admitting: Internal Medicine

## 2014-08-06 ENCOUNTER — Encounter: Payer: Self-pay | Admitting: Internal Medicine

## 2014-08-20 ENCOUNTER — Telehealth: Payer: Self-pay | Admitting: Internal Medicine

## 2014-08-20 DIAGNOSIS — S065XAA Traumatic subdural hemorrhage with loss of consciousness status unknown, initial encounter: Secondary | ICD-10-CM

## 2014-08-20 DIAGNOSIS — S065X9A Traumatic subdural hemorrhage with loss of consciousness of unspecified duration, initial encounter: Secondary | ICD-10-CM

## 2014-08-20 NOTE — Telephone Encounter (Signed)
I can refer her to Dr Manuella Ghazi at [facility],.  wha tis the reason for the referral? Is it for "history of subdural hematoma?"

## 2014-08-20 NOTE — Telephone Encounter (Signed)
Noted D/c of coumadin thanks, patient would like to see Dr. Manuella Ghazi and yes for the  Subdural hematoma  Per patient.

## 2014-08-20 NOTE — Telephone Encounter (Signed)
Referral is in process as requested 

## 2014-08-20 NOTE — Telephone Encounter (Signed)
Patient saw cardiology on 07/28/14 Dr. Wynelle Fanny at [facility] and was advised to stop coumadin and to start 81 mg aspirin per day , patient stated she is following advice.   Cardiology also advised patient she should see neurology, but he wanted her Primary to set up due to he does not know a Neurologist in this area. Patient does not want to return to same neurologist at [facility] neurology would like MD recommendation.

## 2014-09-28 ENCOUNTER — Ambulatory Visit
Admit: 2014-09-28 | Disposition: A | Discharge status: Home / Self Care | Payer: Self-pay | Attending: Neurology | Admitting: Neurology

## 2014-10-08 NOTE — Discharge Summary (Signed)
PATIENT NAME:  Alicia Moran, Alicia Moran MR#:  [id number] DATE OF BIRTH:  29-Sep-1928  DATE OF ADMISSION:  01/18/2013 DATE OF DISCHARGE:  01/23/2013.   DISCHARGE DIAGNOSES:  1.  Hip fracture.  2.  A history of atrial fibrillation.  3.  Anxiety.  4.  Acute posthemorrhagic anemia.  5.  Stroke   CONDITION ON DISCHARGE:  Stable.   DISCHARGE MEDICATIONS:  1.  Co-enzyme Q-10, 50 mg oral capsule once a day.  2.  Cosamine Double strength 400 + 500 mg oral capsule once a day.  3.  Metoprolol extended-release 25 mg once a day at lunch.  4.  Simvastatin 10 mg once a day.  5.  Docusate calcium 240 mg oral capsule once a day at bedtime.  6.  Acetaminophen 325 mg every 4 hours for temperature more than 100.5.  7.  Ferrous sulfate 325 mg orally 2 times a day with meals.  8.  Bisacodyl 10 mg rectal suppository once a day at bedtime as needed for constipation.  9.  Calcium and vitamin D tablet 2 times a day.  10.  Acetaminophen and hydrocodone every 6 hours as needed for pain.  11.  Alprazolam 0.25 mg oral tablet once a day.  12.  Docusate and Senna once a day.  13.  Warfarin 1 mg tablets, take 4 tablets once a day, and be advised to follow INR level in next 4 to 5 days at [facility] and discuss the result with PCP. The target level is between two and three.   HISTORY OF PRESENTING ILLNESS:  The patient is an 81 year old female with a history of stroke and multiple TIAs with a-fib, hyperlipidemia. She fell down and had hip fracture. She did not have loss of consciousness, chest tightness, shortness of breath or any other complaint, came to hospital because of fall and found having fracture, admitted to medical service for further management.   HOSPITAL COURSE AND STAY:  The patient was taking Coumadin for her a-fib. INR was 1.7. She was given FFP and surgery was done for her hip fracture. After the surgery, she had some blood loss anemia, and she received 2 units of blood transfusion, and she was restarted on  Lovenox and then switched over to Coumadin after the surgery. She remained stable, received rehab services and physical therapy in the hospital and overall improved. Hemoglobin and INR remained stable and so being discharged to Rehab for further management.   OTHER MEDICAL ISSUES ADDRESSED IN THIS HOSPITAL STAY:  1.  A history of CVA. Coumadin restarted, minimal leftover right-sided weakness, which is stable.  2.  Anxiety. Was on Xanax p.r.n. and we continued, remained stable.  CONSULTATIONS IN THE HOSPITAL:  Dr. Starling Manns came in for Orthopaedics.   IMPORTANT LABORATORY RESULTS:  Urinalysis was negative on presentation. Hip, right complete x-ray, shows intratrochanteric fracture, extension into femoral shaft. Hemoglobin was 10.8, platelet count was 113. BUN was 20, creatinine was 0.82 on presentation. INR was 1.9, INR doubled to 1.3, and hemoglobin dropped to 7 at the time of surgery, and after transfusion hemoglobin dropped again to 7.2, but we transfused one more unit and then it came up to 9.1 and stay at 9.2 the next day followup. INR also gradually came up with restarted Coumadin and came to 2.6 at the time of discharge.   TOTAL TIME SPENT ON THIS DISCHARGE SUMMARY:  40 minutes.   ____________________________ Hope Pigeon Elisabeth Pigeon, MD [id number] D: 01/24/2013 12:36:54 ET T: 01/24/2013 13:07:38 ET JOB#: [id number]  cc: Hope Pigeon. Elisabeth Pigeon, MD, <Dictator> Duncan Dull, MD Altamese Dilling MD ELECTRONICALLY SIGNED 02/03/2013 11:16

## 2014-10-08 NOTE — Consult Note (Signed)
Brief Consult Note: Diagnosis: Right comminuted intertrochanteric hip fracture with subtroch extension.   Patient was seen by consultant.   Consult note dictated.   Orders entered.   Discussed with Attending MD.   Comments: Discussed case with Dr. Bridgette Habermann.  Patient is an 81 year old female who fell at home and sustained a right comminuted intertrochanteric hip fracture.  Patient is on coumadin for afib and prior stroke.  Her INR is 1.9 today.  Vitamin K has been ordered in an effort to prepare surgery tomorrow.  Patient is NPO after midnight.  Surgical site signed as per "right site surgery" protocol.  The risks and benefits of surgical intervention were discussed in detail with the patient and her husband and they patient expressed understanding of the risks and benefits and agreed with plans for surgery.  The risks include, but are not limited to: infection, bleeding requiring transfusion, nerve and blood vessel injury, fracture, leg length discrepancy, change in lower extremity rotation, persistent hip pain, failure of hardware or painful hardware,need for more surgery including conversion to a total hip arthroplasty, DVT, and PE, MI, stroke, pneumonia, respiratory and death.  Electronic Signatures: Thornton Park (MD)  (Signed 03-Aug-14 17:29)  Authored: Brief Consult Note   Last Updated: 03-Aug-14 17:29 by Thornton Park (MD)

## 2014-10-08 NOTE — H&P (Signed)
PATIENT NAME:  Alicia Moran, Alicia Moran MR#:  [id number] DATE OF BIRTH:  01/08/1929  DATE OF ADMISSION:  01/18/2013  REFERRING PHYSICIAN: Dorothea Glassman, MD  PRIMARY CARE PHYSICIAN: Duncan Dull, MD  PRIMARY CARDIOLOGIST: Dewaine Oats, MD, at [facility].   CHIEF COMPLAINT: Status post fall and hip fracture.   HISTORY OF PRESENT ILLNESS: The patient is a pleasant 81 year old Caucasian female with history of strokes x 2 with multiple TIAs, history of A. fib, hyperlipidemia. She stated that she was in her baseline and this morning was cooking. She was looking into cabinets and sustained a fall, resulting in a hip fracture. She had no loss of consciousness. No pains in the chest, shortness of breath or visual issues acutely. She had no pains in the chest post fall. Of note, she does have right-sided weakness after her strokes and she also has a right foot drop. She was wearing her tennis shoes with orthotics when she did fall. She did have right hip pain and came into the hospital and was noted to have a right hip fracture. Hospitalist services were contacted for further evaluation and management. The case has been discussed with Dr. Martha Clan. She is on Coumadin. She will likely go to OR tomorrow.   The patient stated that she has no history of recent MI or CHF. She does have some valvular abnormalities and she gets screened with yearly echoes at [facility] and the last 1 was done about a year ago and she is due to for 1 and tomorrow apparently. She was supposed to see her cardiologist tomorrow but she states that she can go up a flight of stairs and can walk for 10 minutes or so and does mostly her own chores around the house without any shortness of breath or pains in the chest. She last took her Coumadin yesterday and INR is 1.9 today.   PAST MEDICAL HISTORY 1.  TIA, multiple, as well as stroke x 2 with right right-sided hemiparesis and foot drop.  2.  Anxiety.  3.  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  4.  History of A.  fib, currently appears to be sinus.  5.  Hypercholesterolemia.  6.  Osteoporosis.  7.  Arthritis. 8.  History of hemorrhoidectomy, tonsillectomy, appendectomy and breast biopsy.   ALLERGIES: ALPHAGAN, ANTI-INFLAMMATORIES, ERYTHROMYCIN, XALATAN.   SOCIAL HISTORY: No tobacco, alcohol or drug use. Lives with her husband.   OUTPATIENT MEDICATIONS: Alprazolam 0.125 mg as needed daily for nervousness, co-Q-enzyme 10, 50 mg once a day; Cosamin DS the 400/500 mg 1 cap once a day, Coumadin 4 mg once a day at lunchtime, docusate sodium 200 mg at bedtime, metoprolol succinate extended-release 25 mg daily, simvastatin 10 mg daily, vitamin D3 2000 international units once a day.   FAMILY HISTORY: Mom died at age 32. Father and a brother with cancer. Brother with MI.    REVIEW OF SYSTEMS CONSTITUTIONAL: No fevers, fatigue or weakness.  EYES: Has history of glaucoma, occasional blurry vision, no double vision.  ENT: No tinnitus, hearing loss or postnasal drip.  RESPIRATORY: No cough, wheezing, shortness of breath or dyspnea on exertion. No hemoptysis.  CARDIOVASCULAR: Has a history of A. fib, occasional lower extremity edema. Occasional palpitations. No syncope.  GASTROINTESTINAL: No nausea, vomiting, diarrhea, abdominal pain, black stools, dark stools. She denies dysuria or hematuria.  HEMATOLOGIC AND LYMPHATIC: No anemia or easy bruising.  SKIN: No rashes.  MUSCULOSKELETAL: Has  left shoulder pain and currently has right hip pain.  NEUROLOGIC: History of TIAs  and stroke with right-sided hemiparesis and foot drop.  PSYCHIATRIC:  Has Anxiety.   PHYSICAL EXAMINATION VITAL SIGNS: Temperature on arrival 98.2. Initial pulse rate 90, respiratory rate 18, blood pressure 113/70, O2 sat 97% on room air.  GENERAL: The patient is an elderly Caucasian female lying in bed, anxious, in no obvious distress.  HEENT: Normocephalic, atraumatic. Pupils are equal and reactive. Anicteric sclerae. Extraocular muscles  intact. The patient has mild left-sided facial droop.  NECK: Supple. No thyroid tenderness. No cervical lymphadenopathy.  CARDIOVASCULAR:  S1 and S2, irregularly irregular. No significant harsh murmurs appreciated.  LUNGS: Clear to auscultation. No wheezing, rhonchi or rales.  ABDOMEN: Soft, nontender, nondistended. Positive bowel sounds.  EXTREMITIES: No significant lower extremity edema.  NEUROLOGIC: Cranial nerves otherwise appear to be intact II through XII. The patient does have some mild facial droop on the left. Unable to do strength in the lower extremities secondary to pain but moves upper extremities spontaneously.  SKIN: No obvious rashes.   LABORATORY DATA AND IMAGING: Chest x-ray, 1-view, showing no evidence of acute cardiopulmonary disease. Pelvis, AP only, shows right hip fracture. Right hip complete shows  intertrochanteric fracture with extension into the femoral shaft. Glucose 119, BUN 20, creatinine 0.82, sodium 137, potassium 4.1. LFTs within normal limits. Troponin negative. WBC 11.1, hemoglobin 10.8, platelets 113. INR 1.9, PT is 21. Urinalysis not suggestive of infection. EKG normal sinus rhythm, rate is 77, no significant arrhythmias, no acute ST elevations or depressions.   ASSESSMENT AND PLAN: We have an 81 year old female with proximal atrial fibrillation apparently, in sinus now with history of transient ischemic attacks and strokes, hyperlipidemia, osteoporosis, status post fall likely secondary to balance issues with right hip fracture. Would admit the patient and Dr. Martha Clan has already been informed, who would likely do the surgery tomorrow. The patient has no significant shortness of breath or pains in the chest.  She appears to have METS of at least 4. She has had no recent myocardial infarction or congestive heart failure. She has no history of congestive heart failure or myocardial infarction. At this point, would recommend proceeding to the Operating Room presuming  once the INR is acceptable and minimal bleeding risk is there. I have already discussed the case with Dr. Martha Clan. Would go ahead and give vitamin K 10 mg x 1 and recheck PT/INR tomorrow morning. She would be a mild to moderate risk patient given her history of strokes, age and atrial fibrillation. We would continue the statin, resume the beta blocker. She appears to be sinus currently. Pain and deep vein thrombosis prophylaxis per orthopedic. Currently, she is on Coumadin, INR is 1.9. We will hold her Coumadin at this point and she took that last yesterday. Would continue her statin and beta blocker for her atrial fibrillation, she is rate-controlled. She would benefit from physical therapy. Could resume her bowel regimen, make her n.p.o. past midnight. Further recommendations based on what the INR looks like tomorrow per the hospitalist service. The patient is a full code.   TOTAL TIME SPENT: Is 60 minutes. The case was discussed with the patient, her husband and Dr. Martha Clan, the ER physician.       ____________________________ Krystal Eaton, MD sa:cs D: 01/18/2013 15:16:00 ET T: 01/18/2013 15:36:18 ET JOB#: [id number]  cc: Krystal Eaton, MD, <Dictator> Krystal Eaton MD ELECTRONICALLY SIGNED 02/08/2013 12:25

## 2014-10-08 NOTE — Op Note (Signed)
PATIENT NAME:  Alicia Moran, Alicia Moran MR#:  [id number] DATE OF BIRTH:  [birth year]  DATE OF PROCEDURE:  01/19/2013  PREOPERATIVE DIAGNOSIS:  Right comminuted intertrochanteric hip fracture with subtrochanteric extension.    POSTOPERATIVE DIAGNOSIS:  Right comminuted intertrochanteric hip fracture with subtrochanteric extension.   OPERATION:  Intramedullary rod fixation for right comminuted intertrochanteric hip fracture.   ANESTHESIA:  Spinal.  SURGEON:  Juanell Fairly, M.D.   ASSISTANT:  Cherlyn Labella, [employer] student.   ESTIMATED BLOOD LOSS:  200 mL.   COMPLICATIONS:  None.   IMPLANTS:  Biomet Affixus nail 11 x 380 mm with 100 mm lag screw and 44 mm distal interlocking screw.   INDICATIONS FOR PROCEDURE:  The patient is an 81 year old female who sustained a fall in her kitchen at home.  She was diagnosed with a comminuted intratrochanteric hip fracture with subtrochanteric extension on x-rays in the Emergency Department.  The patient was cleared for surgery by the medical staff.  She was on Coumadin for chronic atrial fibrillation and a history of stroke.  She was therefore given vitamin K on arrival.  Her INR on admission was 1.9 and decreased to 1.70 with the vitamin K.  She was then ordered for 2 units of FFP prior to surgery today.  Her INR just prior to surgery was 1.3.  I had reviewed the risks and benefits of surgery with the patient and her husband.  They understand the risks include infection, bleeding requiring blood transfusion, nerve or blood vessel injury, nonunion, malunion, leg length discrepancy, change in lower extremity rotation, persistent hip pain, hardware failure or screw cut out, painful hardware and the need for further surgery.  They also understand the medical risks include, but are not limited to, DVT and pulmonary embolism, myocardial infarction, stroke, pneumonia, respiratory failure and death.  The patient understood these risks and wished to proceed.   PROCEDURE  NOTE:  The patient was brought to the Operating Room.  She underwent a spinal anesthetic by the anesthesia service.  She was then placed supine on the fracture table.  Her right leg was placed in a leg holder and her left leg was placed in a hemi-lithotomy position.  All bony prominences were adequately padded.  A padded central post was also used to provide countertraction.  The patient then had her right leg placed in traction and internally rotated.  C-arm images in AP and lateral position were performed to ensure the fracture was well-reduced prior to prepping and draping.   A timeout was performed to verify the patient's name, date of birth, medical record number, correct site of surgery and correct procedure to be performed.  It was also used to confirm the patient had received antibiotics and all appropriate instruments, implants and radiographic studies were available in the room.  Once all in attendance were in agreement, the case began.  The patient had been prepped and draped in a sterile fashion.   The proposed incisions were drawn out with a surgical marker based upon C-arm images.  A lateral incision based over the fracture site was made of approximately 3 to 4 cm in length.  A deep #10 blade was used to incise the fascia lata.  The vastus lateralis muscle was split in line with its fibers.  The fracture could be manually palpated.  A single fracture clamp was placed around the fracture to help anatomic reduction.   A second incision was made just proximal to the tip of the greater trochanter.  Again,  the deep fascia was incised with a #10 blade.  The hip abductor muscles were split in line with their fibers to allow for palpation of the tip of the greater trochanter.  A guide pin was then inserted into the tip of the greater trochanter and advanced to below the level of the lesser trochanter.  The guide pins position was confirmed on AP and lateral C-arm images.  A starting awl was then placed  over this guidewire and an entry hole into the tip of the greater trochanter was created.  The guidewire was then removed keeping the awl in place.  A ball-tip guidewire was then placed through the cannulated awl and advanced down the shaft of the femur.  Its position, both at the hip and the knee were confirmed on AP and lateral images.  Flexible reamers were then placed over this ball-tip guidewire to expand the medullary canal to 13 mm.  A depth gauge was then placed along the ball-tip guidewire at the tip of the greater trochanter to accurately measure the length of the required rod for fixation.  It was measured at 380 mm.  A Biomet Affixus 11 x 380 mm IM rod was then advanced into position over the ball-tip guidewire.  The ball-tip guidewire was then removed.   Through the guide arm of the intramedullary nail the drill sleeve for the lag screw was placed.  A guide pin was then advanced through the lateral cortex of the femur, across the fracture site and into the femoral head.  Its position was confirmed on AP and lateral C-arm images.  Tape apex distance of less than 25 mm was achieved.  Once the guide pin was then in the desired position it was overdrilled with the lag screw drill to a depth of 100 mm.  The length of a lag screw had been measured using a depth gauge and 100 mm was found to be the ideal length.  Following the lag screw drilling the actual 100 mm lag screw was advanced across the fracture site manually.  Its final position was confirmed again on AP and lateral C-arm images.  The set screw was then tightened from the superior aspect of the intramedullary rod.  It was then turned counterclockwise by a quarter turn to allow the lag screw motion within the nail to allow for compression of the fracture site.  Attention was then turned to the distal femur.   A perfect circle technique was used to place a single distal lag screw.  The dynamic screw hole was used.  A #10 blade was used to make a  stab incision over the lateral leg over the dynamic screw hole.  The deep fascia was pierced with a deep #10 blade.  A tonsil clamp was used to separate the vastus lateralis muscle fibers.  A distal interlocking drill bit was used using a perfect circle technique.  The drill was performed freehand.  Once the drill was advanced bicortically, it was removed.  A depth gauge measured the depth of the interlocking screw desired.  It was measured to be 44 mm in length.  A 44 mm distal lag screw was then advanced across the distal femur through the dynamics screw hole.  This screw was placed through both cortices.  Its final position again was confirmed on AP and lateral C-arm images.  Final images of the distal femur fracture site and hip joint were taken with the C-arm.  All wounds were then copiously irrigated.  The  deep fascia was closed with a 0 Vicryl and the subcutaneous tissues closed with 2-0 Vicryl.  The skin was approximated with staples.  A dry sterile dressing was applied.  The patient was then transferred to a hospital bed and brought to the PACU in stable condition.  I was scrubbed and present for the entire case.  All sharp and instrument counts were correct at the conclusion of the case.  I spoke with the patient's husband and family friend in the consultation room to let them know the case had gone without complication.  The patient was stable in the recovery room.     ____________________________ Kathreen Devoid, MD klk:ea D: 01/21/2013 00:09:19 ET T: 01/21/2013 00:43:54 ET JOB#: [id number]  cc: Kathreen Devoid, MD, <Dictator> Kathreen Devoid MD ELECTRONICALLY SIGNED 01/29/2013 16:57

## 2014-10-08 NOTE — Consult Note (Signed)
PATIENT NAME:  Alicia Moran, Alicia Moran MR#:  [id number] DATE OF BIRTH:  11/17/1928  DATE OF CONSULTATION:  01/18/2013  REFERRING PHYSICIAN:   CONSULTING PHYSICIAN:  Kathreen Devoid, MD  REASON FOR CONSULTATION: Right comminuted intratrochanteric hip fracture.   HISTORY OF PRESENT ILLNESS: Ms. Alicia Moran is an 81 year old female with a history of strokes x 2, atrial fibrillation and hyperlipidemia. The patient sustained a mechanical fall at home. She does not recall the exact mechanism, but fell backwards on to the tile floor in her kitchen. She denies any syncope and did not hit her head or lose consciousness. The patient denies any other injuries to her extremities. The patient does state that she has been left with  right-sided weakness after her stroke and has right foot drop. She also states that she has some neuropathy in both lower extremities.   The patient's past medical history, past surgical history, family and social history as well as medication allergies were reviewed from the patient's admission history and physical as well as from the electronic medical record. I personally reviewed these documents.   PHYSICAL EXAMINATION: GENERAL APPEARANCE: The patient is alert, oriented, in no acute distress. Her husband and a family friend are at the bedside during this examination. The patient's skin is intact throughout the right hip and lower extremity. Her thigh and leg compartments are soft and compressible. She has shortening and external rotation deformity of the right lower extremity. She can flex and she has intact sensation to light touch, but this is slightly diminished on the right side compared to the left. Both lower extremities, she states, have slightly diminished sensation to light touch. The patient has a foot drop on the right foot. Her leg compartments are soft and compressible.  She has palpable pedal pulses.   The patient right lower extremity is Buck's traction.   RADIOLOGY: I  reviewed the patient's plain films of the right hip and pelvis along with a CT of the pelvis. These radiographic studies demonstrate a comminuted fracture of the proximal femur involving the intertrochanteric region of the hip extending into the subtrochanteric. The femoral head is concentric and well covered by acetabulum. The hip is located.   ASSESSMENT: Comminuted, displaced intertrochanteric right hip fracture.   PLAN: I reviewed with Ms. Alicia Moran and her husband the diagnosis. I have recommended placement of a long intramedullary rod for fixation of her right intertrochanteric hip fracture. I discussed the procedure in detail along with the postoperative course. Her INR today was 1.9 on admission and therefore surgery could not be performed today. She has been given 10 mg of vitamin K intramuscular in an effort reduce her INR.  It will be rechecked in the morning. The patient is tentatively scheduled for surgery tomorrow. She will be nothing oral after midnight. I had signed the right hip with a marker, according to the hospital's right site protocol. I reviewed the risks and benefits of the surgery with the patient and her husband. They understand the risks include infection, bleeding requiring blood transfusion, nerve or blood vessel injury, malunion, nonunion, leg length discrepancy, change in lower extremity rotation, persistent right hip pain, failure to return to ambulation, painful hardware, failure of the hardware and the need for further surgery including conversion to a total hip arthroplasty. Medical complications include, but are not limited to, deep vein thrombosis and pulmonary embolism, myocardial infarction, stroke, pneumonia, respiratory failure and death. Dr. Jacques Navy, the hospitalist, has seen the patient and will clear her for surgery. I  will re-evaluate the patient and her labs in the morning to determine its surgery can be performed then. The patient and her husband understood and  agreed with this plan.    ____________________________ Kathreen Devoid, MD klk:cc D: 01/18/2013 17:35:50 ET T: 01/18/2013 20:25:28 ET JOB#: [id number]  cc: Kathreen Devoid, MD, <Dictator> Kathreen Devoid MD ELECTRONICALLY SIGNED 01/29/2013 16:57

## 2014-10-25 ENCOUNTER — Telehealth: Payer: Self-pay | Admitting: *Deleted

## 2014-10-25 NOTE — Telephone Encounter (Signed)
Pt called states she is having a tooth abstracted on Wed. 5.11.16.  She is wanting to know when she is to stop her Aspirin regiment and also due to her history of C Diff what type of antibiotic can she take after the abstraction.  Please advise

## 2014-10-25 NOTE — Telephone Encounter (Signed)
Spoke with pt, advised of MDs message.  Pt verbalized understanding 

## 2014-10-25 NOTE — Telephone Encounter (Signed)
SHE CAN STOP THE ASPIRIN NOW.  She does not need an antibiotic  for a simple tooth extraction

## 2014-10-26 ENCOUNTER — Other Ambulatory Visit: Payer: Self-pay | Admitting: Internal Medicine

## 2014-11-12 ENCOUNTER — Other Ambulatory Visit (INDEPENDENT_AMBULATORY_CARE_PROVIDER_SITE_OTHER): Payer: Medicare Other

## 2014-11-12 DIAGNOSIS — R195 Other fecal abnormalities: Secondary | ICD-10-CM

## 2014-11-12 LAB — FECAL OCCULT BLOOD, IMMUNOCHEMICAL: Fecal Occult Bld: NEGATIVE

## 2014-11-16 ENCOUNTER — Encounter: Payer: Self-pay | Admitting: *Deleted

## 2015-01-17 ENCOUNTER — Other Ambulatory Visit: Payer: Self-pay | Admitting: Internal Medicine

## 2015-01-17 DIAGNOSIS — E559 Vitamin D deficiency, unspecified: Secondary | ICD-10-CM

## 2015-01-17 DIAGNOSIS — R5383 Other fatigue: Secondary | ICD-10-CM

## 2015-01-17 DIAGNOSIS — D500 Iron deficiency anemia secondary to blood loss (chronic): Secondary | ICD-10-CM

## 2015-01-17 DIAGNOSIS — E785 Hyperlipidemia, unspecified: Secondary | ICD-10-CM

## 2015-01-17 DIAGNOSIS — I70209 Unspecified atherosclerosis of native arteries of extremities, unspecified extremity: Secondary | ICD-10-CM

## 2015-01-17 NOTE — Telephone Encounter (Signed)
The simvastatin was refilled in 10/26/14 and the metoprolol succinate (toprol-XL) was refilled 07/30/14. She has no future appt would you like me to refill?

## 2015-01-18 ENCOUNTER — Telehealth: Payer: Self-pay

## 2015-01-18 ENCOUNTER — Other Ambulatory Visit: Payer: Self-pay

## 2015-01-18 MED ORDER — METOPROLOL SUCCINATE ER 25 MG PO TB24: 25.0000 mg | ORAL_TABLET | Freq: Every day | ORAL | Status: DC

## 2015-01-18 MED ORDER — SIMVASTATIN 10 MG PO TABS: 10.0000 mg | ORAL_TABLET | Freq: Every day | ORAL | Status: DC

## 2015-01-18 NOTE — Telephone Encounter (Signed)
Refill for 30 days  On the simvastatin an d90 on the other,  She needs nonfasting labs and six month follow up prior to any more refills

## 2015-01-18 NOTE — Telephone Encounter (Signed)
Pt was scheduled for her appt for both labs and follow up appt. F/U august 29 and nonfasting labs 8/25.

## 2015-02-10 ENCOUNTER — Other Ambulatory Visit: Payer: Medicare Other

## 2015-02-14 ENCOUNTER — Ambulatory Visit: Payer: Medicare Other | Admitting: Internal Medicine

## 2015-02-23 ENCOUNTER — Other Ambulatory Visit (INDEPENDENT_AMBULATORY_CARE_PROVIDER_SITE_OTHER): Payer: Medicare Other

## 2015-02-23 DIAGNOSIS — D500 Iron deficiency anemia secondary to blood loss (chronic): Secondary | ICD-10-CM | POA: Diagnosis not present

## 2015-02-23 DIAGNOSIS — Z7901 Long term (current) use of anticoagulants: Secondary | ICD-10-CM | POA: Diagnosis not present

## 2015-02-23 DIAGNOSIS — E559 Vitamin D deficiency, unspecified: Secondary | ICD-10-CM | POA: Diagnosis not present

## 2015-02-23 DIAGNOSIS — E785 Hyperlipidemia, unspecified: Secondary | ICD-10-CM | POA: Diagnosis not present

## 2015-02-23 DIAGNOSIS — R5383 Other fatigue: Secondary | ICD-10-CM

## 2015-02-23 LAB — COMPREHENSIVE METABOLIC PANEL
ALBUMIN: 4.2 g/dL (ref 3.5–5.2)
ALT: 10 U/L (ref 0–35)
AST: 17 U/L (ref 0–37)
Alkaline Phosphatase: 79 U/L (ref 39–117)
BUN: 28 mg/dL — ABNORMAL HIGH (ref 6–23)
CALCIUM: 9.6 mg/dL (ref 8.4–10.5)
CHLORIDE: 103 meq/L (ref 96–112)
CO2: 26 mEq/L (ref 19–32)
Creatinine, Ser: 0.81 mg/dL (ref 0.40–1.20)
GFR: 71.59 mL/min (ref >60.00)
Glucose, Bld: 98 mg/dL (ref 70–99)
POTASSIUM: 4.3 meq/L (ref 3.5–5.1)
SODIUM: 138 meq/L (ref 135–145)
Total Bilirubin: 0.5 mg/dL (ref 0.2–1.2)
Total Protein: 7.1 g/dL (ref 6.0–8.3)

## 2015-02-23 LAB — LIPID PANEL
CHOLESTEROL: 122 mg/dL (ref 0–200)
HDL: 59.5 mg/dL (ref >39.00)
LDL CALC: 51 mg/dL (ref 0–99)
NonHDL: 62.02
TRIGLYCERIDES: 53 mg/dL (ref 0.0–149.0)
Total CHOL/HDL Ratio: 2
VLDL: 10.6 mg/dL (ref 0.0–40.0)

## 2015-02-23 LAB — TSH: TSH: 3.3 u[IU]/mL (ref 0.35–4.50)

## 2015-02-23 LAB — CBC WITH DIFFERENTIAL/PLATELET
Basophils Absolute: 0 10*3/uL (ref 0.0–0.1)
Basophils Relative: 0.7 % (ref 0.0–3.0)
EOS PCT: 0.9 % (ref 0.0–5.0)
Eosinophils Absolute: 0 10*3/uL (ref 0.0–0.7)
HCT: 32 % — ABNORMAL LOW (ref 36.0–46.0)
Hemoglobin: 10.8 g/dL — ABNORMAL LOW (ref 12.0–15.0)
LYMPHS ABS: 2.5 10*3/uL (ref 0.7–4.0)
Lymphocytes Relative: 46.9 % — ABNORMAL HIGH (ref 12.0–46.0)
MCHC: 33.6 g/dL (ref 30.0–36.0)
MCV: 97.9 fl (ref 78.0–100.0)
MONOS PCT: 5.4 % (ref 3.0–12.0)
Monocytes Absolute: 0.3 10*3/uL (ref 0.1–1.0)
NEUTROS ABS: 2.5 10*3/uL (ref 1.4–7.7)
NEUTROS PCT: 46.1 % (ref 43.0–77.0)
PLATELETS: 116 10*3/uL — AB (ref 150.0–400.0)
RBC: 3.27 Mil/uL — ABNORMAL LOW (ref 3.87–5.11)
RDW: 13.8 % (ref 11.5–15.5)
WBC: 5.3 10*3/uL (ref 4.0–10.5)

## 2015-02-23 LAB — PROTIME-INR
INR: 1 ratio (ref 0.8–1.0)
Prothrombin Time: 11.5 s (ref 9.6–13.1)

## 2015-02-23 LAB — VITAMIN D 25 HYDROXY (VIT D DEFICIENCY, FRACTURES): VITD: 38.88 ng/mL (ref 30.00–100.00)

## 2015-02-28 ENCOUNTER — Encounter: Payer: Self-pay | Admitting: Internal Medicine

## 2015-02-28 ENCOUNTER — Encounter: Payer: Self-pay | Admitting: *Deleted

## 2015-02-28 ENCOUNTER — Ambulatory Visit (INDEPENDENT_AMBULATORY_CARE_PROVIDER_SITE_OTHER): Payer: Medicare Other | Admitting: Internal Medicine

## 2015-02-28 VITALS — BP 140/88 | HR 84 | Temp 97.6°F | Resp 14 | Ht 65.0 in | Wt 137.2 lb

## 2015-02-28 DIAGNOSIS — G629 Polyneuropathy, unspecified: Secondary | ICD-10-CM | POA: Diagnosis not present

## 2015-02-28 DIAGNOSIS — G319 Degenerative disease of nervous system, unspecified: Secondary | ICD-10-CM | POA: Diagnosis not present

## 2015-02-28 DIAGNOSIS — Z7901 Long term (current) use of anticoagulants: Secondary | ICD-10-CM | POA: Diagnosis not present

## 2015-02-28 DIAGNOSIS — I70209 Unspecified atherosclerosis of native arteries of extremities, unspecified extremity: Secondary | ICD-10-CM

## 2015-02-28 DIAGNOSIS — D500 Iron deficiency anemia secondary to blood loss (chronic): Secondary | ICD-10-CM

## 2015-02-28 NOTE — Patient Instructions (Addendum)
You do NOT have dementia!  Your "neuropathy" does not need treatment unless it is due to a deficiency,  I am checking your B12 anf folate levels to day to rule out deficiencies  You can suspend your aspirin before your surgies for 5 to 7 days  Your toenails are bruising due to the big toe hitting the top of your shoe.   Please decide if you want a mammogram   You can use the prednisone taper for arthritis,  Not for neuropathy

## 2015-02-28 NOTE — Progress Notes (Signed)
Subjective:  Patient ID: Alicia Moran, female    DOB: 11/21/1928  Age: 81 y.o. MRN: [id number]  CC: The primary encounter diagnosis was Neuropathy. Diagnoses of Mild cerebral atrophy, Anemia due to chronic blood loss, Long term current use of anticoagulant therapy, and Atherosclerotic peripheral vascular disease were also pertinent to this visit.    HPI Alicia Moran presents for follow up on multiple acute and chronic issues, including  hyperlipidemia, atrial fibrillation with prior CVA,  And frequent falls   Saw Dr Sherryll Burger recently.  She was evaluated with a CT head and was offended and alarmed b his comments of changes in her brain that could lead to dementia, .  She is concerned now that she will decline and not be able to care for her troubled adult daughter.   MAMMOGRAM DISCUSSED,  HAS NOT HAD ONe IN 2 YEARS,  WANTS to  CONSIDER RESUMING IT BUT NOT SURE  THINKS SHE HAS A PERIPHERAL NEUROPATHY AFFECTING HANDS AND FEET.  Has concurren tOA and has not started the  PREDNISONE TAPER 4 MG TABLET OrDERED BY Juanell Fairly.  HAD MYRINGOTOMY TUBE PLACED IN RIGHT EAR  April 2015; DUE TO PERSISTENT OTITIS MEDIA AND NEED TO AVOID ABX DUE TO HISTORY OF C DIFICILE,.  DR Andee Poles.  HAS HAD PARTIAL HEARING LOSS ON RIGHT  REPEAT TESTING TOMORROW . Very upset wants to know if surgery is needed will I refer her to [facility].  Worried about losing her independence due to glaucoma,  Husband losing vision as well.  Requesting podiatry referral for toenail that continues to "grow out bloody"since her fall      NO FALLS IN THE LAST 6 MONTH CAROTID DOPPLERS DONE BY DEW   Outpatient Prescriptions Prior to Visit  Medication Sig Dispense Refill  . Cholecalciferol (VITAMIN D3) 3000 UNITS TABS Take 2,000 Units by mouth daily.    . Coenzyme Q10 (COQ10) 50 MG CAPS Take 1 capsule by mouth daily.    . diazepam (VALIUM) 5 MG tablet Take 1 tablet (5 mg total) by mouth every 12 (twelve) hours as needed for anxiety.  30 tablet 1  . famotidine (PEPCID) 10 MG tablet Take 10 mg by mouth as needed for heartburn or indigestion.    . Lactobacillus-Inulin (CULTURELLE DIGESTIVE HEALTH) CAPS Take 1 capsule by mouth daily. 30 capsule 0  . metoprolol succinate (TOPROL-XL) 25 MG 24 hr tablet Take 1 tablet (25 mg total) by mouth daily. 90 tablet 0  . simvastatin (ZOCOR) 10 MG tablet Take 1 tablet (10 mg total) by mouth at bedtime. 30 tablet 0  . oxyCODONE-acetaminophen (PERCOCET/ROXICET) 5-325 MG per tablet Take 1 tablet by mouth every 4 (four) hours as needed for severe pain. (Patient not taking: Reported on 02/28/2015) 90 tablet 0  . ALPRAZolam (XANAX) 0.25 MG tablet TAKE ONE TABLET TWICE A DAY IF NEEDED FOR ANXIETY (Patient not taking: Reported on 02/28/2015) 60 tablet 3   No facility-administered medications prior to visit.    Review of Systems;  Patient denies headache, fevers, malaise, unintentional weight loss, skin rash, eye pain, sinus congestion and sinus pain, sore throat, dysphagia,  hemoptysis , cough, dyspnea, wheezing, chest pain, palpitations, orthopnea, edema, abdominal pain, nausea, melena, diarrhea, constipation, flank pain, dysuria, hematuria, urinary  Frequency, nocturia, numbness, tingling, seizures,  Focal weakness, Loss of consciousness,  Tremor, insomnia, depression, anxiety, and suicidal ideation.      Objective:  BP 140/88 mmHg  Pulse 84  Temp(Src) 97.6 F (36.4 C) (Oral)  Resp  14  Ht 5\' 5"  (1.651 m)  Wt 137 lb 4 oz (62.256 kg)  BMI 22.84 kg/m2  SpO2 96%  BP Readings from Last 3 Encounters:  02/28/15 140/88  07/14/14 130/72  10/28/13 138/74    Wt Readings from Last 3 Encounters:  02/28/15 137 lb 4 oz (62.256 kg)  07/14/14 136 lb 4 oz (61.803 kg)  10/28/13 143 lb (64.864 kg)    General appearance: alert, cooperative and appears stated age Ears: normal TM's and external ear canals both ears Throat: lips, mucosa, and tongue normal; teeth and gums normal Neck: no adenopathy, no  carotid bruit, supple, symmetrical, trachea midline and thyroid not enlarged, symmetric, no tenderness/mass/nodules Back: symmetric, no curvature. ROM normal. No CVA tenderness. Lungs: clear to auscultation bilaterally Heart: regular rate and rhythm, S1, S2 normal, no murmur, click, rub or gallop Abdomen: soft, non-tender; bowel sounds normal; no masses,  no organomegaly Pulses: 2+ and symmetric Skin: Skin color, texture, turgor normal. No rashes or lesions Lymph nodes: Cervical, supraclavicular, and axillary nodes normal.  Lab Results  Component Value Date   HGBA1C 5.4 12/24/2011    Lab Results  Component Value Date   CREATININE 0.81 02/23/2015   CREATININE 0.79 07/16/2014   CREATININE 1.09 06/10/2014    Lab Results  Component Value Date   WBC 5.3 02/23/2015   HGB 10.8* 02/23/2015   HCT 32.0* 02/23/2015   PLT 116.0* 02/23/2015   GLUCOSE 98 02/23/2015   CHOL 122 02/23/2015   TRIG 53.0 02/23/2015   HDL 59.50 02/23/2015   LDLCALC 51 02/23/2015   ALT 10 02/23/2015   AST 17 02/23/2015   NA 138 02/23/2015   K 4.3 02/23/2015   CL 103 02/23/2015   CREATININE 0.81 02/23/2015   BUN 28* 02/23/2015   CO2 26 02/23/2015   TSH 3.30 02/23/2015   INR 1.0 02/23/2015   HGBA1C 5.4 12/24/2011    No results found.  Assessment & Plan:   Problem List Items Addressed This Visit      Unprioritized   Long term current use of anticoagulant therapy    Coumadin discontinued due to recurrent falls and SDH      Anemia due to chronic blood loss    Normocytic, stable.       Atherosclerotic peripheral vascular disease    Referral to [facility] for evaluation of carocontintid arteries given persistent sensation of pulsations in right ear and prior nonsignifcant stenoses noted. Advised to continue statin and aspirin       Relevant Medications   aspirin 81 MG tablet   Mild cerebral atrophy    Results of CT head discussed with patient.  She does not have dementia.       Neuropathy -  Primary    Self described ,  Screening for deficiencies done and were normal.  Advised to try the prednisone taper prescribed by Dr Shela Nevin given her concurrent OA which may be the cause of her symptoms.    Lab Results  Component Value Date   VITAMINB12 400 02/28/2015         Relevant Orders   Vitamin B12 (Completed)   Folate RBC (Completed)    A total of 25 minutes of face to face time was spent with patient more than half of which was spent in counselling and coordination of care    I have discontinued Ms. Hogue's ALPRAZolam. I am also having her maintain her Vitamin D3, CULTURELLE DIGESTIVE HEALTH, oxyCODONE-acetaminophen, famotidine, CoQ10, diazepam, simvastatin, metoprolol succinate, and aspirin.  Meds ordered this encounter  Medications  . aspirin 81 MG tablet    Sig: Take 81 mg by mouth daily.    Medications Discontinued During This Encounter  Medication Reason  . ALPRAZolam (XANAX) 0.25 MG tablet Change in therapy    Follow-up: Return in about 6 months (around 08/28/2015).   Sherlene Shams, MD

## 2015-02-28 NOTE — Progress Notes (Signed)
Pre-visit discussion using our clinic review tool. No additional management support is needed unless otherwise documented below in the visit note.  

## 2015-03-01 LAB — VITAMIN B12: Vitamin B-12: 400 pg/mL (ref 211–911)

## 2015-03-01 LAB — FOLATE RBC: RBC Folate: 1387 ng/mL (ref >280)

## 2015-03-02 DIAGNOSIS — G629 Polyneuropathy, unspecified: Secondary | ICD-10-CM | POA: Insufficient documentation

## 2015-03-02 DIAGNOSIS — G319 Degenerative disease of nervous system, unspecified: Secondary | ICD-10-CM | POA: Insufficient documentation

## 2015-03-02 NOTE — Assessment & Plan Note (Signed)
Referral to [facility] for evaluation of carocontintid arteries given persistent sensation of pulsations in right ear and prior nonsignifcant stenoses noted. Advised to continue statin and aspirin

## 2015-03-02 NOTE — Assessment & Plan Note (Signed)
Self described ,  Screening for deficiencies done and were normal.  Advised to try the prednisone taper prescribed by Dr Donney Rankins given her concurrent OA which may be the cause of her symptoms.    Lab Results  Component Value Date   VITAMINB12 400 02/28/2015

## 2015-03-02 NOTE — Assessment & Plan Note (Signed)
Results of CT head discussed with patient.  She does not have dementia.

## 2015-03-02 NOTE — Assessment & Plan Note (Addendum)
Normocytic, stable.

## 2015-03-02 NOTE — Assessment & Plan Note (Signed)
Coumadin discontinued due to recurrent falls and SDH

## 2015-03-16 ENCOUNTER — Telehealth: Payer: Self-pay | Admitting: Internal Medicine

## 2015-03-16 DIAGNOSIS — H65491 Other chronic nonsuppurative otitis media, right ear: Secondary | ICD-10-CM

## 2015-03-16 NOTE — Telephone Encounter (Signed)
Referral is in process as requested 

## 2015-03-17 ENCOUNTER — Other Ambulatory Visit: Payer: Medicare Other

## 2015-03-17 ENCOUNTER — Other Ambulatory Visit: Payer: Self-pay | Admitting: Internal Medicine

## 2015-03-28 DIAGNOSIS — H7291 Unspecified perforation of tympanic membrane, right ear: Secondary | ICD-10-CM | POA: Insufficient documentation

## 2015-03-30 ENCOUNTER — Telehealth: Payer: Self-pay | Admitting: Internal Medicine

## 2015-03-30 NOTE — Telephone Encounter (Signed)
Patient saw ENT on 03/29/15 patient was prescribed ABX ear drops and wanted to know should she start due to HX of C-diff.  Verbal received form MD no need to worry abx drops will not cause C-diff.

## 2015-03-30 NOTE — Telephone Encounter (Signed)
Patient notified

## 2015-04-15 ENCOUNTER — Telehealth: Payer: Self-pay | Admitting: Internal Medicine

## 2015-04-15 DIAGNOSIS — R5383 Other fatigue: Secondary | ICD-10-CM

## 2015-04-15 DIAGNOSIS — E785 Hyperlipidemia, unspecified: Secondary | ICD-10-CM

## 2015-04-15 DIAGNOSIS — E559 Vitamin D deficiency, unspecified: Secondary | ICD-10-CM

## 2015-04-15 DIAGNOSIS — Z113 Encounter for screening for infections with a predominantly sexual mode of transmission: Secondary | ICD-10-CM

## 2015-04-15 NOTE — Telephone Encounter (Signed)
Please advise 

## 2015-04-15 NOTE — Telephone Encounter (Signed)
Pt would like to have lab work done before her appt on 09/02/2015. Orders needed please and thank You!

## 2015-04-16 NOTE — Telephone Encounter (Signed)
Fasting and screening labs ordered, due around March 1

## 2015-04-18 NOTE — Telephone Encounter (Signed)
Can call and schedule around march 1st.  thanks

## 2015-04-28 ENCOUNTER — Other Ambulatory Visit: Payer: Self-pay

## 2015-04-28 MED ORDER — SIMVASTATIN 10 MG PO TABS: 10.0000 mg | ORAL_TABLET | Freq: Every day | ORAL | Status: DC

## 2015-06-07 ENCOUNTER — Other Ambulatory Visit: Payer: Self-pay | Admitting: Internal Medicine

## 2015-08-24 ENCOUNTER — Other Ambulatory Visit: Payer: Medicare Other

## 2015-08-26 ENCOUNTER — Other Ambulatory Visit (INDEPENDENT_AMBULATORY_CARE_PROVIDER_SITE_OTHER): Payer: Medicare Other

## 2015-08-26 ENCOUNTER — Telehealth: Payer: Self-pay | Admitting: Internal Medicine

## 2015-08-26 DIAGNOSIS — R5383 Other fatigue: Secondary | ICD-10-CM | POA: Diagnosis not present

## 2015-08-26 DIAGNOSIS — Z113 Encounter for screening for infections with a predominantly sexual mode of transmission: Secondary | ICD-10-CM

## 2015-08-26 DIAGNOSIS — E785 Hyperlipidemia, unspecified: Secondary | ICD-10-CM

## 2015-08-26 DIAGNOSIS — E559 Vitamin D deficiency, unspecified: Secondary | ICD-10-CM | POA: Diagnosis not present

## 2015-08-26 LAB — CBC WITH DIFFERENTIAL/PLATELET
BASOS ABS: 0 10*3/uL (ref 0.0–0.1)
BASOS PCT: 0.7 % (ref 0.0–3.0)
EOS ABS: 0 10*3/uL (ref 0.0–0.7)
Eosinophils Relative: 0.5 % (ref 0.0–5.0)
HEMATOCRIT: 30.4 % — AB (ref 36.0–46.0)
Hemoglobin: 10.4 g/dL — ABNORMAL LOW (ref 12.0–15.0)
LYMPHS ABS: 2.3 10*3/uL (ref 0.7–4.0)
Lymphocytes Relative: 49.8 % — ABNORMAL HIGH (ref 12.0–46.0)
MCHC: 34.2 g/dL (ref 30.0–36.0)
MCV: 96.4 fl (ref 78.0–100.0)
MONO ABS: 0.3 10*3/uL (ref 0.1–1.0)
Monocytes Relative: 5.6 % (ref 3.0–12.0)
NEUTROS ABS: 2 10*3/uL (ref 1.4–7.7)
NEUTROS PCT: 43.4 % (ref 43.0–77.0)
PLATELETS: 120 10*3/uL — AB (ref 150.0–400.0)
RBC: 3.16 Mil/uL — ABNORMAL LOW (ref 3.87–5.11)
RDW: 14 % (ref 11.5–15.5)
WBC: 4.7 10*3/uL (ref 4.0–10.5)

## 2015-08-26 LAB — COMPREHENSIVE METABOLIC PANEL
ALK PHOS: 68 U/L (ref 39–117)
ALT: 9 U/L (ref 0–35)
AST: 14 U/L (ref 0–37)
Albumin: 4.2 g/dL (ref 3.5–5.2)
BUN: 18 mg/dL (ref 6–23)
CO2: 28 meq/L (ref 19–32)
Calcium: 9.5 mg/dL (ref 8.4–10.5)
Chloride: 103 mEq/L (ref 96–112)
Creatinine, Ser: 0.8 mg/dL (ref 0.40–1.20)
GFR: 72.53 mL/min (ref >60.00)
GLUCOSE: 94 mg/dL (ref 70–99)
POTASSIUM: 4.1 meq/L (ref 3.5–5.1)
SODIUM: 139 meq/L (ref 135–145)
TOTAL PROTEIN: 6.7 g/dL (ref 6.0–8.3)
Total Bilirubin: 0.7 mg/dL (ref 0.2–1.2)

## 2015-08-26 LAB — LIPID PANEL
CHOL/HDL RATIO: 2
Cholesterol: 121 mg/dL (ref 0–200)
HDL: 52 mg/dL (ref >39.00)
LDL CALC: 60 mg/dL (ref 0–99)
NONHDL: 69.11
TRIGLYCERIDES: 48 mg/dL (ref 0.0–149.0)
VLDL: 9.6 mg/dL (ref 0.0–40.0)

## 2015-08-26 LAB — VITAMIN D 25 HYDROXY (VIT D DEFICIENCY, FRACTURES): VITD: 34.87 ng/mL (ref 30.00–100.00)

## 2015-08-26 LAB — TSH: TSH: 2.77 u[IU]/mL (ref 0.35–4.50)

## 2015-08-26 NOTE — Telephone Encounter (Signed)
Pt dropped off a application to renew handicap sign. Form is in Dr. Lupita Dawn box.  Thanks

## 2015-08-27 LAB — HEPATITIS C ANTIBODY: HCV AB: NEGATIVE

## 2015-08-27 LAB — HIV ANTIBODY (ROUTINE TESTING W REFLEX): HIV 1&2 Ab, 4th Generation: NONREACTIVE

## 2015-09-02 ENCOUNTER — Ambulatory Visit (INDEPENDENT_AMBULATORY_CARE_PROVIDER_SITE_OTHER): Payer: Medicare Other | Admitting: Internal Medicine

## 2015-09-02 ENCOUNTER — Encounter: Payer: Self-pay | Admitting: Internal Medicine

## 2015-09-02 VITALS — BP 140/70 | HR 72 | Temp 97.7°F | Resp 12 | Ht 65.0 in | Wt 134.8 lb

## 2015-09-02 DIAGNOSIS — D509 Iron deficiency anemia, unspecified: Secondary | ICD-10-CM | POA: Diagnosis not present

## 2015-09-02 DIAGNOSIS — D6489 Other specified anemias: Secondary | ICD-10-CM

## 2015-09-02 DIAGNOSIS — F4323 Adjustment disorder with mixed anxiety and depressed mood: Secondary | ICD-10-CM

## 2015-09-02 DIAGNOSIS — K5909 Other constipation: Secondary | ICD-10-CM

## 2015-09-02 DIAGNOSIS — E785 Hyperlipidemia, unspecified: Secondary | ICD-10-CM

## 2015-09-02 DIAGNOSIS — D500 Iron deficiency anemia secondary to blood loss (chronic): Secondary | ICD-10-CM | POA: Diagnosis not present

## 2015-09-02 DIAGNOSIS — D638 Anemia in other chronic diseases classified elsewhere: Secondary | ICD-10-CM

## 2015-09-02 LAB — FERRITIN: Ferritin: 115.8 ng/mL (ref 10.0–291.0)

## 2015-09-02 MED ORDER — MIRTAZAPINE 15 MG PO TABS: 15.0000 mg | ORAL_TABLET | Freq: Every day | ORAL | Status: DC

## 2015-09-02 MED ORDER — LACTULOSE 20 GM/30ML PO SOLN: ORAL | Status: DC

## 2015-09-02 MED ORDER — POLYETHYLENE GLYCOL 3350 17 GM/SCOOP PO POWD: ORAL | Status: DC

## 2015-09-02 NOTE — Patient Instructions (Addendum)
You can use miralax and a stool softener daily for your constipation   You can use Lactulose once a week if needed.   Try not to sued the Dulcolax more than every 3 days   i recommend taking remeron (mirtazapine) daily at bedtime to help your mood , insomnia and energy level  Start with 1/2 tablet daily for the first few days.  Increased to full tablet daily after 4 days

## 2015-09-02 NOTE — Progress Notes (Signed)
Subjective:  Patient ID: Alicia Moran, female    DOB: 25-May-1929  Age: 82 y.o. MRN: [id number]  CC: The primary encounter diagnosis was Anemia due to other cause. Diagnoses of Anemia, iron deficiency, Adjustment disorder with mixed anxiety and depressed mood, Anemia due to chronic blood loss, Hyperlipidemia, and Other constipation were also pertinent to this visit.  HPI Alicia Moran presents for 6 month follow up on multiple issues including history of atrial fib and prior subdural hematoma secondary to fall  With hip fracture Dec 2015.  Not taking coumadin due to history of falls.   Neurology follow u p Dec EEG ordered  For reports of AMS .  Patient decided not to  have it done.   Widowed in 2022-07-15,  Husband died suddenly. She found  Him dead in his chair one morning.  No warning.   3 friends all died within 14 months. Daughter is on dialysis, lives with her,  Has frequent mood swings.   Has a cleaning woman who helps her take duaghter to dialysis.  Feels isolated now. Belongs to a church .  Does enjoy watching basketball   Totally exhausted by 4 pm.  Sleeping 5 hours per night in bed by 9 pm .  Awake by 3 Or 4 .  Prior trial of alprazolam made her feel awful .  Tried melatonin didn't help.  "I don't want to get addicted." Tried Lexapro  ,  Felt it raised her blood pressure after one dose so she stopped it.     Right knee bothering her a lot for the past week.  history of right hip fracture.  Wants  to see Sandi Raveling.  Doesn't want a knee replacement.  Outpatient Prescriptions Prior to Visit  Medication Sig Dispense Refill  . aspirin 81 MG tablet Take 81 mg by mouth daily.    . Cholecalciferol (VITAMIN D3) 3000 UNITS TABS Take 2,000 Units by mouth daily.    . Coenzyme Q10 (COQ10) 50 MG CAPS Take 1 capsule by mouth daily.    . famotidine (PEPCID) 10 MG tablet Take 10 mg by mouth as needed for heartburn or indigestion.    . Lactobacillus-Inulin (CULTURELLE DIGESTIVE HEALTH) CAPS  Take 1 capsule by mouth daily. 30 capsule 0  . metoprolol succinate (TOPROL-XL) 25 MG 24 hr tablet Take 1 tablet (25 mg total) by mouth daily. 90 tablet 0  . simvastatin (ZOCOR) 10 MG tablet TAKE 1 TABLET AT BEDTIME 60 tablet 2  . diazepam (VALIUM) 5 MG tablet Take 1 tablet (5 mg total) by mouth every 12 (twelve) hours as needed for anxiety. (Patient not taking: Reported on 09/02/2015) 30 tablet 1  . oxyCODONE-acetaminophen (PERCOCET/ROXICET) 5-325 MG per tablet Take 1 tablet by mouth every 4 (four) hours as needed for severe pain. (Patient not taking: Reported on 02/28/2015) 90 tablet 0   No facility-administered medications prior to visit.    Review of Systems;  Patient denies headache, fevers, malaise, unintentional weight loss, skin rash, eye pain, sinus congestion and sinus pain, sore throat, dysphagia,  hemoptysis , cough, dyspnea, wheezing, chest pain, palpitations, orthopnea, edema, abdominal pain, nausea, melena, diarrhea, constipation, flank pain, dysuria, hematuria, urinary  Frequency, nocturia, numbness, tingling, seizures,  Focal weakness, Loss of consciousness,  Tremor, insomnia, depression, anxiety, and suicidal ideation.      Objective:  BP 140/70 mmHg  Pulse 72  Temp(Src) 97.7 F (36.5 C) (Oral)  Resp 12  Ht 5\' 5"  (1.651 m)  Wt 134 lb 12 oz (  61.122 kg)  BMI 22.42 kg/m2  SpO2 98%  BP Readings from Last 3 Encounters:  09/02/15 140/70  02/28/15 140/88  07/14/14 130/72    Wt Readings from Last 3 Encounters:  09/02/15 134 lb 12 oz (61.122 kg)  02/28/15 137 lb 4 oz (62.256 kg)  07/14/14 136 lb 4 oz (61.803 kg)    General appearance: alert, cooperative and appears stated age Ears: normal TM's and external ear canals both ears Throat: lips, mucosa, and tongue normal; teeth and gums normal Neck: no adenopathy, no carotid bruit, supple, symmetrical, trachea midline and thyroid not enlarged, symmetric, no tenderness/mass/nodules Back: symmetric, no curvature. ROM normal.  No CVA tenderness. Lungs: clear to auscultation bilaterally Heart: regular rate and rhythm, S1, S2 normal, no murmur, click, rub or gallop Abdomen: soft, non-tender; bowel sounds normal; no masses,  no organomegaly Pulses: 2+ and symmetric Skin: Skin color, texture, turgor normal. No rashes or lesions Lymph nodes: Cervical, supraclavicular, and axillary nodes normal.  Lab Results  Component Value Date   HGBA1C 5.4 12/24/2011    Lab Results  Component Value Date   CREATININE 0.80 08/26/2015   CREATININE 0.81 02/23/2015   CREATININE 0.79 07/16/2014    Lab Results  Component Value Date   WBC 4.7 08/26/2015   HGB 10.4* 08/26/2015   HCT 30.4* 08/26/2015   PLT 120.0* 08/26/2015   GLUCOSE 94 08/26/2015   CHOL 121 08/26/2015   TRIG 48.0 08/26/2015   HDL 52.00 08/26/2015   LDLCALC 60 08/26/2015   ALT 9 08/26/2015   AST 14 08/26/2015   NA 139 08/26/2015   K 4.1 08/26/2015   CL 103 08/26/2015   CREATININE 0.80 08/26/2015   BUN 18 08/26/2015   CO2 28 08/26/2015   TSH 2.77 08/26/2015   INR 1.0 02/23/2015   HGBA1C 5.4 12/24/2011     Assessment & Plan:   Problem List Items Addressed This Visit    Hyperlipidemia    Managed with low potency statin, .  TOLERATED,  No changes today .  LFTS normal.  Lab Results  Component Value Date   CHOL 121 08/26/2015   HDL 52.00 08/26/2015   LDLCALC 60 08/26/2015   TRIG 48.0 08/26/2015   CHOLHDL 2 08/26/2015   Lab Results  Component Value Date   ALT 9 08/26/2015   AST 14 08/26/2015   ALKPHOS 68 08/26/2015   BILITOT 0.7 08/26/2015           Adjustment disorder with mixed anxiety and depressed mood    Recommended trial of remeron at bedtime for management of insomnia, fatigue aggravated by husband's unexpected death .  Return in one month .  Prn use of valium reviewed.  The risks and benefits of benzodiazepine use were discussed with patient today including excessive sedation leading to respiratory depression,  impaired  thinking/driving, and addiction.  Patient was advised to avoid concurrent use with alcohol, to use medication only as needed and not to share with others  .       Anemia due to chronic blood loss    Normocytic, stable.   Lab Results  Component Value Date   WBC 4.7 08/26/2015   HGB 10.4* 08/26/2015   HCT 30.4* 08/26/2015   MCV 96.4 08/26/2015   PLT 120.0* 08/26/2015         Constipation    regimen discussed.  No warning signs., but will screen for colon Ca with cologuard.        Other Visit Diagnoses    Anemia  due to other cause    -  Primary    Anemia, iron deficiency        Relevant Orders    Ferritin (Completed)    Iron and TIBC (Completed)       I have discontinued Ms. Ellerman's oxyCODONE-acetaminophen and diazepam. I am also having her start on polyethylene glycol powder, Lactulose, and mirtazapine. Additionally, I am having her maintain her Vitamin D3, CULTURELLE DIGESTIVE HEALTH, famotidine, CoQ10, metoprolol succinate, aspirin, and simvastatin.  Meds ordered this encounter  Medications  . polyethylene glycol powder (MIRALAX) powder    Sig: Mix in 4 ounces of water and take daily to prevent constipation    Dispense:  527 g    Refill:  3  . Lactulose 20 GM/30ML SOLN    Sig: 30 ml every 4 hours until constipation is relieved    Dispense:  236 mL    Refill:  3  . mirtazapine (REMERON) 15 MG tablet    Sig: Take 1 tablet (15 mg total) by mouth at bedtime.    Dispense:  30 tablet    Refill:  3    Medications Discontinued During This Encounter  Medication Reason  . diazepam (VALIUM) 5 MG tablet   . oxyCODONE-acetaminophen (PERCOCET/ROXICET) 5-325 MG per tablet     Follow-up: Return in about 4 weeks (around 09/30/2015).   Sherlene Shams, MD

## 2015-09-02 NOTE — Progress Notes (Signed)
Pre-visit discussion using our clinic review tool. No additional management support is needed unless otherwise documented below in the visit note.  

## 2015-09-03 LAB — IRON AND TIBC
%SAT: 20 % (ref 11–50)
Iron: 53 ug/dL (ref 45–160)
TIBC: 261 ug/dL (ref 250–450)
UIBC: 208 ug/dL (ref 125–400)

## 2015-09-04 DIAGNOSIS — K5900 Constipation, unspecified: Secondary | ICD-10-CM | POA: Insufficient documentation

## 2015-09-04 NOTE — Assessment & Plan Note (Signed)
Managed with low potency statin, .  TOLERATED,  No changes today .  LFTS normal.  Lab Results  Component Value Date   CHOL 121 08/26/2015   HDL 52.00 08/26/2015   LDLCALC 60 08/26/2015   TRIG 48.0 08/26/2015   CHOLHDL 2 08/26/2015   Lab Results  Component Value Date   ALT 9 08/26/2015   AST 14 08/26/2015   ALKPHOS 68 08/26/2015   BILITOT 0.7 08/26/2015

## 2015-09-04 NOTE — Assessment & Plan Note (Addendum)
regimen discussed.  No warning signs., but will screen for colon Ca with cologuard.

## 2015-09-04 NOTE — Assessment & Plan Note (Addendum)
Recommended trial of remeron at bedtime for management of insomnia, fatigue aggravated by husband's unexpected death .  Return in one month .  Prn use of valium reviewed.  The risks and benefits of benzodiazepine use were discussed with patient today including excessive sedation leading to respiratory depression,  impaired thinking/driving, and addiction.  Patient was advised to avoid concurrent use with alcohol, to use medication only as needed and not to share with others  .

## 2015-09-04 NOTE — Assessment & Plan Note (Signed)
Normocytic, stable.   Lab Results  Component Value Date   WBC 4.7 08/26/2015   HGB 10.4* 08/26/2015   HCT 30.4* 08/26/2015   MCV 96.4 08/26/2015   PLT 120.0* 08/26/2015

## 2015-09-05 NOTE — Addendum Note (Signed)
Addended by: Crecencio Mc on: 09/05/2015 08:59 AM   Modules accepted: Orders

## 2015-09-06 NOTE — Progress Notes (Signed)
Cologuard ordered

## 2015-09-12 LAB — COLOGUARD: Cologuard: POSITIVE

## 2015-09-19 ENCOUNTER — Telehealth: Payer: Self-pay

## 2015-09-19 NOTE — Telephone Encounter (Signed)
Patient wants to know if we have received her cologuard results yet.  Thanks Please advise.

## 2015-09-19 NOTE — Telephone Encounter (Signed)
No ,  i reviewed the last 6 telephone messages to be sure.

## 2015-09-26 ENCOUNTER — Other Ambulatory Visit: Payer: Self-pay | Admitting: Internal Medicine

## 2015-09-27 ENCOUNTER — Telehealth: Payer: Self-pay | Admitting: Internal Medicine

## 2015-09-27 DIAGNOSIS — K639 Disease of intestine, unspecified: Secondary | ICD-10-CM

## 2015-09-27 NOTE — Telephone Encounter (Signed)
The results of patient's cologuard is  Positive. This may indicate a polyp somewhere in the colon. Given her concurrent iron deficiency anemia,  I think it would be a good idea to see a general surgeon or gastroenterologist to discuss further evaluation .    Is she willing to see one and does she have a preference?

## 2015-09-27 NOTE — Telephone Encounter (Signed)
Cologuard Positive in yellow folder

## 2015-09-28 NOTE — Telephone Encounter (Signed)
Left message for patient to return call.

## 2015-09-28 NOTE — Telephone Encounter (Signed)
Patient would like to see Dr. Tiffany Kocher since she is already established since having C-diff. Patient also wanted to advise MD she could not take the mirtazapine not even the 0.5 dose.

## 2015-09-28 NOTE — Telephone Encounter (Signed)
Please call patient back after 2:30pm at [phone]

## 2015-09-29 NOTE — Telephone Encounter (Signed)
Patient notified

## 2015-09-29 NOTE — Telephone Encounter (Signed)
Your referral is in process as requested. Our referral coordinator will call you when the appointment has been made.  

## 2015-10-20 ENCOUNTER — Ambulatory Visit: Payer: Medicare Other | Admitting: Internal Medicine

## 2015-10-21 ENCOUNTER — Ambulatory Visit (INDEPENDENT_AMBULATORY_CARE_PROVIDER_SITE_OTHER): Payer: Medicare Other | Admitting: Internal Medicine

## 2015-10-21 ENCOUNTER — Encounter (INDEPENDENT_AMBULATORY_CARE_PROVIDER_SITE_OTHER): Payer: Self-pay

## 2015-10-21 ENCOUNTER — Encounter: Payer: Self-pay | Admitting: Internal Medicine

## 2015-10-21 VITALS — BP 130/60 | HR 72 | Temp 98.1°F | Resp 12 | Ht 65.0 in | Wt 130.5 lb

## 2015-10-21 DIAGNOSIS — D500 Iron deficiency anemia secondary to blood loss (chronic): Secondary | ICD-10-CM

## 2015-10-21 DIAGNOSIS — K5900 Constipation, unspecified: Secondary | ICD-10-CM

## 2015-10-21 DIAGNOSIS — I482 Chronic atrial fibrillation, unspecified: Secondary | ICD-10-CM

## 2015-10-21 DIAGNOSIS — Z780 Asymptomatic menopausal state: Secondary | ICD-10-CM

## 2015-10-21 DIAGNOSIS — D539 Nutritional anemia, unspecified: Secondary | ICD-10-CM | POA: Diagnosis not present

## 2015-10-21 DIAGNOSIS — F4323 Adjustment disorder with mixed anxiety and depressed mood: Secondary | ICD-10-CM

## 2015-10-21 DIAGNOSIS — S72141S Displaced intertrochanteric fracture of right femur, sequela: Secondary | ICD-10-CM | POA: Diagnosis not present

## 2015-10-21 LAB — CBC WITH DIFFERENTIAL/PLATELET
BASOS PCT: 0.6 % (ref 0.0–3.0)
Basophils Absolute: 0 10*3/uL (ref 0.0–0.1)
EOS PCT: 0.6 % (ref 0.0–5.0)
Eosinophils Absolute: 0 10*3/uL (ref 0.0–0.7)
HCT: 32.6 % — ABNORMAL LOW (ref 36.0–46.0)
HEMOGLOBIN: 11.1 g/dL — AB (ref 12.0–15.0)
Lymphocytes Relative: 45.1 % (ref 12.0–46.0)
Lymphs Abs: 2.9 10*3/uL (ref 0.7–4.0)
MCHC: 34.2 g/dL (ref 30.0–36.0)
MCV: 97.1 fl (ref 78.0–100.0)
MONO ABS: 0.4 10*3/uL (ref 0.1–1.0)
MONOS PCT: 6.6 % (ref 3.0–12.0)
Neutro Abs: 3 10*3/uL (ref 1.4–7.7)
Neutrophils Relative %: 47.1 % (ref 43.0–77.0)
Platelets: 145 10*3/uL — ABNORMAL LOW (ref 150.0–400.0)
RBC: 3.36 Mil/uL — AB (ref 3.87–5.11)
RDW: 14.1 % (ref 11.5–15.5)
WBC: 6.4 10*3/uL (ref 4.0–10.5)

## 2015-10-21 LAB — BASIC METABOLIC PANEL
BUN: 23 mg/dL (ref 6–23)
CALCIUM: 9.9 mg/dL (ref 8.4–10.5)
CO2: 27 meq/L (ref 19–32)
CREATININE: 0.83 mg/dL (ref 0.40–1.20)
Chloride: 99 mEq/L (ref 96–112)
GFR: 69.49 mL/min (ref >60.00)
GLUCOSE: 99 mg/dL (ref 70–99)
Potassium: 4.6 mEq/L (ref 3.5–5.1)
SODIUM: 134 meq/L — AB (ref 135–145)

## 2015-10-21 LAB — VITAMIN B12: VITAMIN B 12: 762 pg/mL (ref 211–911)

## 2015-10-21 MED ORDER — ESCITALOPRAM OXALATE 10 MG PO TABS: 10.0000 mg | ORAL_TABLET | Freq: Every day | ORAL | Status: DC

## 2015-10-21 NOTE — Patient Instructions (Addendum)
Take miralax two times  Daily,  You do not need metamucil.   You can use the lactulose twice weekly  If needed for constipation (every 3 days max)    START THE LEXAPRO AT 1/2 TABLET DAILY FOR THE FIRST WEEK,  TAKE AFTER DINNER  Return in one month   .

## 2015-10-21 NOTE — Progress Notes (Signed)
Subjective:  Patient ID: Alicia Moran, female    DOB: 03/28/1929  Age: 81 y.o. MRN: [id number]  CC: The primary encounter diagnosis was Anemia due to chronic blood loss. Diagnoses of Deficiency anemia, Constipation, unspecified constipation type, Intertrochanteric fracture of right hip, sequela, Postmenopausal estrogen deficiency, Chronic atrial fibrillation (HCC), and Adjustment disorder with mixed anxiety and depressed mood were also pertinent to this visit.  HPI Alicia Moran presents for follow up on complicated grief following the  loss of husband in January.  Patient was seen one month ago and prescribed remeron for loss of appetite and insomnia with weight loss noted. She is no longer taking the medication because she states that it made her feel awful  (lethargic) at the starting dose she stopped taking it.  Prior trial of lexapro reviewed,  It was stopped after one day  Still reporting Anorexia, weight loss noted. She lives independently but her middle aged daughter has had to move in with her to continue to received dialysis for ESKD.  She is emotionally burdened by the relationship and the responsibility of caring for her daughter who has no income and is not capable of independent living.  Daughter is reportedly very moody but not physically or verbally abusive.    POSITIVE COLOGUARD  Associated with FH of colon Ca (father) . Her Last colonoscopy was 2005.  Had a positive FOBT in 2015 but had deferred GI workup at that time and was taking anticoagulation at the time for atrial fib and history oof embolic CVA. In 2008. Anticoagulation is now now permanently suspended due to history of  Fractured hip complicated by a subdural hematoma in Dec 2015. .  Currently constipated.  Has tried all OTC meds and did not move bowels for 5 days.  Used 2 doses of lactulose and finally had a BM. Gets nauseated with use of stool softener .   She is ambulatory but uses a wlker.   Has cardiologist  Olga Coaster cardiology at [facility] .   Wants to consider having a local MD bc of transportation issues related to travel .   History of anemia,   Needs b12      Outpatient Prescriptions Prior to Visit  Medication Sig Dispense Refill  . aspirin 81 MG tablet Take 81 mg by mouth daily.    . Cholecalciferol (VITAMIN D3) 3000 UNITS TABS Take 2,000 Units by mouth daily.    . Coenzyme Q10 (COQ10) 50 MG CAPS Take 1 capsule by mouth daily.    . famotidine (PEPCID) 10 MG tablet Take 10 mg by mouth as needed for heartburn or indigestion.    . Lactobacillus-Inulin (CULTURELLE DIGESTIVE HEALTH) CAPS Take 1 capsule by mouth daily. 30 capsule 0  . Lactulose 20 GM/30ML SOLN 30 ml every 4 hours until constipation is relieved 236 mL 3  . metoprolol succinate (TOPROL-XL) 25 MG 24 hr tablet TAKE 1 TABLET EVERY DAY 90 tablet 2  . polyethylene glycol powder (MIRALAX) powder Mix in 4 ounces of water and take daily to prevent constipation 527 g 3  . simvastatin (ZOCOR) 10 MG tablet TAKE 1 TABLET AT BEDTIME 60 tablet 2   No facility-administered medications prior to visit.    Review of Systems;  Patient denies headache, fevers, malaise, unintentional weight loss, skin rash, eye pain, sinus congestion and sinus pain, sore throat, dysphagia,  hemoptysis , cough, dyspnea, wheezing, chest pain, palpitations, orthopnea, edema, abdominal pain, nausea, melena, diarrhea, constipation, flank pain, dysuria, hematuria, urinary  Frequency,  nocturia, numbness, tingling, seizures,  Focal weakness, Loss of consciousness,  Tremor, insomnia, depression, anxiety, and suicidal ideation.      Objective:  BP 130/60 mmHg  Pulse 72  Temp(Src) 98.1 F (36.7 C) (Oral)  Resp 12  Ht 5\' 5"  (1.651 m)  Wt 130 lb 8 oz (59.194 kg)  BMI 21.72 kg/m2  SpO2 96%  BP Readings from Last 3 Encounters:  10/21/15 130/60  09/02/15 140/70  02/28/15 140/88    Wt Readings from Last 3 Encounters:  10/21/15 130 lb 8 oz (59.194 kg)  09/02/15 134  lb 12 oz (61.122 kg)  02/28/15 137 lb 4 oz (62.256 kg)    General appearance: alert, cooperative and appears stated age Ears: normal TM's and external ear canals both ears Throat: lips, mucosa, and tongue normal; teeth and gums normal Neck: no adenopathy, no carotid bruit, supple, symmetrical, trachea midline and thyroid not enlarged, symmetric, no tenderness/mass/nodules Back: symmetric, no curvature. ROM normal. No CVA tenderness. Lungs: clear to auscultation bilaterally Heart: regular rate and rhythm, S1, S2 normal, no murmur, click, rub or gallop Abdomen: soft, non-tender; bowel sounds normal; no masses,  no organomegaly Pulses: 2+ and symmetric Skin: Skin color, texture, turgor normal. No rashes or lesions Lymph nodes: Cervical, supraclavicular, and axillary nodes normal.  Lab Results  Component Value Date   HGBA1C 5.4 12/24/2011    Lab Results  Component Value Date   CREATININE 0.83 10/21/2015   CREATININE 0.80 08/26/2015   CREATININE 0.81 02/23/2015    Lab Results  Component Value Date   WBC 6.4 10/21/2015   HGB 11.1* 10/21/2015   HCT 32.6* 10/21/2015   PLT 145.0* 10/21/2015   GLUCOSE 99 10/21/2015   CHOL 121 08/26/2015   TRIG 48.0 08/26/2015   HDL 52.00 08/26/2015   LDLCALC 60 08/26/2015   ALT 9 08/26/2015   AST 14 08/26/2015   NA 134* 10/21/2015   K 4.6 10/21/2015   CL 99 10/21/2015   CREATININE 0.83 10/21/2015   BUN 23 10/21/2015   CO2 27 10/21/2015   TSH 2.77 08/26/2015   INR 1.0 02/23/2015   HGBA1C 5.4 12/24/2011    Ct Head Limited W/o Cm  09/28/2014  PRIOR REPORT IMPORTED FROM AN EXTERNAL SYSTEM CLINICAL DATA:  82 year old female with history subdural hematoma December 2015. Two prior strokes. Gait disorder. Initial encounter. EXAM: CT HEAD WITHOUT CONTRAST TECHNIQUE: Contiguous axial images were obtained from the base of the skull through the vertex without intravenous contrast. COMPARISON:  06/10/2014 head CT. FINDINGS: Clearing of the previously  noted left tentorial subdural hematoma. No acute hemorrhage detected. No skull fracture. Small vessel disease type changes without CT evidence of large acute infarct. Global atrophy without hydrocephalus. Post right orbital surgery. Mastoid air cells, middle ear cavities and visualized paranasal sinuses are clear with exception minimal mucosal thickening left sphenoid sinus. IMPRESSION: Clearing of previously noted left tentorial subdural hematoma without acute hemorrhage noted. Small vessel disease type changes without CT evidence of large acute infarct. Global atrophy without hydrocephalus. Minimal mucosal thickening left sphenoid sinus. Electronically Signed   By: Lacy Duverney M.D.   On: 09/28/2014 14:00    Assessment & Plan:   Problem List Items Addressed This Visit    Intertrochanteric fracture of right hip (HCC)    Has not had DEXA despite history of hip fracture.  DEXA ordered       Adjustment disorder with mixed anxiety and depressed mood    Did not tolerate remeron.  agreeable to repeat trial  of lexapro .  Titration advised.       Anemia due to chronic blood loss - Primary    Normocytic, mild and stable.  Not iron deficient by march labs.  B12 level is normal.   Lab Results  Component Value Date   WBC 6.4 10/21/2015   HGB 11.1* 10/21/2015   HCT 32.6* 10/21/2015   MCV 97.1 10/21/2015   PLT 145.0* 10/21/2015   Lab Results  Component Value Date   FERRITIN 115.8 09/02/2015   Lab Results  Component Value Date   IRON 53 09/02/2015   TIBC 261 09/02/2015   FERRITIN 115.8 09/02/2015   Lab Results  Component Value Date   VITAMINB12 762 10/21/2015           Relevant Orders   CBC with Differential/Platelet (Completed)   Constipation    Regimen changed to miralax t twice daily and lactulose prn not more than twice weekly.  GI referral in process for positive cologuard, anf FH of colon CA       Atrial fibrillation (HCC)   Relevant Orders   Ambulatory referral to  Cardiology    Other Visit Diagnoses    Deficiency anemia        Relevant Orders    B12 (Completed)    Folate RBC (Completed)    Basic metabolic panel (Completed)    Postmenopausal estrogen deficiency        Relevant Orders    DG Bone Density      A total of 40 minutes was spent with patient more than half of which was spent in counseling patient on the above mentioned issues , reviewing and explaining recent labs and imaging studies done, and coordination of care. I am having Ms. Englander start on escitalopram. I am also having her maintain her Vitamin D3, CULTURELLE DIGESTIVE HEALTH, famotidine, CoQ10, aspirin, simvastatin, polyethylene glycol powder, Lactulose, and metoprolol succinate.  Meds ordered this encounter  Medications  . escitalopram (LEXAPRO) 10 MG tablet    Sig: Take 1 tablet (10 mg total) by mouth daily.    Dispense:  30 tablet    Refill:  1    There are no discontinued medications.  Follow-up: Return in about 1 month (around 11/21/2015) for depression.   Sherlene Shams, MD

## 2015-10-21 NOTE — Progress Notes (Signed)
Pre-visit discussion using our clinic review tool. No additional management support is needed unless otherwise documented below in the visit note.  

## 2015-10-22 LAB — FOLATE RBC: RBC Folate: 805 ng/mL (ref >280)

## 2015-10-23 NOTE — Assessment & Plan Note (Signed)
Regimen changed to miralax t twice daily and lactulose prn not more than twice weekly.  GI referral in process for positive cologuard, anf FH of colon CA

## 2015-10-23 NOTE — Assessment & Plan Note (Signed)
Did not tolerate remeron.  agreeable to repeat trial of lexapro .  Titration advised.

## 2015-10-23 NOTE — Assessment & Plan Note (Signed)
Has not had DEXA despite history of hip fracture.  DEXA ordered

## 2015-10-23 NOTE — Assessment & Plan Note (Signed)
Normocytic, mild and stable.  Not iron deficient by march labs.  B12 level is normal.   Lab Results  Component Value Date   WBC 6.4 10/21/2015   HGB 11.1* 10/21/2015   HCT 32.6* 10/21/2015   MCV 97.1 10/21/2015   PLT 145.0* 10/21/2015   Lab Results  Component Value Date   FERRITIN 115.8 09/02/2015   Lab Results  Component Value Date   IRON 53 09/02/2015   TIBC 261 09/02/2015   FERRITIN 115.8 09/02/2015   Lab Results  Component Value Date   [id number] 10/21/2015

## 2015-10-24 ENCOUNTER — Other Ambulatory Visit: Payer: Self-pay | Admitting: Internal Medicine

## 2015-10-25 ENCOUNTER — Encounter: Payer: Self-pay | Admitting: *Deleted

## 2015-11-01 ENCOUNTER — Other Ambulatory Visit: Payer: Self-pay | Admitting: Student

## 2015-11-01 DIAGNOSIS — K5900 Constipation, unspecified: Secondary | ICD-10-CM

## 2015-11-01 DIAGNOSIS — R634 Abnormal weight loss: Secondary | ICD-10-CM

## 2015-11-16 ENCOUNTER — Ambulatory Visit: Payer: Medicare Other

## 2015-11-25 ENCOUNTER — Encounter: Payer: Self-pay | Admitting: Internal Medicine

## 2015-11-28 ENCOUNTER — Ambulatory Visit (INDEPENDENT_AMBULATORY_CARE_PROVIDER_SITE_OTHER): Payer: Medicare Other | Admitting: Internal Medicine

## 2015-11-28 ENCOUNTER — Encounter: Payer: Self-pay | Admitting: Internal Medicine

## 2015-11-28 VITALS — BP 104/78 | HR 72 | Temp 97.6°F | Resp 12 | Ht 65.0 in | Wt 125.8 lb

## 2015-11-28 DIAGNOSIS — Z634 Disappearance and death of family member: Secondary | ICD-10-CM

## 2015-11-28 DIAGNOSIS — F4329 Adjustment disorder with other symptoms: Secondary | ICD-10-CM

## 2015-11-28 DIAGNOSIS — D500 Iron deficiency anemia secondary to blood loss (chronic): Secondary | ICD-10-CM | POA: Diagnosis not present

## 2015-11-28 DIAGNOSIS — F4321 Adjustment disorder with depressed mood: Secondary | ICD-10-CM | POA: Insufficient documentation

## 2015-11-28 DIAGNOSIS — F4381 Prolonged grief disorder: Secondary | ICD-10-CM

## 2015-11-28 DIAGNOSIS — R195 Other fecal abnormalities: Secondary | ICD-10-CM

## 2015-11-28 DIAGNOSIS — K5901 Slow transit constipation: Secondary | ICD-10-CM

## 2015-11-28 MED ORDER — MIRTAZAPINE 15 MG PO TABS: 15.0000 mg | ORAL_TABLET | Freq: Every day | ORAL | Status: DC

## 2015-11-28 NOTE — Assessment & Plan Note (Signed)
Normocytic, mild and improved  Intolerant of iron supplements ,  Not iron deficient by march labs.  B12 level is normal.   Lab Results  Component Value Date   WBC 6.4 10/21/2015   HGB 11.1* 10/21/2015   HCT 32.6* 10/21/2015   MCV 97.1 10/21/2015   PLT 145.0* 10/21/2015   Lab Results  Component Value Date   FERRITIN 115.8 09/02/2015   Lab Results  Component Value Date   IRON 53 09/02/2015   TIBC 261 09/02/2015   FERRITIN 115.8 09/02/2015   Lab Results  Component Value Date   [id number] 10/21/2015

## 2015-11-28 NOTE — Assessment & Plan Note (Signed)
With anorexia and weight loss .  Urged to retry Remeron.  Lexapro not tolerated.

## 2015-11-28 NOTE — Assessment & Plan Note (Signed)
With FH of colon Ca, positive cologuard and anemia. Agree with contrasted CT first. To rule out mas

## 2015-11-28 NOTE — Assessment & Plan Note (Signed)
Recommended increased intake of water high fiber flatbread's and vegetables and lactulose to be used not more than 3 doses per week

## 2015-11-28 NOTE — Progress Notes (Signed)
Pre-visit discussion using our clinic review tool. No additional management support is needed unless otherwise documented below in the visit note.  

## 2015-11-28 NOTE — Progress Notes (Signed)
Subjective:  Patient ID: Alicia Moran, female    DOB: 1929-05-02  Age: 82 y.o. MRN: [id number]  CC: The primary encounter diagnosis was Slow transit constipation. Diagnoses of Anemia due to chronic blood loss, Complicated grief, and Positive FIT (fecal immunochemical test) were also pertinent to this visit.  HPI Alicia Moran presents for follow up on depression with ongoign weight loss secondary to grief over loss of husband in January,  and IDA with positive  Cologaurd   For the past month she has been taking 1/2 lexapro daily in the evening after supper , and it has been making her nauseated every night.   She has a poor appetite but is eating 3 meals a day, including one  Boost for breakfast  But getting the Calori smart  Which is only 190 calories.    IDA with positive  cologuard : referred to Kerrin Mo.  His NP saw her and has recommended evaluation with abd/pelvis CT scan prior to colonoscopy  And has scheduled it for  June 26   Has had no relief of constipation after a several week trial of amitiza prescribed by GI. Taking stool  Softeners daily .  Bulk forming laxatives have not helped.  Wants ot increase use of lactulose from 2/week to 3/week        Outpatient Prescriptions Prior to Visit  Medication Sig Dispense Refill  . aspirin 81 MG tablet Take 81 mg by mouth daily.    . Cholecalciferol (VITAMIN D3) 3000 UNITS TABS Take 2,000 Units by mouth daily.    . Coenzyme Q10 (COQ10) 50 MG CAPS Take 1 capsule by mouth daily.    Marland Kitchen escitalopram (LEXAPRO) 10 MG tablet Take 1 tablet (10 mg total) by mouth daily. 30 tablet 1  . famotidine (PEPCID) 10 MG tablet Take 10 mg by mouth as needed for heartburn or indigestion.    . Lactobacillus-Inulin (CULTURELLE DIGESTIVE HEALTH) CAPS Take 1 capsule by mouth daily. 30 capsule 0  . Lactulose 20 GM/30ML SOLN 30 ml every 4 hours until constipation is relieved 236 mL 3  . metoprolol succinate (TOPROL-XL) 25 MG 24 hr tablet TAKE 1 TABLET  EVERY DAY 90 tablet 2  . polyethylene glycol powder (MIRALAX) powder Mix in 4 ounces of water and take daily to prevent constipation 527 g 3  . simvastatin (ZOCOR) 10 MG tablet TAKE 1 TABLET AT BEDTIME 90 tablet 3   No facility-administered medications prior to visit.    Review of Systems;  Patient denies headache, fevers, malaise, unintentional weight loss, skin rash, eye pain, sinus congestion and sinus pain, sore throat, dysphagia,  hemoptysis , cough, dyspnea, wheezing, chest pain, palpitations, orthopnea, edema, abdominal pain, nausea, melena, diarrhea, constipation, flank pain, dysuria, hematuria, urinary  Frequency, nocturia, numbness, tingling, seizures,  Focal weakness, Loss of consciousness,  Tremor, insomnia, depression, anxiety, and suicidal ideation.      Objective:  BP 104/78 mmHg  Pulse 72  Temp(Src) 97.6 F (36.4 C) (Oral)  Resp 12  Ht 5\' 5"  (1.651 m)  Wt 125 lb 12 oz (57.04 kg)  BMI 20.93 kg/m2  SpO2 98%  BP Readings from Last 3 Encounters:  11/28/15 104/78  10/21/15 130/60  09/02/15 140/70    Wt Readings from Last 3 Encounters:  11/28/15 125 lb 12 oz (57.04 kg)  10/21/15 130 lb 8 oz (59.194 kg)  09/02/15 134 lb 12 oz (61.122 kg)    General appearance: alert, cooperative and appears stated age Ears: normal TM's and  external ear canals both ears Throat: lips, mucosa, and tongue normal; teeth and gums normal Neck: no adenopathy, no carotid bruit, supple, symmetrical, trachea midline and thyroid not enlarged, symmetric, no tenderness/mass/nodules Back: symmetric, no curvature. ROM normal. No CVA tenderness. Lungs: clear to auscultation bilaterally Heart: regular rate and rhythm, S1, S2 normal, no murmur, click, rub or gallop Abdomen: soft, non-tender; bowel sounds normal; no masses,  no organomegaly Pulses: 2+ and symmetric Skin: Skin color, texture, turgor normal. No rashes or lesions Lymph nodes: Cervical, supraclavicular, and axillary nodes  normal.  Lab Results  Component Value Date   HGBA1C 5.4 12/24/2011    Lab Results  Component Value Date   CREATININE 0.83 10/21/2015   CREATININE 0.80 08/26/2015   CREATININE 0.81 02/23/2015    Lab Results  Component Value Date   WBC 6.4 10/21/2015   HGB 11.1* 10/21/2015   HCT 32.6* 10/21/2015   PLT 145.0* 10/21/2015   GLUCOSE 99 10/21/2015   CHOL 121 08/26/2015   TRIG 48.0 08/26/2015   HDL 52.00 08/26/2015   LDLCALC 60 08/26/2015   ALT 9 08/26/2015   AST 14 08/26/2015   NA 134* 10/21/2015   K 4.6 10/21/2015   CL 99 10/21/2015   CREATININE 0.83 10/21/2015   BUN 23 10/21/2015   CO2 27 10/21/2015   TSH 2.77 08/26/2015   INR 1.0 02/23/2015   HGBA1C 5.4 12/24/2011    Assessment & Plan:   Problem List Items Addressed This Visit    Anemia due to chronic blood loss    Normocytic, mild and improved  Intolerant of iron supplements ,  Not iron deficient by march labs.  B12 level is normal.   Lab Results  Component Value Date   WBC 6.4 10/21/2015   HGB 11.1* 10/21/2015   HCT 32.6* 10/21/2015   MCV 97.1 10/21/2015   PLT 145.0* 10/21/2015   Lab Results  Component Value Date   FERRITIN 115.8 09/02/2015   Lab Results  Component Value Date   IRON 53 09/02/2015   TIBC 261 09/02/2015   FERRITIN 115.8 09/02/2015   Lab Results  Component Value Date   VITAMINB12 762 10/21/2015             Positive FIT (fecal immunochemical test)    With FH of colon Ca, positive cologuard and anemia. Agree with contrasted CT first. To rule out mas       Constipation - Primary    Recommended increased intake of water high fiber flatbread's and vegetables and lactulose to be used not more than 3 doses per week       Complicated grief    With anorexia and weight loss .  Urged to retry Remeron.  Lexapro not tolerated.          I am having [name] maintain her Vitamin D3, CULTURELLE DIGESTIVE HEALTH, famotidine, CoQ10, aspirin, polyethylene glycol powder, Lactulose,  metoprolol succinate, escitalopram, simvastatin, and mirtazapine.  Meds ordered this encounter  Medications  . mirtazapine (REMERON) 15 MG tablet    Sig: Take 1 tablet (15 mg total) by mouth at bedtime.    Dispense:  30 tablet    Refill:  3    Medications Discontinued During This Encounter  Medication Reason  . mirtazapine (REMERON) 15 MG tablet Reorder    Follow-up: No Follow-up on file.   Sherlene Shams, MD

## 2015-11-28 NOTE — Patient Instructions (Addendum)
I am changing lexapro back to  to  Remeron.  Start with  A half tablet at  Your evening meal and increase to a full tablet after 4 days .  I agree with getting the CT scan .  You can drink 2 of your calorie smarts boosts daily or one of the regular boosts daily   You need at least 1000 calories  Daily.  Please try to drink 3 bottles of water daily  Use colace 200 mg daily  And use the lactulose not more than every other day   Eating High fiber breads likes the ones below will help.

## 2015-12-07 ENCOUNTER — Ambulatory Visit
Admission: RE | Admit: 2015-12-07 | Discharge: 2015-12-07 | Disposition: A | Discharge status: Home / Self Care | Payer: Medicare Other | Source: Ambulatory Visit | Attending: Student | Admitting: Student

## 2015-12-07 DIAGNOSIS — K5900 Constipation, unspecified: Secondary | ICD-10-CM | POA: Diagnosis present

## 2015-12-07 DIAGNOSIS — R634 Abnormal weight loss: Secondary | ICD-10-CM | POA: Insufficient documentation

## 2015-12-07 DIAGNOSIS — Q6102 Congenital multiple renal cysts: Secondary | ICD-10-CM | POA: Insufficient documentation

## 2015-12-07 DIAGNOSIS — I700 Atherosclerosis of aorta: Secondary | ICD-10-CM | POA: Insufficient documentation

## 2015-12-07 LAB — POCT I-STAT CREATININE: CREATININE: 0.8 mg/dL (ref 0.44–1.00)

## 2015-12-07 MED ORDER — IOPAMIDOL (ISOVUE-300) INJECTION 61%
100.0000 mL | Freq: Once | INTRAVENOUS | Status: AC | PRN
  Administered 2015-12-07: 100 mL via INTRAVENOUS

## 2015-12-12 ENCOUNTER — Other Ambulatory Visit: Payer: Self-pay | Admitting: Internal Medicine

## 2015-12-12 ENCOUNTER — Telehealth: Payer: Self-pay | Admitting: Internal Medicine

## 2015-12-12 NOTE — Telephone Encounter (Signed)
She does not need to have another DEXA , given the facts she outlined in her very organized letter

## 2015-12-13 NOTE — Telephone Encounter (Signed)
Left message for patient to call office.  

## 2015-12-13 NOTE — Telephone Encounter (Signed)
No problems.  She can continue the valium and stop the remeron.  The CT did not show any suspicious masses .

## 2015-12-13 NOTE — Telephone Encounter (Signed)
Left message for patient to return call.

## 2015-12-13 NOTE — Telephone Encounter (Signed)
Patient was notified concerning Dexa scan  And voiced understanding.  Patient stated that she went for CT of abdomen that was ordered BY the PA [facility], But the PA has moved and no longer with practice. Patient has not received report, report in chart patient ask if MD would review report. Patient also advised that she cannot tolerate the Mirtazapine makes patient feel "horrible" . Patient is using the valium and continues to gain weight and is pleased with the Valium.

## 2015-12-14 NOTE — Telephone Encounter (Signed)
Pt called back returning your call. Thank you! °

## 2015-12-14 NOTE — Telephone Encounter (Signed)
Patient notified

## 2015-12-23 ENCOUNTER — Ambulatory Visit (INDEPENDENT_AMBULATORY_CARE_PROVIDER_SITE_OTHER): Payer: Medicare Other | Admitting: Cardiovascular Disease

## 2015-12-23 ENCOUNTER — Encounter (INDEPENDENT_AMBULATORY_CARE_PROVIDER_SITE_OTHER): Payer: Self-pay

## 2015-12-23 ENCOUNTER — Encounter: Payer: Self-pay | Admitting: Cardiovascular Disease

## 2015-12-23 VITALS — BP 130/82 | HR 72 | Ht 65.0 in | Wt 130.0 lb

## 2015-12-23 DIAGNOSIS — I38 Endocarditis, valve unspecified: Secondary | ICD-10-CM

## 2015-12-23 NOTE — Patient Instructions (Signed)
Medication Instructions: Continue same medications.   Labwork: None.   Procedures/Testing: None.   Follow-Up: 1 year with Dr. Aiyana Stegmann.   Any Additional Special Instructions Will Be Listed Below (If Applicable).     If you need a refill on your cardiac medications before your next appointment, please call your pharmacy.   

## 2015-12-23 NOTE — Progress Notes (Signed)
Cardiology Office Note   Date:  12/23/2015   ID:  MICHELLE REDPATH, DOB [birth year], MRN [id number]  PCP:  Sherlene Shams, MD  Cardiologist:   Lorine Bears, MD   No chief complaint on file.     History of Present Illness: Alicia Moran is a 82 y.o. female who was referred by Dr.Tullo to establish cardiovascular care. The patient has been followed in the past at [facility] cardiology for mild valvular heart disease. Paroxysmal atrial fibrillation is mentioned in her chart. However, I reviewed almost all the cardiology notes from cardiology at [facility] and there is no mention of that. It appears that in 2008 she suffered from a stroke in spite of being on Plavix. Thus, she was started on anticoagulation with warfarin at that time for that indication. There has been no documented atrial fibrillation. She suffered from subdural hematoma in 2015 after a fall and thus warfarin was discontinued at that time and was not resumed. The patient continued to be unsteady. She reports having cardiac catheterization in 2006 at [facility] with no significant obstructive disease according to the patient. She reports being on Toprol for skipped beats and has history of hyperlipidemia treated with simvastatin. At the present time she seems to be doing reasonably well and denies any chest pain, shortness of breath or syncope.   Past Medical History  Diagnosis Date  . Atrial fibrillation (HCC)   . Migraine headache   . Hyperlipidemia   . Stroke [facility])     No past surgical history on file.   Current Outpatient Prescriptions  Medication Sig Dispense Refill  . aspirin 81 MG tablet Take 81 mg by mouth daily.    . Cholecalciferol (EQL VITAMIN D3) 2000 units CAPS Take 1 capsule by mouth daily.    . Cholecalciferol (VITAMIN D3) 3000 UNITS TABS Take 2,000 Units by mouth daily.    . Coenzyme Q10 (COQ10) 50 MG CAPS Take 1 capsule by mouth daily.    . diazepam (VALIUM) 5 MG tablet TAKE ONE TABLET EVERY 12 HOURS IF NEEDED  FOR ANXIETY 30 tablet 3  . docusate sodium (COLACE) 100 MG capsule Take 100 mg by mouth 2 (two) times daily.    . famotidine (PEPCID) 10 MG tablet Take 10 mg by mouth as needed for heartburn or indigestion.    . Glucosamine-Chondroitin (COSAMIN DS) 500-400 MG CAPS Take 1-2 capsules by mouth daily.    . Lactobacillus-Inulin (CULTURELLE DIGESTIVE HEALTH) CAPS Take 1 capsule by mouth daily. 30 capsule 0  . Lactulose 20 GM/30ML SOLN 30 ml every 4 hours until constipation is relieved 236 mL 3  . metoprolol succinate (TOPROL-XL) 25 MG 24 hr tablet TAKE 1 TABLET EVERY DAY 90 tablet 2  . simvastatin (ZOCOR) 10 MG tablet TAKE 1 TABLET AT BEDTIME 90 tablet 3   No current facility-administered medications for this visit.    Allergies:   Amoxicillin and Erythromycin    Social History:  The patient  reports that she quit smoking about 29 years ago. She has never used smokeless tobacco. She reports that she does not drink alcohol or use illicit drugs.   Family History:  The patient's family history includes Diabetes in her mother; Hypertension in her father and mother.    ROS:  Please see the history of present illness.   Otherwise, review of systems are positive for none.   All other systems are reviewed and negative.    PHYSICAL EXAM: VS:  BP 130/82 mmHg  Pulse  72  Ht 5\' 5"  (1.651 m)  Wt 130 lb (58.968 kg)  BMI 21.63 kg/m2 , BMI Body mass index is 21.63 kg/(m^2). GEN: Well nourished, well developed, in no acute distress HEENT: normal Neck: no JVD, carotid bruits, or masses Cardiac: RRR; no rubs, or gallops,no edema . One out of 6 systolic murmur in the aortic area. Respiratory:  clear to auscultation bilaterally, normal work of breathing GI: soft, nontender, nondistended, + BS MS: no deformity or atrophy Skin: warm and dry, no rash Neuro:  Strength and sensation are intact Psych: euthymic mood, full affect   EKG:  EKG is ordered today. The ekg ordered today demonstrates normal sinus  rhythm with no significant ST or T wave changes.   Recent Labs: 08/26/2015: ALT 9; TSH 2.77 10/21/2015: BUN 23; Hemoglobin 11.1*; Platelets 145.0*; Potassium 4.6; Sodium 134* 12/07/2015: Creatinine, Ser 0.80    Lipid Panel    Component Value Date/Time   CHOL 121 08/26/2015 0941   TRIG 48.0 08/26/2015 0941   HDL 52.00 08/26/2015 0941   CHOLHDL 2 08/26/2015 0941   VLDL 9.6 08/26/2015 0941   LDLCALC 60 08/26/2015 0941      Wt Readings from Last 3 Encounters:  12/23/15 130 lb (58.968 kg)  11/28/15 125 lb 12 oz (57.04 kg)  10/21/15 130 lb 8 oz (59.194 kg)        ASSESSMENT AND PLAN:  1.  Mild valvular heart disease: Most recent echocardiogram at [facility] in September 2016 showed normal LV systolic function, mild to moderate mitral regurgitation, mild aortic regurgitation and mild tricuspid regurgitation. She is currently not having any cardiac symptoms. Continue to follow clinically.  2. Previous history of stroke: No documented history of atrial fibrillation. She was placed on warfarin in the past due to having a stroke while she was on Plavix. Given her recurrent falls and subdural hematoma in late 2015, I think the risk of anticoagulation outweighs the benefit. If she develops documented atrial fibrillation, then anticoagulation is indicated.  3. Essential hypertension: Blood pressure is reasonably controlled.       Disposition:   FU with me in 1 year  Signed,  Lorine Bears, MD  12/23/2015 1:30 PM    [facility]

## 2015-12-26 ENCOUNTER — Ambulatory Visit: Payer: Medicare Other

## 2015-12-28 ENCOUNTER — Ambulatory Visit: Payer: Medicare Other

## 2016-03-02 ENCOUNTER — Ambulatory Visit: Payer: Medicare Other | Admitting: Internal Medicine

## 2016-03-05 ENCOUNTER — Ambulatory Visit: Payer: Medicare Other | Admitting: Internal Medicine

## 2016-03-19 ENCOUNTER — Ambulatory Visit (INDEPENDENT_AMBULATORY_CARE_PROVIDER_SITE_OTHER): Payer: Medicare Other | Admitting: Internal Medicine

## 2016-03-19 ENCOUNTER — Encounter: Payer: Self-pay | Admitting: Internal Medicine

## 2016-03-19 VITALS — BP 128/70 | HR 70 | Temp 97.7°F | Resp 12 | Ht 65.0 in | Wt 133.5 lb

## 2016-03-19 DIAGNOSIS — D638 Anemia in other chronic diseases classified elsewhere: Secondary | ICD-10-CM

## 2016-03-19 DIAGNOSIS — F4323 Adjustment disorder with mixed anxiety and depressed mood: Secondary | ICD-10-CM

## 2016-03-19 DIAGNOSIS — Z23 Encounter for immunization: Secondary | ICD-10-CM

## 2016-03-19 DIAGNOSIS — E559 Vitamin D deficiency, unspecified: Secondary | ICD-10-CM

## 2016-03-19 DIAGNOSIS — Z79899 Other long term (current) drug therapy: Secondary | ICD-10-CM

## 2016-03-19 DIAGNOSIS — D500 Iron deficiency anemia secondary to blood loss (chronic): Secondary | ICD-10-CM

## 2016-03-19 DIAGNOSIS — S72144S Nondisplaced intertrochanteric fracture of right femur, sequela: Secondary | ICD-10-CM

## 2016-03-19 DIAGNOSIS — R195 Other fecal abnormalities: Secondary | ICD-10-CM

## 2016-03-19 DIAGNOSIS — M810 Age-related osteoporosis without current pathological fracture: Secondary | ICD-10-CM

## 2016-03-19 DIAGNOSIS — D508 Other iron deficiency anemias: Secondary | ICD-10-CM | POA: Diagnosis not present

## 2016-03-19 DIAGNOSIS — I70209 Unspecified atherosclerosis of native arteries of extremities, unspecified extremity: Secondary | ICD-10-CM

## 2016-03-19 LAB — CBC WITH DIFFERENTIAL/PLATELET
BASOS ABS: 0 10*3/uL (ref 0.0–0.1)
Basophils Relative: 0.8 % (ref 0.0–3.0)
EOS ABS: 0 10*3/uL (ref 0.0–0.7)
Eosinophils Relative: 0.6 % (ref 0.0–5.0)
HEMATOCRIT: 31.9 % — AB (ref 36.0–46.0)
Hemoglobin: 10.7 g/dL — ABNORMAL LOW (ref 12.0–15.0)
LYMPHS PCT: 47 % — AB (ref 12.0–46.0)
Lymphs Abs: 2.5 10*3/uL (ref 0.7–4.0)
MCHC: 33.5 g/dL (ref 30.0–36.0)
MCV: 98 fl (ref 78.0–100.0)
MONOS PCT: 6.1 % (ref 3.0–12.0)
Monocytes Absolute: 0.3 10*3/uL (ref 0.1–1.0)
NEUTROS ABS: 2.4 10*3/uL (ref 1.4–7.7)
NEUTROS PCT: 45.5 % (ref 43.0–77.0)
PLATELETS: 116 10*3/uL — AB (ref 150.0–400.0)
RBC: 3.26 Mil/uL — AB (ref 3.87–5.11)
RDW: 14.1 % (ref 11.5–15.5)
WBC: 5.4 10*3/uL (ref 4.0–10.5)

## 2016-03-19 LAB — COMPREHENSIVE METABOLIC PANEL
ALT: 13 U/L (ref 0–35)
AST: 17 U/L (ref 0–37)
Albumin: 4.1 g/dL (ref 3.5–5.2)
Alkaline Phosphatase: 74 U/L (ref 39–117)
BUN: 26 mg/dL — ABNORMAL HIGH (ref 6–23)
CALCIUM: 9.2 mg/dL (ref 8.4–10.5)
CHLORIDE: 103 meq/L (ref 96–112)
CO2: 28 meq/L (ref 19–32)
Creatinine, Ser: 0.74 mg/dL (ref 0.40–1.20)
GFR: 79.25 mL/min (ref >60.00)
GLUCOSE: 94 mg/dL (ref 70–99)
Potassium: 4.2 mEq/L (ref 3.5–5.1)
Sodium: 138 mEq/L (ref 135–145)
Total Bilirubin: 0.5 mg/dL (ref 0.2–1.2)
Total Protein: 7.2 g/dL (ref 6.0–8.3)

## 2016-03-19 LAB — IRON AND TIBC
%SAT: 19 % (ref 11–50)
Iron: 52 ug/dL (ref 45–160)
TIBC: 278 ug/dL (ref 250–450)
UIBC: 226 ug/dL (ref 125–400)

## 2016-03-19 LAB — VITAMIN B12: VITAMIN B 12: 287 pg/mL (ref 211–911)

## 2016-03-19 LAB — VITAMIN D 25 HYDROXY (VIT D DEFICIENCY, FRACTURES): VITD: 50.45 ng/mL (ref 30.00–100.00)

## 2016-03-19 MED ORDER — PAROXETINE HCL 10 MG PO TABS: 10.0000 mg | ORAL_TABLET | Freq: Every day | ORAL | 2 refills | Status: DC

## 2016-03-19 NOTE — Progress Notes (Signed)
Subjective:  Patient ID: [name], female    DOB: 1929/06/02  Age: 82 y.o. MRN: [id number]  CC: The primary encounter diagnosis was Other iron deficiency anemia. Diagnoses of Vitamin D deficiency, Long-term use of high-risk medication, Anemia of chronic disease, Encounter for immunization, Atherosclerotic peripheral vascular disease (HCC), Positive FIT (fecal immunochemical test), Closed nondisplaced intertrochanteric fracture of right femur, sequela, Age-related osteoporosis without current pathological fracture, Adjustment disorder with mixed anxiety and depressed mood, and Anemia due to chronic blood loss were also pertinent to this visit.  HPI Alicia Moran presents for FOLLOW UP on multiple issues  1) Generalized Anxiety and weight loss.  Symptoms aggravated by complicated grief following the loss of husband several months ago,  and worsening health of financially dependent daughter wh os now on hemodialysis .  Did not tolerate lexapro due to persistent  Nausea .  She was prescribed 7.5 mg of  Remeron at last visit in June .  Her symptoms  improved  With use of valium 1/2 tablet prn , not daily.  She did not  TOLERATE REMERON but has  gained 8 lbs since June.  Crying a lot,  She has been missing her long dead parents .   2) Positive Cologuard. Sent to Dr Mechele Collin for consideration of colonoscopy given worsening constipation. Constipation did not resolve with amitiza trial,  Using lactulose every every 2-3 nights,  Stool softener daily 300 mg   Has increased fiber and water   CT ordered by his NP.  CT SCAN REVIEWED, and no masses werer found.  Colonoscopy was offered "at increased risk."  Patient still undecided about whether to pursue.  Worried about an event given her daughter's dependence on her.  Marland Kitchen    3) Aortic atherosclerosis noted  On CT scan and discussed today  4)Osteoporosis suggested by CT scan with no recent DEXA per patient preference. .  Prior trials of fosomax and  Pamidronate  caused loss of teeth. Discussed Prolia, but does not want to have DEXA.    Does not want to have colonoscopy    Outpatient Medications Prior to Visit  Medication Sig Dispense Refill  . aspirin 81 MG tablet Take 81 mg by mouth daily.    . Cholecalciferol (EQL VITAMIN D3) 2000 units CAPS Take 1 capsule by mouth daily.    . Coenzyme Q10 (COQ10) 50 MG CAPS Take 1 capsule by mouth daily.    . diazepam (VALIUM) 5 MG tablet TAKE ONE TABLET EVERY 12 HOURS IF NEEDED FOR ANXIETY 30 tablet 3  . docusate sodium (COLACE) 100 MG capsule Take 100 mg by mouth 2 (two) times daily.    . famotidine (PEPCID) 10 MG tablet Take 10 mg by mouth as needed for heartburn or indigestion.    . Glucosamine-Chondroitin (COSAMIN DS) 500-400 MG CAPS Take 1-2 capsules by mouth daily.    . Lactobacillus-Inulin (CULTURELLE DIGESTIVE HEALTH) CAPS Take 1 capsule by mouth daily. 30 capsule 0  . Lactulose 20 GM/30ML SOLN 30 ml every 4 hours until constipation is relieved 236 mL 3  . metoprolol succinate (TOPROL-XL) 25 MG 24 hr tablet TAKE 1 TABLET EVERY DAY 90 tablet 2  . simvastatin (ZOCOR) 10 MG tablet TAKE 1 TABLET AT BEDTIME 90 tablet 3  . Cholecalciferol (VITAMIN D3) 3000 UNITS TABS Take 2,000 Units by mouth daily.     No facility-administered medications prior to visit.     Review of Systems;  Patient denies headache, fevers, malaise, unintentional weight loss, skin  rash, eye pain, sinus congestion and sinus pain, sore throat, dysphagia,  hemoptysis , cough, dyspnea, wheezing, chest pain, palpitations, orthopnea, edema, abdominal pain, nausea, melena, diarrhea, constipation, flank pain, dysuria, hematuria, urinary  Frequency, nocturia, numbness, tingling, seizures,  Focal weakness, Loss of consciousness,  Tremor, insomnia, depression, anxiety, and suicidal ideation.      Objective:  BP 128/70   Pulse 70   Temp 97.7 F (36.5 C) (Oral)   Resp 12   Ht 5\' 5"  (1.651 m)   Wt 133 lb 8 oz (60.6 kg)   SpO2  96%   BMI 22.22 kg/m   BP Readings from Last 3 Encounters:  03/19/16 128/70  12/23/15 130/82  11/28/15 104/78    Wt Readings from Last 3 Encounters:  03/19/16 133 lb 8 oz (60.6 kg)  12/23/15 130 lb (59 kg)  11/28/15 125 lb 12 oz (57 kg)    General appearance: alert, cooperative and appears stated age Ears: normal TM's and external ear canals both ears Throat: lips, mucosa, and tongue normal; teeth and gums normal Neck: no adenopathy, no carotid bruit, supple, symmetrical, trachea midline and thyroid not enlarged, symmetric, no tenderness/mass/nodules Back: symmetric, no curvature. ROM normal. No CVA tenderness. Lungs: clear to auscultation bilaterally Heart: regular rate and rhythm, S1, S2 normal, no murmur, click, rub or gallop Abdomen: soft, non-tender; bowel sounds normal; no masses,  no organomegaly Pulses: 2+ and symmetric Skin: Skin color, texture, turgor normal. No rashes or lesions Lymph nodes: Cervical, supraclavicular, and axillary nodes normal.  Lab Results  Component Value Date   HGBA1C 5.4 12/24/2011    Lab Results  Component Value Date   CREATININE 0.74 03/19/2016   CREATININE 0.80 12/07/2015   CREATININE 0.83 10/21/2015    Lab Results  Component Value Date   WBC 5.4 03/19/2016   HGB 10.7 (L) 03/19/2016   HCT 31.9 (L) 03/19/2016   PLT 116.0 (L) 03/19/2016   GLUCOSE 94 03/19/2016   CHOL 121 08/26/2015   TRIG 48.0 08/26/2015   HDL 52.00 08/26/2015   LDLCALC 60 08/26/2015   ALT 13 03/19/2016   AST 17 03/19/2016   NA 138 03/19/2016   K 4.2 03/19/2016   CL 103 03/19/2016   CREATININE 0.74 03/19/2016   BUN 26 (H) 03/19/2016   CO2 28 03/19/2016   TSH 2.77 08/26/2015   INR 1.0 02/23/2015   HGBA1C 5.4 12/24/2011    Ct Abdomen Pelvis W Contrast  Result Date: 12/07/2015 CLINICAL DATA:  Constipation for 6 months, 7-10 pounds of weight loss since January 2017, history hyperlipidemia, stroke, atrial fibrillation, former smoker EXAM: CT ABDOMEN AND  PELVIS WITH CONTRAST TECHNIQUE: Multidetector CT imaging of the abdomen and pelvis was performed using the standard protocol following bolus administration of intravenous contrast. Sagittal and coronal MPR images reconstructed from axial data set. CONTRAST:  ISOVUE-300 IOPAMIDOL (ISOVUE-300) INJECTION 61% IV. Dilute oral contrast. COMPARISON:  Bony CT pelvis 08/12/2008 FINDINGS: Lower chest:  Minimal subsegmental atelectasis at lung bases Hepatobiliary: Scattered hepatic cysts. Calcified granulomata within liver. Remainder of liver and gallbladder unremarkable. No biliary dilatation. Pancreas: Normal appearance Spleen: Normal appearance Adrenals/Urinary Tract: Adrenal glands normal appearance. Cyst at upper pole RIGHT kidney 2.5 x 2.2 cm image 19. Kidneys otherwise normal appearance. No renal mass, hydronephrosis or ureteral dilatation. Bladder unremarkable. Stomach/Bowel: Appendix not identified. Stomach incompletely distended with suboptimal assessment of wall thickness. Bowel loops normal appearance. Vascular/Lymphatic: Scattered atherosclerotic calcifications aorta, iliac arteries, femoral arteries, and coronary arteries. No adenopathy. Reproductive: Atrophic uterus and ovaries.  Other: No free air, free fluid, mass or hernia. Musculoskeletal: Bones diffusely demineralized. Orthopedic hardware within proximal RIGHT femur. IMPRESSION: Hepatic and RIGHT renal cysts. No acute intra-abdominal or intrapelvic abnormalities. Aortic atherosclerosis. Electronically Signed   By: Ulyses Southward M.D.   On: 12/07/2015 13:02    Assessment & Plan:   Problem List Items Addressed This Visit    Intertrochanteric fracture of right hip (HCC)    Secondary to osteoporosis,  Which remains untreated for several years due to bone loss in mouth while taking bisphosphonates.  DEXA and Prolia discussed but declined today by patient       Adjustment disorder with mixed anxiety and depressed mood    She continues to report  intolerance to anything  but valium.  Trial of Paxil advised  (Lexapro and Remeron not tolerated)      Anemia due to chronic blood loss    Normocytic, slightly worse  With normla iron and B12 stores .    Lab Results  Component Value Date   WBC 5.4 03/19/2016   HGB 10.7 (L) 03/19/2016   HCT 31.9 (L) 03/19/2016   MCV 98.0 03/19/2016   PLT 116.0 (L) 03/19/2016   Lab Results  Component Value Date   FERRITIN 115.8 09/02/2015   Lab Results  Component Value Date   IRON 52 03/19/2016   TIBC 278 03/19/2016   FERRITIN 115.8 09/02/2015   Lab Results  Component Value Date   VITAMINB12 287 03/19/2016             Positive FIT (fecal immunochemical test)    With FH of colon Ca, positive cologuard and anemia.   contrasted CT  Ruled out mass.  She remains undecided about whether to pursue colonoscopy given her age  .  I suggested that if her anemia worsens,  She follow up with Dr Mechele Collin for colonoscopy .  Her anemia slightly , will recommend colonoscopy.  Lab Results  Component Value Date   WBC 5.4 03/19/2016   HGB 10.7 (L) 03/19/2016   HCT 31.9 (L) 03/19/2016   MCV 98.0 03/19/2016   PLT 116.0 (L) 03/19/2016   Lab Results  Component Value Date   CREATININE 0.74 03/19/2016         Atherosclerotic peripheral vascular disease (HCC)    She has aortic atherosclerosis , asymptomatic , and recent evaluation of carotid arteries given persistent sensation of pulsations in right ear . nonsignifcant stenoses noted. Advised to continue statin and aspirin       Osteoporosis    With prior  Right hip fracture, prior treatment with bisphosphonates,  Declines repeat evaluation and treatment with Prolia        Other Visit Diagnoses    Other iron deficiency anemia    -  Primary   Relevant Orders   CBC with Differential/Platelet (Completed)   Iron and TIBC (Completed)   Vitamin D deficiency       Relevant Orders   VITAMIN D 25 Hydroxy (Vit-D Deficiency, Fractures) (Completed)    Long-term use of high-risk medication       Relevant Orders   Comprehensive metabolic panel (Completed)   Anemia of chronic disease       Encounter for immunization       Relevant Orders   Flu vaccine HIGH DOSE PF (Completed)      I have discontinued Ms. Stamps's Vitamin D3. I am also having her start on PARoxetine. Additionally, I am having her maintain her CULTURELLE DIGESTIVE HEALTH, famotidine, CoQ10, aspirin,  Lactulose, metoprolol succinate, simvastatin, diazepam, Cholecalciferol, docusate sodium, and Glucosamine-Chondroitin.  Meds ordered this encounter  Medications  . PARoxetine (PAXIL) 10 MG tablet    Sig: Take 1 tablet (10 mg total) by mouth daily.    Dispense:  30 tablet    Refill:  2    Medications Discontinued During This Encounter  Medication Reason  . Cholecalciferol (VITAMIN D3) 3000 UNITS TABS Duplicate    Follow-up: Return in about 3 months (around 06/19/2016).   Sherlene Shams, MD

## 2016-03-19 NOTE — Patient Instructions (Addendum)
I agree that you need an anti depressant,  But not Lexapro,  It made you nauseated.  Please start the Paxil  at 1/2 tablet daily in the evening for the first few days to avoid nausea.  You can increase to a full tablet after 4 days if you have  not developed side effects of nausea.  If thePaxil interferes with your sleep, take it in the morning instead  Please return in  3 months ,  Call me if you decide to stop the medication  Try adding metamucil daily, to your stool softener  Goal is to reduce use of of lactulose  to once a week   You are cleared for cataracts surgery    If your hemoglobin has dropped anymore,  I recommend that  You should have the colonoscopy to find out why the Cologuard is postive

## 2016-03-19 NOTE — Progress Notes (Signed)
Pre-visit discussion using our clinic review tool. No additional management support is needed unless otherwise documented below in the visit note.  

## 2016-03-20 DIAGNOSIS — M810 Age-related osteoporosis without current pathological fracture: Secondary | ICD-10-CM | POA: Insufficient documentation

## 2016-03-20 NOTE — Assessment & Plan Note (Signed)
Secondary to osteoporosis,  Which remains untreated for several years due to bone loss in mouth while taking bisphosphonates.  DEXA and Prolia discussed but declined today by patient

## 2016-03-20 NOTE — Assessment & Plan Note (Addendum)
Normocytic, slightly worse  With normla iron and B12 stores .    Lab Results  Component Value Date   WBC 5.4 03/19/2016   HGB 10.7 (L) 03/19/2016   HCT 31.9 (L) 03/19/2016   MCV 98.0 03/19/2016   PLT 116.0 (L) 03/19/2016   Lab Results  Component Value Date   FERRITIN 115.8 09/02/2015   Lab Results  Component Value Date   IRON 52 03/19/2016   TIBC 278 03/19/2016   FERRITIN 115.8 09/02/2015   Lab Results  Component Value Date   VITAMINB12 287 03/19/2016

## 2016-03-20 NOTE — Assessment & Plan Note (Signed)
She continues to report intolerance to anything  but valium.  Trial of Paxil advised  (Lexapro and Remeron not tolerated)

## 2016-03-20 NOTE — Assessment & Plan Note (Signed)
She has aortic atherosclerosis , asymptomatic , and recent evaluation of carotid arteries given persistent sensation of pulsations in right ear . nonsignifcant stenoses noted. Advised to continue statin and aspirin  

## 2016-03-20 NOTE — Assessment & Plan Note (Signed)
With prior  Right hip fracture, prior treatment with bisphosphonates,  Declines repeat evaluation and treatment with Prolia

## 2016-03-20 NOTE — Assessment & Plan Note (Addendum)
With FH of colon Ca, positive cologuard and anemia.   contrasted CT  Ruled out mass.  She remains undecided about whether to pursue colonoscopy given her age  .  I suggested that if her anemia worsens,  She follow up with Dr Vira Agar for colonoscopy .  Her anemia slightly , will recommend colonoscopy.  Lab Results  Component Value Date   WBC 5.4 03/19/2016   HGB 10.7 (L) 03/19/2016   HCT 31.9 (L) 03/19/2016   MCV 98.0 03/19/2016   PLT 116.0 (L) 03/19/2016   Lab Results  Component Value Date   CREATININE 0.74 03/19/2016

## 2016-03-21 LAB — FOLATE RBC: RBC Folate: 936 ng/mL (ref >280)

## 2016-03-21 LAB — ERYTHROPOIETIN: Erythropoietin: 22.3 m[IU]/mL — ABNORMAL HIGH (ref 2.6–18.5)

## 2016-03-22 ENCOUNTER — Encounter: Payer: Self-pay | Admitting: *Deleted

## 2016-03-22 ENCOUNTER — Telehealth: Payer: Self-pay | Admitting: Internal Medicine

## 2016-03-22 NOTE — Telephone Encounter (Signed)
Pt called requesting lab results. Thank you!  Call pt @ [phone]

## 2016-03-22 NOTE — Telephone Encounter (Signed)
Given see result note.

## 2016-03-23 ENCOUNTER — Other Ambulatory Visit: Payer: Self-pay | Admitting: Internal Medicine

## 2016-03-23 DIAGNOSIS — D500 Iron deficiency anemia secondary to blood loss (chronic): Secondary | ICD-10-CM

## 2016-03-23 DIAGNOSIS — R195 Other fecal abnormalities: Secondary | ICD-10-CM

## 2016-03-26 ENCOUNTER — Telehealth: Payer: Self-pay | Admitting: Internal Medicine

## 2016-03-26 NOTE — Telephone Encounter (Signed)
Called patient she is coughing on phone , sounds congested I have scheduled patient with NP for tomorrow has no transportation for today. Patient stated she cannot take  Antihistamines  or decongestants they dry her throat out to much. Patient symptoms started on Friday with a sore throat  And  Sinus drainage has progressed to cough with congestion in chest  Patient inquired can she take delsym until seen.

## 2016-03-26 NOTE — Telephone Encounter (Signed)
Patient notified and voiced understanding.

## 2016-03-26 NOTE — Telephone Encounter (Signed)
Pt called and stated that she has been taking Delsum for coughing but she says that it drying her throat. Is there anything else that she can take. Please advise, thank you!  Call pt [phone]

## 2016-03-26 NOTE — Telephone Encounter (Signed)
Yes she can use Delsym for cough and use Neil Med's sinus flush up to twice daily

## 2016-03-27 ENCOUNTER — Ambulatory Visit (INDEPENDENT_AMBULATORY_CARE_PROVIDER_SITE_OTHER): Payer: Medicare Other | Admitting: Family

## 2016-03-27 ENCOUNTER — Telehealth: Payer: Self-pay | Admitting: *Deleted

## 2016-03-27 ENCOUNTER — Ambulatory Visit (INDEPENDENT_AMBULATORY_CARE_PROVIDER_SITE_OTHER): Payer: Medicare Other

## 2016-03-27 ENCOUNTER — Encounter: Payer: Self-pay | Admitting: Internal Medicine

## 2016-03-27 ENCOUNTER — Other Ambulatory Visit: Payer: Self-pay | Admitting: Family

## 2016-03-27 ENCOUNTER — Encounter: Payer: Self-pay | Admitting: Family

## 2016-03-27 VITALS — BP 116/80 | HR 76 | Temp 97.9°F | Wt 130.8 lb

## 2016-03-27 DIAGNOSIS — R05 Cough: Secondary | ICD-10-CM

## 2016-03-27 DIAGNOSIS — R059 Cough, unspecified: Secondary | ICD-10-CM

## 2016-03-27 DIAGNOSIS — I70209 Unspecified atherosclerosis of native arteries of extremities, unspecified extremity: Secondary | ICD-10-CM

## 2016-03-27 DIAGNOSIS — J189 Pneumonia, unspecified organism: Secondary | ICD-10-CM

## 2016-03-27 DIAGNOSIS — J181 Lobar pneumonia, unspecified organism: Principal | ICD-10-CM

## 2016-03-27 MED ORDER — METRONIDAZOLE 500 MG PO TABS: 500.0000 mg | ORAL_TABLET | Freq: Two times a day (BID) | ORAL | 0 refills | Status: DC

## 2016-03-27 MED ORDER — DOXYCYCLINE HYCLATE 100 MG PO TABS: 100.0000 mg | ORAL_TABLET | Freq: Two times a day (BID) | ORAL | 0 refills | Status: DC

## 2016-03-27 MED ORDER — LEVOFLOXACIN 750 MG PO TABS: 750.0000 mg | ORAL_TABLET | Freq: Every day | ORAL | 0 refills | Status: DC

## 2016-03-27 MED ORDER — BENZONATATE 100 MG PO CAPS: 100.0000 mg | ORAL_CAPSULE | Freq: Three times a day (TID) | ORAL | 1 refills | Status: DC | PRN

## 2016-03-27 NOTE — Telephone Encounter (Signed)
Pt has requested a call to discuss her antibiotic, she would like a better understanding of the medication Pt contact [phone]

## 2016-03-27 NOTE — Telephone Encounter (Signed)
Patient would like to speak with PCP about the antibiotics for her LLL PNA.  Patient feels there maybe a another drug for her PNA than Levaquin. Please advise.

## 2016-03-27 NOTE — Progress Notes (Signed)
Patient has been notified

## 2016-03-27 NOTE — Progress Notes (Signed)
Subjective:    Patient ID: Alicia Moran, female    DOB: [birth year], 82 y.o.   MRN: [id number]  CC: Alicia Moran is a 82 y.o. female who presents today for an acute visit.    HPI: Patient here for acute visit with chief complaint of cough 3 days ago, started with sore throat and nasal congestion x 1 week. Endorses nasal congestion, chills. Has tried delsym with minimal relief.No fever, SOB, wheezing.    No h/o  Lung disease.   H/o PNA and C.diff from antibiotics     HISTORY:  Past Medical History:  Diagnosis Date  . Atrial fibrillation (HCC)   . Hyperlipidemia   . Migraine headache   . Stroke [facility])    History reviewed. No pertinent surgical history. Family History  Problem Relation Age of Onset  . Hypertension Mother   . Diabetes Mother   . Hypertension Father     Allergies: Amoxicillin and Erythromycin Current Outpatient Prescriptions on File Prior to Visit  Medication Sig Dispense Refill  . aspirin 81 MG tablet Take 81 mg by mouth daily.    . Cholecalciferol (EQL VITAMIN D3) 2000 units CAPS Take 1 capsule by mouth daily.    . Coenzyme Q10 (COQ10) 50 MG CAPS Take 1 capsule by mouth daily.    . diazepam (VALIUM) 5 MG tablet TAKE ONE TABLET EVERY 12 HOURS IF NEEDED FOR ANXIETY 30 tablet 3  . docusate sodium (COLACE) 100 MG capsule Take 100 mg by mouth 2 (two) times daily.    . famotidine (PEPCID) 10 MG tablet Take 10 mg by mouth as needed for heartburn or indigestion.    . Glucosamine-Chondroitin (COSAMIN DS) 500-400 MG CAPS Take 1-2 capsules by mouth daily.    . Lactobacillus-Inulin (CULTURELLE DIGESTIVE HEALTH) CAPS Take 1 capsule by mouth daily. 30 capsule 0  . Lactulose 20 GM/30ML SOLN 30 ml every 4 hours until constipation is relieved 236 mL 3  . metoprolol succinate (TOPROL-XL) 25 MG 24 hr tablet TAKE 1 TABLET EVERY DAY 90 tablet 2  . PARoxetine (PAXIL) 10 MG tablet Take 1 tablet (10 mg total) by mouth daily. 30 tablet 2  . simvastatin (ZOCOR) 10 MG  tablet TAKE 1 TABLET AT BEDTIME 90 tablet 3   No current facility-administered medications on file prior to visit.     Social History  Substance Use Topics  . Smoking status: Former Smoker    Quit date: 03/29/1986  . Smokeless tobacco: Never Used  . Alcohol use No    Review of Systems  Constitutional: Negative for chills and fever.  HENT: Positive for congestion, rhinorrhea and sore throat. Negative for ear pain and sinus pressure.   Respiratory: Positive for cough. Negative for shortness of breath and wheezing.   Cardiovascular: Negative for chest pain and palpitations.  Gastrointestinal: Negative for nausea and vomiting.  Neurological: Negative for headaches.      Objective:    BP 116/80   Pulse 76   Temp 97.9 F (36.6 C) (Oral)   Wt 130 lb 12.8 oz (59.3 kg)   SpO2 98%   BMI 21.77 kg/m    Physical Exam  Constitutional: She appears well-developed and well-nourished.  HENT:  Head: Normocephalic and atraumatic.  Right Ear: Hearing, tympanic membrane, external ear and ear canal normal. No drainage, swelling or tenderness. No foreign bodies. Tympanic membrane is not erythematous and not bulging. No middle ear effusion. No decreased hearing is noted.  Left Ear: Hearing, tympanic membrane, external ear and  ear canal normal. No drainage, swelling or tenderness. No foreign bodies. Tympanic membrane is not erythematous and not bulging.  No middle ear effusion. No decreased hearing is noted.  Nose: Nose normal. No rhinorrhea. Right sinus exhibits no maxillary sinus tenderness and no frontal sinus tenderness. Left sinus exhibits no maxillary sinus tenderness and no frontal sinus tenderness.  Mouth/Throat: Uvula is midline, oropharynx is clear and moist and mucous membranes are normal. No oropharyngeal exudate, posterior oropharyngeal edema, posterior oropharyngeal erythema or tonsillar abscesses.  Eyes: Conjunctivae are normal.  Cardiovascular: Regular rhythm, normal heart sounds and  normal pulses.   Pulmonary/Chest: Effort normal and breath sounds normal. She has no wheezes. She has no rhonchi. She has no rales.  Lymphadenopathy:       Head (right side): No submental, no submandibular, no tonsillar, no preauricular, no posterior auricular and no occipital adenopathy present.       Head (left side): No submental, no submandibular, no tonsillar, no preauricular, no posterior auricular and no occipital adenopathy present.    She has no cervical adenopathy.  Neurological: She is alert.  Skin: Skin is warm and dry.  Psychiatric: She has a normal mood and affect. Her speech is normal and behavior is normal. Thought content normal.  Vitals reviewed.      Assessment & Plan:   1. Cough Working diagnoses of viral bronchitis. Afebrile. SaO2 98%. Pending chest x-ray to ensure no underlying bacterial infection. Return precautions given.  - DG Chest 2 View - benzonatate (TESSALON) 100 MG capsule; Take 1 capsule (100 mg total) by mouth 3 (three) times daily as needed for cough.  Dispense: 20 capsule; Refill: 1    I am having Ms. Belote start on benzonatate. I am also having her maintain her CULTURELLE DIGESTIVE HEALTH, famotidine, CoQ10, aspirin, Lactulose, metoprolol succinate, simvastatin, diazepam, Cholecalciferol, docusate sodium, Glucosamine-Chondroitin, and PARoxetine.   Meds ordered this encounter  Medications  . benzonatate (TESSALON) 100 MG capsule    Sig: Take 1 capsule (100 mg total) by mouth 3 (three) times daily as needed for cough.    Dispense:  20 capsule    Refill:  1    Order Specific Question:   Supervising Provider    Answer:   Sherlene Shams [[id number]]    Return precautions given.   Risks, benefits, and alternatives of the medications and treatment plan prescribed today were discussed, and patient expressed understanding.   Education regarding symptom management and diagnosis given to patient on AVS.  Continue to follow with TULLO, Mar Daring, MD for  routine health maintenance.   Neoma Laming Cutter and I agreed with plan.   Rennie Plowman, FNP

## 2016-03-27 NOTE — Telephone Encounter (Signed)
Patient notified and pharmacy notified  

## 2016-03-27 NOTE — Telephone Encounter (Signed)
The risk of tendon rupture is very rare with levaquin and unlikely to happen unless a patient is doing strenuous workouts.  I question why Alicia Moran discussed this particular side effect with patient.  I will send an alternative for treatment of pneumonia.  Doxycycline 100 mg twice daily  For 7 days .  In the future I would be more cautious about describing rare side effects to anxious patients.

## 2016-03-27 NOTE — Addendum Note (Signed)
Addended by: Frutoso Chase A on: 03/27/2016 02:00 PM   Modules accepted: Orders

## 2016-03-27 NOTE — Progress Notes (Signed)
CXR shows LLL PNA Normal Crt.    Former smoker.  Please call patient and let her know I consulted with Tullo-  With history of cdiff after antibiotics, we would like her to take Flagyl with her Levaquin. She will need to buy yogurt and ensure that she eats at least 2 servings daily with probiotics.   A rare side effect of levaquin is tendon rupture- please stop medication and let us know if she any muscle pain.   Also repeat cxr ordered in system to ensure resolve; she can go ahead and make appt for this.   Make f/u appt for patient as well.

## 2016-03-27 NOTE — Telephone Encounter (Signed)
Patient is scared to take levaquin due to side effect s described to patient causing tendonitis or rupture of tendon. Patient is scared to take medication would like to know if there is another ABX she can take instead of the levaquin

## 2016-03-27 NOTE — Patient Instructions (Signed)
We will call you with results of chest x-ray. Medication for cough prescribed. Please let us know if you are not better I  Increase intake of clear fluids. Congestion is best treated by hydration, when mucus is wetter, it is thinner, less sticky, and easier to expel from the body, either through coughing up drainage, or by blowing your nose.   Get plenty of rest.   Use saline nasal drops and blow your nose frequently. Run a humidifier at night and elevate the head of the bed. Vicks Vapor rub will help with congestion and cough. Steam showers and sinus massage for congestion.   Use Acetaminophen or Ibuprofen as needed for fever or pain. Avoid second hand smoke. Even the smallest exposure will worsen symptoms.   Over the counter medications you can try include Delsym for cough, a decongestant for congestion, and Mucinex or Robitussin as an expectorant. Be sure to just get the plain Mucinex or Robitussin that just has one medication (Guaifenesen). We don't recommend the combination products. Note, be sure to drink two glasses of water with each dose of Mucinex as the medication will not work well without adequate hydration.   You can also try a teaspoon of honey to see if this will help reduce cough. Throat lozenges can sometimes be beneficial as well.    This illness will typically last 7 - 10 days.   Please follow up with our clinic if you develop a fever greater than 101 F, symptoms worsen, or do not resolve in the next week.

## 2016-03-27 NOTE — Progress Notes (Signed)
Pre visit review using our clinic review tool, if applicable. No additional management support is needed unless otherwise documented below in the visit note. 

## 2016-03-29 ENCOUNTER — Telehealth: Payer: Self-pay | Admitting: *Deleted

## 2016-03-29 NOTE — Telephone Encounter (Signed)
Pt called to make a follow up appt with Dr. Derrel Nip for 3-4 weeks. Pt stated that she needs something for around 10 am because that is when she can get a ride. There are no available appts in the next few weeks. Please advise, thank you!  Call pt @ [phone]

## 2016-03-29 NOTE — Telephone Encounter (Signed)
Per patient , she was advised to return for another Xray once she finishes her medication, she started medication on 10/11 and question when she should return, and if she should see Dr. Derrel Nip when she returns.  Pt contact [phone]

## 2016-03-29 NOTE — Telephone Encounter (Signed)
Patient has been notified and will be scheduling an appointment with XRAY and Dr. Derrel Nip

## 2016-03-29 NOTE — Telephone Encounter (Signed)
Is this correct? Please advise

## 2016-03-29 NOTE — Telephone Encounter (Signed)
Advised patient to return in 3-4 weeks for chest x-ray. She may call and make an appointment radiology for this.she may also have a follow-up appointment with Dr. Derrel Nip at that time

## 2016-03-29 NOTE — Telephone Encounter (Signed)
I best that can be arranged is a 1130 on most days can you see if that can be worked out. thanks

## 2016-04-10 NOTE — Telephone Encounter (Signed)
error 

## 2016-04-16 ENCOUNTER — Ambulatory Visit (INDEPENDENT_AMBULATORY_CARE_PROVIDER_SITE_OTHER): Payer: Medicare Other | Admitting: Family Medicine

## 2016-04-16 ENCOUNTER — Encounter: Payer: Self-pay | Admitting: Family Medicine

## 2016-04-16 ENCOUNTER — Telehealth: Payer: Self-pay | Admitting: Internal Medicine

## 2016-04-16 ENCOUNTER — Ambulatory Visit (INDEPENDENT_AMBULATORY_CARE_PROVIDER_SITE_OTHER): Payer: Medicare Other

## 2016-04-16 VITALS — BP 144/78 | HR 83 | Temp 97.9°F | Wt 131.0 lb

## 2016-04-16 DIAGNOSIS — J189 Pneumonia, unspecified organism: Secondary | ICD-10-CM

## 2016-04-16 DIAGNOSIS — I70209 Unspecified atherosclerosis of native arteries of extremities, unspecified extremity: Secondary | ICD-10-CM | POA: Diagnosis not present

## 2016-04-16 DIAGNOSIS — J181 Lobar pneumonia, unspecified organism: Secondary | ICD-10-CM | POA: Diagnosis not present

## 2016-04-16 MED ORDER — GUAIFENESIN-CODEINE 100-10 MG/5ML PO SYRP: 5.0000 mL | ORAL_SOLUTION | Freq: Three times a day (TID) | ORAL | 0 refills | Status: DC | PRN

## 2016-04-16 NOTE — Assessment & Plan Note (Signed)
Patient with recent left lower lobe pneumonia. She has completed her course of antibiotic. She states that she's having recurrence of cough. She is concerned about recurrence of pneumonia. Exam unremarkable. Treating cough with Cheratussin. Obtaining x-ray for re-evaluation.

## 2016-04-16 NOTE — Progress Notes (Signed)
Subjective:  Patient ID: Alicia Moran, female    DOB: 05-14-1929  Age: 82 y.o. MRN: [id number]  CC: ? Pneumonia  HPI:  82 year old female with a complicated past medical history presents with the above concern.  She was seen earlier in October (10/10) for cough. She was diagnosed initially with viral bronchitis. Chest x-ray returned revealing atelectasis versus early infiltrate of the left lower lobe. He was placed on antibiotics for left lower lobe pneumonia. She was initially prescribed Levaquin but this was later changed to doxycycline due to patient's concern about side effects.  Patient resents today with concerns that she is having a recurrence. She states that she developed a cough yesterday. Cough is dry and nonproductive. No associated fever or chills.  No SOB.  She has been taking tessalon without much improvement. No other associated symptoms. No other complaints at this time.  Social Hx   Social History   Social History  . Marital status: Married    Spouse name: N/A  . Number of children: N/A  . Years of education: N/A   Social History Main Topics  . Smoking status: Former Smoker    Quit date: 03/29/1986  . Smokeless tobacco: Never Used  . Alcohol use No  . Drug use: No  . Sexual activity: Not Asked   Other Topics Concern  . None   Social History Narrative  . None    Review of Systems  Constitutional: Negative for chills and fever.  Respiratory: Positive for cough. Negative for shortness of breath.    Objective:  BP (!) 144/78 (BP Location: Right Arm, Patient Position: Sitting, Cuff Size: Normal)   Pulse 83   Temp 97.9 F (36.6 C) (Oral)   Wt 131 lb (59.4 kg)   SpO2 95%   BMI 21.80 kg/m   BP/Weight 04/16/2016 03/27/2016 03/19/2016  Systolic BP 144 116 128  Diastolic BP 78 80 70  Wt. (Lbs) 131 130.8 133.5  BMI 21.8 21.77 22.22   Physical Exam  Constitutional:  Frail elderly female in no acute distress.  HENT:  Mouth/Throat: Oropharynx is  clear and moist.  Cardiovascular: Normal rate and regular rhythm.   Murmur heard. Pulmonary/Chest: Effort normal. She has no wheezes. She has no rales.  Neurological: She is alert.  Psychiatric: She has a normal mood and affect.  Vitals reviewed.  Lab Results  Component Value Date   WBC 5.4 03/19/2016   HGB 10.7 (L) 03/19/2016   HCT 31.9 (L) 03/19/2016   PLT 116.0 (L) 03/19/2016   GLUCOSE 94 03/19/2016   CHOL 121 08/26/2015   TRIG 48.0 08/26/2015   HDL 52.00 08/26/2015   LDLCALC 60 08/26/2015   ALT 13 03/19/2016   AST 17 03/19/2016   NA 138 03/19/2016   K 4.2 03/19/2016   CL 103 03/19/2016   CREATININE 0.74 03/19/2016   BUN 26 (H) 03/19/2016   CO2 28 03/19/2016   TSH 2.77 08/26/2015   INR 1.0 02/23/2015   HGBA1C 5.4 12/24/2011    Assessment & Plan:   Problem List Items Addressed This Visit    Community acquired pneumonia of left lower lobe of lung (HCC) - Primary    Patient with recent left lower lobe pneumonia. She has completed her course of antibiotic. She states that she's having recurrence of cough. She is concerned about recurrence of pneumonia. Exam unremarkable. Treating cough with Cheratussin. Obtaining x-ray for re-evaluation.      Relevant Medications   guaiFENesin-codeine (ROBITUSSIN AC) 100-10 MG/5ML syrup  Other Relevant Orders   DG Chest 2 View (Completed)    Other Visit Diagnoses   None.     Meds ordered this encounter  Medications  . guaiFENesin-codeine (ROBITUSSIN AC) 100-10 MG/5ML syrup    Sig: Take 5 mLs by mouth 3 (three) times daily as needed for cough.    Dispense:  120 mL    Refill:  0    Follow-up: PRN  Everlene Other DO [facility]

## 2016-04-16 NOTE — Patient Instructions (Signed)
We will call with the xray results.  Use the cough medication as needed.  Take care  Dr. Lacinda Axon

## 2016-04-16 NOTE — Telephone Encounter (Signed)
Spoke with Dr.Cook and he stated that pts x-ray was negative. Pt was advised of results and told to hydrate along with taking medication prescribe.

## 2016-04-16 NOTE — Progress Notes (Signed)
Pre visit review using our clinic review tool, if applicable. No additional management support is needed unless otherwise documented below in the visit note. 

## 2016-04-19 ENCOUNTER — Telehealth: Payer: Self-pay | Admitting: Internal Medicine

## 2016-04-19 NOTE — Telephone Encounter (Signed)
I called pt and left vm to call office and sch AWV. Thank you!

## 2016-04-24 ENCOUNTER — Other Ambulatory Visit: Payer: Self-pay | Admitting: Internal Medicine

## 2016-05-03 ENCOUNTER — Encounter: Payer: Self-pay | Admitting: Internal Medicine

## 2016-05-03 ENCOUNTER — Ambulatory Visit (INDEPENDENT_AMBULATORY_CARE_PROVIDER_SITE_OTHER): Payer: Medicare Other | Admitting: Internal Medicine

## 2016-05-03 VITALS — BP 148/78 | HR 72 | Temp 98.0°F | Resp 12 | Ht 65.0 in | Wt 129.8 lb

## 2016-05-03 DIAGNOSIS — D696 Thrombocytopenia, unspecified: Secondary | ICD-10-CM | POA: Diagnosis not present

## 2016-05-03 DIAGNOSIS — D500 Iron deficiency anemia secondary to blood loss (chronic): Secondary | ICD-10-CM | POA: Diagnosis not present

## 2016-05-03 DIAGNOSIS — F4321 Adjustment disorder with depressed mood: Secondary | ICD-10-CM

## 2016-05-03 DIAGNOSIS — J189 Pneumonia, unspecified organism: Secondary | ICD-10-CM

## 2016-05-03 DIAGNOSIS — F4329 Adjustment disorder with other symptoms: Secondary | ICD-10-CM

## 2016-05-03 DIAGNOSIS — I70209 Unspecified atherosclerosis of native arteries of extremities, unspecified extremity: Secondary | ICD-10-CM | POA: Diagnosis not present

## 2016-05-03 DIAGNOSIS — R195 Other fecal abnormalities: Secondary | ICD-10-CM | POA: Diagnosis not present

## 2016-05-03 DIAGNOSIS — J181 Lobar pneumonia, unspecified organism: Secondary | ICD-10-CM

## 2016-05-03 DIAGNOSIS — R5383 Other fatigue: Secondary | ICD-10-CM

## 2016-05-03 DIAGNOSIS — Z634 Disappearance and death of family member: Secondary | ICD-10-CM

## 2016-05-03 MED ORDER — CYANOCOBALAMIN 1000 MCG/ML IJ SOLN
1000.0000 ug | Freq: Once | INTRAMUSCULAR | Status: AC
  Administered 2016-05-03: 1000 ug via INTRAMUSCULAR

## 2016-05-03 NOTE — Patient Instructions (Addendum)
Try add TrueLemon or TrueLime to your water for flavoring  For an all natural    When you start the Paxil. You may not need the valium as much   If the B12 injecti ngive you a boost of energy ,  We can continue giving them to you every few weeks   Referral to Hematology is in process

## 2016-05-03 NOTE — Progress Notes (Signed)
Subjective:  Patient ID: Alicia Moran, female    DOB: 07/19/1928  Age: 82 y.o. MRN: [id number]  CC: The primary encounter diagnosis was Anemia due to chronic blood loss. Diagnoses of Positive FIT (fecal immunochemical test), Thrombocytopenia (HCC), Community acquired pneumonia of left lower lobe of lung (HCC), Complicated grief, and Fatigue, unspecified type were also pertinent to this visit.  HPI Alicia Moran presents for follow up on multiple issues  1) LLL pneumonia that was  Diagnosed with CXR and treated on  Oct 10. She was seen for follow up by Dr Adriana Simas on Oct 30th  And repeat chest x ray noted resolution of infiltrate. Her cough has improved,  Is nonproductive and infrequent ,  And not accompanied by dyspnea    2) Cc:  ":I'm so tired." Discussedher borderline  low B12 level, chronic anemia. . Slight drop noted with recent labs,  Platelets dropped a little as well.  Has an elevated epo level.  Worried about her platelets,  Wants to see Hematology and postpone GI evaluation for positive Cologuard   3) Nausea ,  constipation . Gets nauseated when she drinks large amounts of water  Adding sweet n low to diet pepsi and water   4) Anxiety: Hasn't added Paxil bc concerned about side effects of mixing with other medications   5 ) Left eyelid has become  droopier but doesn't want surgery      Outpatient Medications Prior to Visit  Medication Sig Dispense Refill  . aspirin 81 MG tablet Take 81 mg by mouth daily.    . Cholecalciferol (EQL VITAMIN D3) 2000 units CAPS Take 1 capsule by mouth daily.    . Coenzyme Q10 (COQ10) 50 MG CAPS Take 1 capsule by mouth daily.    . diazepam (VALIUM) 5 MG tablet TAKE ONE TABLET EVERY 12 HOURS IF NEEDED FOR ANXIETY 30 tablet 3  . docusate sodium (COLACE) 100 MG capsule Take 100 mg by mouth 2 (two) times daily.    . famotidine (PEPCID) 10 MG tablet Take 10 mg by mouth as needed for heartburn or indigestion.    Marland Kitchen guaiFENesin-codeine (ROBITUSSIN  AC) 100-10 MG/5ML syrup Take 5 mLs by mouth 3 (three) times daily as needed for cough. 120 mL 0  . Lactobacillus-Inulin (CULTURELLE DIGESTIVE HEALTH) CAPS Take 1 capsule by mouth daily. 30 capsule 0  . Lactulose 20 GM/30ML SOLN 30 ml every 4 hours until constipation is relieved 236 mL 3  . metoprolol succinate (TOPROL-XL) 25 MG 24 hr tablet TAKE 1 TABLET EVERY DAY 90 tablet 1  . PARoxetine (PAXIL) 10 MG tablet Take 1 tablet (10 mg total) by mouth daily. 30 tablet 2  . simvastatin (ZOCOR) 10 MG tablet TAKE 1 TABLET AT BEDTIME 90 tablet 3  . Glucosamine-Chondroitin (COSAMIN DS) 500-400 MG CAPS Take 1-2 capsules by mouth daily.     No facility-administered medications prior to visit.     Review of Systems;  Patient denies headache, fevers, , skin rash, eye pain, sinus congestion and sinus pain, sore throat, dysphagia,  hemoptysis ,  dyspnea, wheezing, chest pain, palpitations, orthopnea, edema, abdominal pain, , melena, diarrhea, constipation, flank pain, dysuria, hematuria, urinary  Frequency, nocturia, numbness, tingling, seizures,  Focal weakness, Loss of consciousness,  Tremor, suicidal ideation.      Objective:  BP (!) 148/78   Pulse 72   Temp 98 F (36.7 C) (Oral)   Resp 12   Ht 5\' 5"  (1.651 m)   Wt 129 lb 12  oz (58.9 kg)   SpO2 96%   BMI 21.59 kg/m   BP Readings from Last 3 Encounters:  05/03/16 (!) 148/78  04/16/16 (!) 144/78  03/27/16 116/80    Wt Readings from Last 3 Encounters:  05/03/16 129 lb 12 oz (58.9 kg)  04/16/16 131 lb (59.4 kg)  03/27/16 130 lb 12.8 oz (59.3 kg)    General appearance: alert, cooperative and appears stated age Eye: mild ptosis of left eye.  Neck: no adenopathy, no carotid bruit, supple, symmetrical, trachea midline and thyroid not enlarged, symmetric, no tenderness/mass/nodules Back: symmetric, no curvature. ROM normal. No CVA tenderness. Lungs: clear to auscultation bilaterally Heart: regular rate and rhythm, S1, S2 normal, no murmur,  click, rub or gallop Abdomen: soft, non-tender; bowel sounds normal; no masses,  no organomegaly Pulses: 2+ and symmetric Skin: Skin color, texture, turgor normal. No rashes or lesions Lymph nodes: Cervical, supraclavicular, and axillary nodes normal. Psych: affect sad, makes good eye contact. No fidgeting,  Interactive.   Denies suicidal thoughts   Lab Results  Component Value Date   HGBA1C 5.4 12/24/2011    Lab Results  Component Value Date   CREATININE 0.74 03/19/2016   CREATININE 0.80 12/07/2015   CREATININE 0.83 10/21/2015    Lab Results  Component Value Date   WBC 5.4 03/19/2016   HGB 10.7 (L) 03/19/2016   HCT 31.9 (L) 03/19/2016   PLT 116.0 (L) 03/19/2016   GLUCOSE 94 03/19/2016   CHOL 121 08/26/2015   TRIG 48.0 08/26/2015   HDL 52.00 08/26/2015   LDLCALC 60 08/26/2015   ALT 13 03/19/2016   AST 17 03/19/2016   NA 138 03/19/2016   K 4.2 03/19/2016   CL 103 03/19/2016   CREATININE 0.74 03/19/2016   BUN 26 (H) 03/19/2016   CO2 28 03/19/2016   TSH 2.77 08/26/2015   INR 1.0 02/23/2015   HGBA1C 5.4 12/24/2011    Ct Abdomen Pelvis W Contrast  Result Date: 12/07/2015 CLINICAL DATA:  Constipation for 6 months, 7-10 pounds of weight loss since January 2017, history hyperlipidemia, stroke, atrial fibrillation, former smoker EXAM: CT ABDOMEN AND PELVIS WITH CONTRAST TECHNIQUE: Multidetector CT imaging of the abdomen and pelvis was performed using the standard protocol following bolus administration of intravenous contrast. Sagittal and coronal MPR images reconstructed from axial data set. CONTRAST:  ISOVUE-300 IOPAMIDOL (ISOVUE-300) INJECTION 61% IV. Dilute oral contrast. COMPARISON:  Bony CT pelvis 08/12/2008 FINDINGS: Lower chest:  Minimal subsegmental atelectasis at lung bases Hepatobiliary: Scattered hepatic cysts. Calcified granulomata within liver. Remainder of liver and gallbladder unremarkable. No biliary dilatation. Pancreas: Normal appearance Spleen: Normal  appearance Adrenals/Urinary Tract: Adrenal glands normal appearance. Cyst at upper pole RIGHT kidney 2.5 x 2.2 cm image 19. Kidneys otherwise normal appearance. No renal mass, hydronephrosis or ureteral dilatation. Bladder unremarkable. Stomach/Bowel: Appendix not identified. Stomach incompletely distended with suboptimal assessment of wall thickness. Bowel loops normal appearance. Vascular/Lymphatic: Scattered atherosclerotic calcifications aorta, iliac arteries, femoral arteries, and coronary arteries. No adenopathy. Reproductive: Atrophic uterus and ovaries. Other: No free air, free fluid, mass or hernia. Musculoskeletal: Bones diffusely demineralized. Orthopedic hardware within proximal RIGHT femur. IMPRESSION: Hepatic and RIGHT renal cysts. No acute intra-abdominal or intrapelvic abnormalities. Aortic atherosclerosis. Electronically Signed   By: Ulyses Southward M.D.   On: 12/07/2015 13:02    Assessment & Plan:   Problem List Items Addressed This Visit    Positive FIT (fecal immunochemical test)    With iron deficiency anemia concurrent.  Discussed potential use of video colonoscopy  rather than colonoscopy given her age and morbidities.  She will decide after seeing hematology      Relevant Orders   Ambulatory referral to Hematology   Complicated grief    Encouraged to start Paxil       Community acquired pneumonia of left lower lobe of lung (HCC)    Resolved clinically       Thrombocytopenia (HCC)    With anemia , referral to Hematology for evaluation.  Lab Results  Component Value Date   WBC 5.4 03/19/2016   HGB 10.7 (L) 03/19/2016   HCT 31.9 (L) 03/19/2016   MCV 98.0 03/19/2016   PLT 116.0 (L) 03/19/2016         Relevant Orders   Ambulatory referral to Hematology   Fatigue    Multifactorial likely including grief, anxiety and recent pneumonia.  B12 level was borderline low.  B12 IM injection given today.  If helpful ,  Will continue.       Anemia due to chronic blood loss -  Primary   Relevant Medications   cyanocobalamin ((VITAMIN B-12)) injection 1,000 mcg (Completed)   Other Relevant Orders   Ambulatory referral to Hematology     A total of 25 minutes of face to face time was spent with patient more than half of which was spent in counselling about the above mentioned conditions  and coordination of care   I am having Ms. Cellucci maintain her CULTURELLE DIGESTIVE HEALTH, famotidine, CoQ10, aspirin, Lactulose, simvastatin, diazepam, Cholecalciferol, docusate sodium, Glucosamine-Chondroitin, PARoxetine, guaiFENesin-codeine, and metoprolol succinate. We administered cyanocobalamin.  Meds ordered this encounter  Medications  . cyanocobalamin ((VITAMIN B-12)) injection 1,000 mcg    There are no discontinued medications.  Follow-up: No Follow-up on file.   Sherlene Shams, MD

## 2016-05-03 NOTE — Progress Notes (Signed)
Pre-visit discussion using our clinic review tool. No additional management support is needed unless otherwise documented below in the visit note.  

## 2016-05-05 DIAGNOSIS — R5383 Other fatigue: Secondary | ICD-10-CM | POA: Insufficient documentation

## 2016-05-05 NOTE — Assessment & Plan Note (Signed)
Resolved clinically 

## 2016-05-05 NOTE — Assessment & Plan Note (Signed)
With anemia , referral to Hematology for evaluation.  Lab Results  Component Value Date   WBC 5.4 03/19/2016   HGB 10.7 (L) 03/19/2016   HCT 31.9 (L) 03/19/2016   MCV 98.0 03/19/2016   PLT 116.0 (L) 03/19/2016

## 2016-05-05 NOTE — Assessment & Plan Note (Signed)
Encouraged to start Paxil

## 2016-05-05 NOTE — Assessment & Plan Note (Signed)
With iron deficiency anemia concurrent.  Discussed potential use of video colonoscopy rather than colonoscopy given her age and morbidities.  She will decide after seeing hematology

## 2016-05-05 NOTE — Assessment & Plan Note (Signed)
Multifactorial likely including grief, anxiety and recent pneumonia.  B12 level was borderline low.  B12 IM injection given today.  If helpful ,  Will continue.

## 2016-05-22 ENCOUNTER — Ambulatory Visit: Payer: Medicare Other | Admitting: Oncology

## 2016-05-29 NOTE — Progress Notes (Signed)
[facility]  Telephone:(336) [phone] Fax:(336) [phone]  ID: Alicia Moran OB: 02-22-1929  MR#: [id number]  UYQ#:[id number]  Patient Care Team: Sherlene Shams, [name] (Internal Medicine)  CHIEF COMPLAINT: Thrombocytopenia, Iron deficiency anemia, MGUS.  INTERVAL HISTORY: Patient is an 82 year old female who was found to have persistently low hemoglobin and platelet count. She currently feels well and is asymptomatic. She reports heme positive stools. She does not complain of weakness or fatigue. She has no neurologic complaints. She denies any recent fevers or illnesses. She has a good appetite and denies weight loss. She has no chest pain or shortness of breath. She denies any nausea, vomiting, constipation, or diarrhea. She has no melena or hematochezia. She has no urinary complaints. Patient feels at her baseline and offers no specific complaints today.  REVIEW OF SYSTEMS:   Review of Systems  Constitutional: Negative.  Negative for fever, malaise/fatigue and weight loss.  Respiratory: Negative.  Negative for cough and shortness of breath.   Cardiovascular: Negative.  Negative for chest pain and leg swelling.  Gastrointestinal: Positive for blood in stool. Negative for abdominal pain and melena.  Genitourinary: Negative.   Musculoskeletal: Negative.   Neurological: Negative.  Negative for weakness.  Endo/Heme/Allergies: Does not bruise/bleed easily.  Psychiatric/Behavioral: Negative.  The patient is not nervous/anxious.     As per HPI. Otherwise, a complete review of systems is negative.  PAST MEDICAL HISTORY: Past Medical History:  Diagnosis Date  . Atrial fibrillation (HCC)   . Hyperlipidemia   . Migraine headache   . Stroke [facility])     PAST SURGICAL HISTORY: No past surgical history on file.  FAMILY HISTORY: Family History  Problem Relation Age of Onset  . Hypertension Mother   . Diabetes Mother   . Hypertension Father      ADVANCED DIRECTIVES (Y/N):  N  HEALTH MAINTENANCE: Social History  Substance Use Topics  . Smoking status: Former Smoker    Quit date: 03/29/1986  . Smokeless tobacco: Never Used  . Alcohol use No     Colonoscopy:  PAP:  Bone density:  Lipid panel:  Allergies  Allergen Reactions  . Amoxicillin   . Erythromycin     Current Outpatient Prescriptions  Medication Sig Dispense Refill  . aspirin 81 MG tablet Take 81 mg by mouth daily.    . Cholecalciferol (EQL VITAMIN D3) 2000 units CAPS Take 1 capsule by mouth daily.    . Coenzyme Q10 (COQ10) 50 MG CAPS Take 1 capsule by mouth daily.    . diazepam (VALIUM) 5 MG tablet TAKE ONE TABLET EVERY 12 HOURS IF NEEDED FOR ANXIETY 30 tablet 3  . docusate sodium (COLACE) 100 MG capsule Take 100 mg by mouth 2 (two) times daily.    . famotidine (PEPCID) 10 MG tablet Take 10 mg by mouth as needed for heartburn or indigestion.    Marland Kitchen glucosamine-chondroitin 500-400 MG tablet Take 1-2 tablets by mouth daily.    . Lactobacillus-Inulin (CULTURELLE DIGESTIVE HEALTH) CAPS Take 1 capsule by mouth daily. 30 capsule 0  . Lactulose 20 GM/30ML SOLN 30 ml every 4 hours until constipation is relieved 236 mL 3  . metoprolol succinate (TOPROL-XL) 25 MG 24 hr tablet TAKE 1 TABLET EVERY DAY 90 tablet 1  . simvastatin (ZOCOR) 10 MG tablet TAKE 1 TABLET AT BEDTIME 90 tablet 3   No current facility-administered medications for this visit.     OBJECTIVE: Vitals:   05/30/16 1003  BP: (!) 176/87  Pulse: 70  Resp: 18  Temp: 98.1 F (36.7 C)     Body mass index is 21.76 kg/m.    ECOG FS:0 - Asymptomatic  General: Well-developed, well-nourished, no acute distress. Eyes: Pink conjunctiva, anicteric sclera. HEENT: Normocephalic, moist mucous membranes, clear oropharnyx. Lungs: Clear to auscultation bilaterally. Heart: Regular rate and rhythm. No rubs, murmurs, or gallops. Abdomen: Soft, nontender, nondistended. No organomegaly noted, normoactive bowel  sounds. Musculoskeletal: No edema, cyanosis, or clubbing. Neuro: Alert, answering all questions appropriately. Cranial nerves grossly intact. Skin: No rashes or petechiae noted. Psych: Normal affect. Lymphatics: No cervical, calvicular, axillary or inguinal LAD.   LAB RESULTS:  Lab Results  Component Value Date   NA 138 03/19/2016   K 4.2 03/19/2016   CL 103 03/19/2016   CO2 28 03/19/2016   GLUCOSE 94 03/19/2016   BUN 26 (H) 03/19/2016   CREATININE 0.74 03/19/2016   CALCIUM 9.2 03/19/2016   PROT 7.2 03/19/2016   ALBUMIN 4.1 03/19/2016   AST 17 03/19/2016   ALT 13 03/19/2016   ALKPHOS 74 03/19/2016   BILITOT 0.5 03/19/2016   GFRNONAA 51 (L) 06/10/2014   GFRAA >60 06/10/2014    Lab Results  Component Value Date   WBC 5.2 05/30/2016   NEUTROABS 2.1 05/30/2016   HGB 10.5 (L) 05/30/2016   HCT 30.8 (L) 05/30/2016   MCV 97.5 05/30/2016   PLT 106 (L) 05/30/2016   Lab Results  Component Value Date   IRON 47 05/30/2016   TIBC 295 05/30/2016   IRONPCTSAT 16 05/30/2016   Lab Results  Component Value Date   FERRITIN 96 05/30/2016   Lab Results  Component Value Date   TOTALPROTELP 6.6 05/30/2016   ALBUMINELP 3.8 05/30/2016   A1GS 0.2 05/30/2016   A2GS 0.6 05/30/2016   BETS 0.7 05/30/2016   GAMS 1.4 05/30/2016   MSPIKE 1.0 (H) 05/30/2016   SPEI Comment 05/30/2016   Lab Results  Component Value Date   CEA 1.9 05/30/2016     STUDIES: No results found.  ASSESSMENT: Thrombocytopenia, Iron deficiency anemia, MGUS  PLAN:    1. MGUS: Patient was found to have a mildly elevated M spike of 1.0. This is likely clinically insignificant, but will repeat in 3 months for further evaluation. If patient develops multiple myeloma treatment would be difficult given her advanced age. 2. Thrombocytopenia: Mild. Other than patient's positive M spike, the remainder of her laboratory work is either negative or within normal limits. No intervention is needed at this time. Patient  does not require bone marrow biopsy. 3. Iron deficiency anemia: Patient's hemoglobin is mild, but relatively unchanged. Iron stores are within normal limits. The remainder of her laboratory work including hemolysis labs is also within normal limits. No intervention is needed at this time. Return to clinic in 3 months with repeat laboratory work and further evaluation. 4. Heme positive stools: Patient states she is not interested in colonoscopy at this time. CEA is within normal limits as above.   Patient expressed understanding and was in agreement with this plan. She also understands that She can call clinic at any time with any questions, concerns, or complaints.    [name], MD   06/02/2016 10:11 AM

## 2016-05-30 ENCOUNTER — Encounter (INDEPENDENT_AMBULATORY_CARE_PROVIDER_SITE_OTHER): Payer: Self-pay

## 2016-05-30 ENCOUNTER — Ambulatory Visit: Payer: Medicare Other

## 2016-05-30 ENCOUNTER — Inpatient Hospital Stay: Payer: Medicare Other | Attending: Oncology | Admitting: Oncology

## 2016-05-30 VITALS — BP 176/87 | HR 70 | Temp 98.1°F | Resp 18 | Wt 130.8 lb

## 2016-05-30 DIAGNOSIS — I4891 Unspecified atrial fibrillation: Secondary | ICD-10-CM | POA: Diagnosis not present

## 2016-05-30 DIAGNOSIS — Z7982 Long term (current) use of aspirin: Secondary | ICD-10-CM | POA: Diagnosis not present

## 2016-05-30 DIAGNOSIS — E785 Hyperlipidemia, unspecified: Secondary | ICD-10-CM | POA: Insufficient documentation

## 2016-05-30 DIAGNOSIS — Z8673 Personal history of transient ischemic attack (TIA), and cerebral infarction without residual deficits: Secondary | ICD-10-CM

## 2016-05-30 DIAGNOSIS — Z87891 Personal history of nicotine dependence: Secondary | ICD-10-CM | POA: Insufficient documentation

## 2016-05-30 DIAGNOSIS — Z8669 Personal history of other diseases of the nervous system and sense organs: Secondary | ICD-10-CM | POA: Diagnosis not present

## 2016-05-30 DIAGNOSIS — Z1211 Encounter for screening for malignant neoplasm of colon: Secondary | ICD-10-CM | POA: Diagnosis not present

## 2016-05-30 DIAGNOSIS — D500 Iron deficiency anemia secondary to blood loss (chronic): Secondary | ICD-10-CM

## 2016-05-30 DIAGNOSIS — K921 Melena: Secondary | ICD-10-CM | POA: Diagnosis not present

## 2016-05-30 DIAGNOSIS — Z79899 Other long term (current) drug therapy: Secondary | ICD-10-CM | POA: Diagnosis not present

## 2016-05-30 DIAGNOSIS — D696 Thrombocytopenia, unspecified: Secondary | ICD-10-CM | POA: Insufficient documentation

## 2016-05-30 DIAGNOSIS — D509 Iron deficiency anemia, unspecified: Secondary | ICD-10-CM | POA: Insufficient documentation

## 2016-05-30 DIAGNOSIS — D472 Monoclonal gammopathy: Secondary | ICD-10-CM

## 2016-05-30 LAB — CBC WITH DIFFERENTIAL/PLATELET
BASOS PCT: 1 %
Basophils Absolute: 0 10*3/uL (ref 0–0.1)
EOS ABS: 0 10*3/uL (ref 0–0.7)
EOS PCT: 1 %
HCT: 30.8 % — ABNORMAL LOW (ref 35.0–47.0)
HEMOGLOBIN: 10.5 g/dL — AB (ref 12.0–16.0)
LYMPHS ABS: 2.8 10*3/uL (ref 1.0–3.6)
Lymphocytes Relative: 53 %
MCH: 33.1 pg (ref 26.0–34.0)
MCHC: 33.9 g/dL (ref 32.0–36.0)
MCV: 97.5 fL (ref 80.0–100.0)
MONOS PCT: 5 %
Monocytes Absolute: 0.3 10*3/uL (ref 0.2–0.9)
NEUTROS PCT: 40 %
Neutro Abs: 2.1 10*3/uL (ref 1.4–6.5)
PLATELETS: 106 10*3/uL — AB (ref 150–440)
RBC: 3.16 MIL/uL — ABNORMAL LOW (ref 3.80–5.20)
RDW: 14 % (ref 11.5–14.5)
WBC: 5.2 10*3/uL (ref 3.6–11.0)

## 2016-05-30 LAB — LACTATE DEHYDROGENASE: LDH: 137 U/L (ref 98–192)

## 2016-05-30 LAB — IRON AND TIBC
IRON: 47 ug/dL (ref 28–170)
SATURATION RATIOS: 16 % (ref 10.4–31.8)
TIBC: 295 ug/dL (ref 250–450)
UIBC: 248 ug/dL

## 2016-05-30 LAB — VITAMIN B12: VITAMIN B 12: 362 pg/mL (ref 180–914)

## 2016-05-30 LAB — FERRITIN: FERRITIN: 96 ng/mL (ref 11–307)

## 2016-05-30 LAB — FOLATE: Folate: 28 ng/mL (ref >5.9)

## 2016-05-30 NOTE — Progress Notes (Signed)
New evaluation for anemia, thrombocytopenia. Offers no complaints.

## 2016-05-31 LAB — HAPTOGLOBIN: HAPTOGLOBIN: 113 mg/dL (ref 34–200)

## 2016-05-31 LAB — CEA: CEA: 1.9 ng/mL (ref 0.0–4.7)

## 2016-06-01 LAB — PROTEIN ELECTROPHORESIS, SERUM
A/G RATIO SPE: 1.4 (ref 0.7–1.7)
Albumin ELP: 3.8 g/dL (ref 2.9–4.4)
Alpha-1-Globulin: 0.2 g/dL (ref 0.0–0.4)
Alpha-2-Globulin: 0.6 g/dL (ref 0.4–1.0)
BETA GLOBULIN: 0.7 g/dL (ref 0.7–1.3)
GAMMA GLOBULIN: 1.4 g/dL (ref 0.4–1.8)
Globulin, Total: 2.8 g/dL (ref 2.2–3.9)
M-Spike, %: 1 g/dL — ABNORMAL HIGH
Total Protein ELP: 6.6 g/dL (ref 6.0–8.5)

## 2016-06-08 LAB — DIRECT PLATELET ANTIBODY: Plt Assoc. Anti-IIB/IIIA: UNDETERMINED

## 2016-06-22 ENCOUNTER — Ambulatory Visit: Payer: Medicare Other | Admitting: Internal Medicine

## 2016-06-27 ENCOUNTER — Telehealth: Payer: Self-pay | Admitting: Internal Medicine

## 2016-06-27 NOTE — Telephone Encounter (Signed)
Pt declined having the AWV done. Thank you!

## 2016-07-09 ENCOUNTER — Ambulatory Visit: Payer: Medicare Other

## 2016-07-19 ENCOUNTER — Ambulatory Visit: Payer: Medicare Other | Admitting: Oncology

## 2016-07-19 ENCOUNTER — Other Ambulatory Visit: Payer: Medicare Other

## 2016-07-26 ENCOUNTER — Telehealth: Payer: Self-pay | Admitting: *Deleted

## 2016-07-26 ENCOUNTER — Ambulatory Visit: Payer: Medicare Other | Admitting: Internal Medicine

## 2016-07-26 NOTE — Telephone Encounter (Signed)
If they think she needs an antibiotic, they should prescribe it.  Patient should take a double dose of probiotic for 3 weeks .

## 2016-07-26 NOTE — Telephone Encounter (Signed)
Olivia Mackie from [facility] has requested consent to prescribe pt Augmentin for preseptal cellulitis, pt has a history of C-diff and cot tolerate antibiotics.  Contact Olivia Mackie [phone]

## 2016-07-27 NOTE — Telephone Encounter (Signed)
Alicia Moran informed of below.

## 2016-08-30 ENCOUNTER — Other Ambulatory Visit: Payer: Medicare Other

## 2016-09-05 ENCOUNTER — Other Ambulatory Visit: Payer: Self-pay | Admitting: Internal Medicine

## 2016-09-06 ENCOUNTER — Ambulatory Visit: Payer: Medicare Other | Admitting: Oncology

## 2016-09-11 ENCOUNTER — Other Ambulatory Visit: Payer: Self-pay

## 2016-09-11 ENCOUNTER — Ambulatory Visit: Payer: Medicare Other | Admitting: Oncology

## 2016-09-11 ENCOUNTER — Other Ambulatory Visit: Payer: Medicare Other

## 2016-09-11 DIAGNOSIS — D472 Monoclonal gammopathy: Secondary | ICD-10-CM | POA: Insufficient documentation

## 2016-09-11 DIAGNOSIS — D500 Iron deficiency anemia secondary to blood loss (chronic): Secondary | ICD-10-CM

## 2016-09-11 NOTE — Progress Notes (Signed)
[facility]  Telephone:(336) [phone] Fax:(336) [phone]  ID: Alicia Moran OB: [birth year]  MR#: [id number]  QIO#:[id number]  Patient Care Team: Sherlene Shams, [name] (Internal Medicine)  CHIEF COMPLAINT: Thrombocytopenia, iron deficiency anemia, MGUS.  INTERVAL HISTORY: Patient returns to clinic today for repeat laboratory work and further evaluation. She continues to feel well and is asymptomatic. She does not complain of weakness or fatigue. She has no neurologic complaints. She denies any recent fevers or illnesses. She has a good appetite and denies weight loss. She has no chest pain or shortness of breath. She denies any nausea, vomiting, constipation, or diarrhea. She has no melena or hematochezia. She has no urinary complaints. Patient feels at her baseline and offers no specific complaints today.  REVIEW OF SYSTEMS:   Review of Systems  Constitutional: Negative.  Negative for fever, malaise/fatigue and weight loss.  Respiratory: Negative.  Negative for cough and shortness of breath.   Cardiovascular: Negative.  Negative for chest pain and leg swelling.  Gastrointestinal: Negative for abdominal pain, blood in stool and melena.  Genitourinary: Negative.   Musculoskeletal: Negative.   Neurological: Negative.  Negative for weakness.  Endo/Heme/Allergies: Does not bruise/bleed easily.  Psychiatric/Behavioral: Negative.  The patient is not nervous/anxious.     As per HPI. Otherwise, a complete review of systems is negative.  PAST MEDICAL HISTORY: Past Medical History:  Diagnosis Date  . Atrial fibrillation (HCC)   . Hyperlipidemia   . Migraine headache   . Stroke [facility])     PAST SURGICAL HISTORY: History reviewed. No pertinent surgical history.  FAMILY HISTORY: Family History  Problem Relation Age of Onset  . Hypertension Mother   . Diabetes Mother   . Hypertension Father     ADVANCED DIRECTIVES (Y/N):  N  HEALTH  MAINTENANCE: Social History  Substance Use Topics  . Smoking status: Former Smoker    Quit date: 03/29/1986  . Smokeless tobacco: Never Used  . Alcohol use No     Colonoscopy:  PAP:  Bone density:  Lipid panel:  Allergies  Allergen Reactions  . Amoxicillin   . Erythromycin     Current Outpatient Prescriptions  Medication Sig Dispense Refill  . aspirin 81 MG tablet Take 81 mg by mouth daily.    . Cholecalciferol (EQL VITAMIN D3) 2000 units CAPS Take 1 capsule by mouth daily.    . Coenzyme Q10 (COQ10) 50 MG CAPS Take 1 capsule by mouth daily.    . diazepam (VALIUM) 5 MG tablet TAKE ONE TABLET EVERY 12 HOURS IF NEEDED FOR ANXIETY 30 tablet 3  . docusate sodium (COLACE) 100 MG capsule Take 100 mg by mouth 2 (two) times daily.    . famotidine (PEPCID) 10 MG tablet Take 10 mg by mouth as needed for heartburn or indigestion.    Marland Kitchen glucosamine-chondroitin 500-400 MG tablet Take 1-2 tablets by mouth daily.    . Lactobacillus-Inulin (CULTURELLE DIGESTIVE HEALTH) CAPS Take 1 capsule by mouth daily. 30 capsule 0  . lactulose (CHRONULAC) 10 GM/15ML solution TAKE EVERY FOUR HOURS UNTIL CONSTIPATION IS RELIEVED 240 mL 3  . metoprolol succinate (TOPROL-XL) 25 MG 24 hr tablet TAKE 1 TABLET EVERY DAY 90 tablet 1  . simvastatin (ZOCOR) 10 MG tablet TAKE 1 TABLET AT BEDTIME 90 tablet 3  . tobramycin-dexamethasone (TOBRADEX) ophthalmic solution     . benzonatate (TESSALON) 200 MG capsule Take 1 capsule (200 mg total) by mouth 3 (three) times daily as needed for cough. 60  capsule 1  . predniSONE (DELTASONE) 10 MG tablet 6 tablets on Day 1 , then reduce by 1 tablet daily until gone 21 tablet 0   No current facility-administered medications for this visit.     OBJECTIVE: Vitals:   09/13/16 1047  BP: (!) 183/78  Pulse: 79  Temp: 97.9 F (36.6 C)     Body mass index is 20.77 kg/m.    ECOG FS:0 - Asymptomatic  General: Well-developed, well-nourished, no acute distress. Eyes: Pink  conjunctiva, anicteric sclera. Lungs: Clear to auscultation bilaterally. Heart: Regular rate and rhythm. No rubs, murmurs, or gallops. Abdomen: Soft, nontender, nondistended. No organomegaly noted, normoactive bowel sounds. Musculoskeletal: No edema, cyanosis, or clubbing. Neuro: Alert, answering all questions appropriately. Cranial nerves grossly intact. Skin: No rashes or petechiae noted. Psych: Normal affect.   LAB RESULTS:  Lab Results  Component Value Date   NA 138 03/19/2016   K 4.2 03/19/2016   CL 103 03/19/2016   CO2 28 03/19/2016   GLUCOSE 94 03/19/2016   BUN 26 (H) 03/19/2016   CREATININE 0.74 03/19/2016   CALCIUM 9.2 03/19/2016   PROT 7.2 03/19/2016   ALBUMIN 4.1 03/19/2016   AST 17 03/19/2016   ALT 13 03/19/2016   ALKPHOS 74 03/19/2016   BILITOT 0.5 03/19/2016   GFRNONAA 51 (L) 06/10/2014   GFRAA >60 06/10/2014    Lab Results  Component Value Date   WBC 5.3 09/13/2016   NEUTROABS 3.0 09/13/2016   HGB 11.4 (L) 09/13/2016   HCT 32.6 (L) 09/13/2016   MCV 98.3 09/13/2016   PLT 134 (L) 09/13/2016   Lab Results  Component Value Date   IRON 32 09/13/2016   TIBC 286 09/13/2016   IRONPCTSAT 11 09/13/2016   Lab Results  Component Value Date   FERRITIN 102 09/13/2016   Lab Results  Component Value Date   TOTALPROTELP 6.6 05/30/2016   ALBUMINELP 3.8 05/30/2016   A1GS 0.2 05/30/2016   A2GS 0.6 05/30/2016   BETS 0.7 05/30/2016   GAMS 1.4 05/30/2016   MSPIKE 1.0 (H) 05/30/2016   SPEI Comment 05/30/2016   Lab Results  Component Value Date   CEA 1.9 05/30/2016     STUDIES: No results found.  ASSESSMENT: Thrombocytopenia, Iron deficiency anemia, MGUS  PLAN:    1. MGUS: Patient was found to have a mildly elevated M spike of 1.0.  If patient develops multiple myeloma treatment would be difficult given her advanced age. No further intervention is needed. Patient does not require a metastatic bone survey or bone marrow biopsy at this time. Return to  clinic in 6 months with repeat laboratory work and further evaluation. 2. Thrombocytopenia: Mild. Other than patient's positive M spike, the remainder of her laboratory work is either negative or within normal limits. No intervention is needed at this time. Follow-up as above. 3. Iron deficiency anemia: Patient's hemoglobin is mild, but relatively unchanged. Iron stores are within normal limits. The remainder of her laboratory work including hemolysis labs is also within normal limits. No intervention is needed at this time.  4. History of heme positive stools: Patient states she is not interested in colonoscopy at this time. CEA is within normal limits as above.   Patient expressed understanding and was in agreement with this plan. She also understands that She can call clinic at any time with any questions, concerns, or complaints.    [name], MD   09/14/2016 1:36 PM

## 2016-09-12 ENCOUNTER — Ambulatory Visit: Payer: Medicare Other | Admitting: Family Medicine

## 2016-09-13 ENCOUNTER — Telehealth: Payer: Self-pay | Admitting: Internal Medicine

## 2016-09-13 ENCOUNTER — Inpatient Hospital Stay: Payer: Medicare Other | Attending: Oncology

## 2016-09-13 ENCOUNTER — Inpatient Hospital Stay (HOSPITAL_BASED_OUTPATIENT_CLINIC_OR_DEPARTMENT_OTHER): Payer: Medicare Other | Admitting: Oncology

## 2016-09-13 ENCOUNTER — Encounter: Payer: Self-pay | Admitting: Oncology

## 2016-09-13 VITALS — BP 183/78 | HR 79 | Temp 97.9°F | Wt 124.8 lb

## 2016-09-13 DIAGNOSIS — D500 Iron deficiency anemia secondary to blood loss (chronic): Secondary | ICD-10-CM

## 2016-09-13 DIAGNOSIS — I4891 Unspecified atrial fibrillation: Secondary | ICD-10-CM | POA: Diagnosis not present

## 2016-09-13 DIAGNOSIS — E785 Hyperlipidemia, unspecified: Secondary | ICD-10-CM

## 2016-09-13 DIAGNOSIS — D472 Monoclonal gammopathy: Secondary | ICD-10-CM

## 2016-09-13 DIAGNOSIS — D509 Iron deficiency anemia, unspecified: Secondary | ICD-10-CM | POA: Diagnosis not present

## 2016-09-13 DIAGNOSIS — Z7952 Long term (current) use of systemic steroids: Secondary | ICD-10-CM | POA: Insufficient documentation

## 2016-09-13 DIAGNOSIS — Z8673 Personal history of transient ischemic attack (TIA), and cerebral infarction without residual deficits: Secondary | ICD-10-CM | POA: Diagnosis not present

## 2016-09-13 DIAGNOSIS — D696 Thrombocytopenia, unspecified: Secondary | ICD-10-CM | POA: Diagnosis present

## 2016-09-13 DIAGNOSIS — I10 Essential (primary) hypertension: Secondary | ICD-10-CM | POA: Insufficient documentation

## 2016-09-13 DIAGNOSIS — Z87891 Personal history of nicotine dependence: Secondary | ICD-10-CM

## 2016-09-13 DIAGNOSIS — Z79899 Other long term (current) drug therapy: Secondary | ICD-10-CM | POA: Diagnosis not present

## 2016-09-13 DIAGNOSIS — Z7982 Long term (current) use of aspirin: Secondary | ICD-10-CM

## 2016-09-13 LAB — IRON AND TIBC
Iron: 32 ug/dL (ref 28–170)
SATURATION RATIOS: 11 % (ref 10.4–31.8)
TIBC: 286 ug/dL (ref 250–450)
UIBC: 254 ug/dL

## 2016-09-13 LAB — CBC WITH DIFFERENTIAL/PLATELET
BASOS ABS: 0.1 10*3/uL (ref 0–0.1)
Basophils Relative: 1 %
EOS PCT: 1 %
Eosinophils Absolute: 0.1 10*3/uL (ref 0–0.7)
HEMATOCRIT: 32.6 % — AB (ref 35.0–47.0)
Hemoglobin: 11.4 g/dL — ABNORMAL LOW (ref 12.0–16.0)
LYMPHS ABS: 1.6 10*3/uL (ref 1.0–3.6)
LYMPHS PCT: 31 %
MCH: 34.3 pg — ABNORMAL HIGH (ref 26.0–34.0)
MCHC: 34.9 g/dL (ref 32.0–36.0)
MCV: 98.3 fL (ref 80.0–100.0)
MONO ABS: 0.5 10*3/uL (ref 0.2–0.9)
Monocytes Relative: 10 %
Neutro Abs: 3 10*3/uL (ref 1.4–6.5)
Neutrophils Relative %: 57 %
PLATELETS: 134 10*3/uL — AB (ref 150–440)
RBC: 3.32 MIL/uL — AB (ref 3.80–5.20)
RDW: 14.2 % (ref 11.5–14.5)
WBC: 5.3 10*3/uL (ref 3.6–11.0)

## 2016-09-13 LAB — FERRITIN: Ferritin: 102 ng/mL (ref 11–307)

## 2016-09-13 MED ORDER — PREDNISONE 10 MG PO TABS: ORAL_TABLET | ORAL | 0 refills | Status: DC

## 2016-09-13 MED ORDER — BENZONATATE 200 MG PO CAPS: 200.0000 mg | ORAL_CAPSULE | Freq: Three times a day (TID) | ORAL | 1 refills | Status: DC | PRN

## 2016-09-13 NOTE — Telephone Encounter (Signed)
Pt called c/o 99.0 temp, chest and head congestion, and productive cough with clear mucus, sore throat. No available appts this afternoon. Can we call something in. Please advise, thank you!  Call pt @ [facility], [place] [address]

## 2016-09-13 NOTE — Telephone Encounter (Signed)
Patient advised of below and verbalized an understanding .  [employer] and and advised of OTC scripts.

## 2016-09-13 NOTE — Telephone Encounter (Signed)
  Symptoms: chest congestion runny nose , no shortness of breath  head congestion , productive cough with clear mucus , sore throat,runny nose  fever 99.0  Pneumonia in October  Duration Symptoms present since Sunday  Please advise ,

## 2016-09-13 NOTE — Telephone Encounter (Signed)
Her symptoms do not suggest that she has a bacterial infection and therefore does not need to take an antibioticbecause they will not help viral infection.  She can take allegra and tylenol, add  sudafed PE  10 mg every 6 hours to manage the sinus congestion and substitute Afrin nasal spray for the evening dose to avoid insomnia. I'll call in a prednisone taper and a cough suppressant  To [facility] to use for the cough

## 2016-09-19 ENCOUNTER — Ambulatory Visit (INDEPENDENT_AMBULATORY_CARE_PROVIDER_SITE_OTHER): Payer: Medicare Other

## 2016-09-19 ENCOUNTER — Telehealth: Payer: Self-pay | Admitting: *Deleted

## 2016-09-19 ENCOUNTER — Ambulatory Visit (INDEPENDENT_AMBULATORY_CARE_PROVIDER_SITE_OTHER): Payer: Medicare Other | Admitting: Family Medicine

## 2016-09-19 ENCOUNTER — Encounter: Payer: Self-pay | Admitting: Family Medicine

## 2016-09-19 VITALS — BP 154/70 | HR 77 | Temp 97.9°F | Wt 127.1 lb

## 2016-09-19 DIAGNOSIS — R05 Cough: Secondary | ICD-10-CM

## 2016-09-19 DIAGNOSIS — J209 Acute bronchitis, unspecified: Secondary | ICD-10-CM | POA: Diagnosis not present

## 2016-09-19 MED ORDER — DOXYCYCLINE HYCLATE 100 MG PO TABS: 100.0000 mg | ORAL_TABLET | Freq: Two times a day (BID) | ORAL | 0 refills | Status: DC

## 2016-09-19 MED ORDER — GUAIFENESIN-CODEINE 100-10 MG/5ML PO SYRP: 5.0000 mL | ORAL_SOLUTION | Freq: Three times a day (TID) | ORAL | 0 refills | Status: DC | PRN

## 2016-09-19 NOTE — Telephone Encounter (Signed)
Patient requested Xray results- Pt Contact: [phone](please give her time to answer, pt is on a walker.)

## 2016-09-19 NOTE — Progress Notes (Signed)
Pre visit review using our clinic review tool, if applicable. No additional management support is needed unless otherwise documented below in the visit note. 

## 2016-09-19 NOTE — Assessment & Plan Note (Addendum)
New acute problem. Xray negative. Guaifenesin w/ codeine for cough. Given severity and persistence of symptoms treating with doxy.

## 2016-09-19 NOTE — Patient Instructions (Signed)
We will call with your results  Take care  Dr. Lacinda Axon

## 2016-09-19 NOTE — Telephone Encounter (Signed)
Pt called

## 2016-09-19 NOTE — Progress Notes (Signed)
Subjective:  Patient ID: Alicia Moran, female    DOB: [birth year]  Age: 83 y.o. MRN: [id number]  CC: Cough  HPI:  83 year old female with extensive past medical history presents with complaints of cough.  Patient reports that she's been sick since 3/25. Started with sore throat. Sore throat is now resolved. She subsequently developed severe cough and congestion. She reports associated shortness of breath. Cough is mildly productive of white sputum. No fever, highest temp has been 99.5. She was prescribed prednisone by her primary care physician and advised to use over-the-counter Allegra as well as Sudafed. She has not had any improvement. She used some leftover cough medication and noticed a specific improvement in her cough. She is concerned that she may have pneumonia. No other associated symptoms. No other complaints at this time.  Social Hx   Social History   Social History  . Marital status: Married    Spouse name: N/A  . Number of children: N/A  . Years of education: N/A   Social History Main Topics  . Smoking status: Former Smoker    Quit date: 03/29/1986  . Smokeless tobacco: Never Used  . Alcohol use No  . Drug use: No  . Sexual activity: Not Asked   Other Topics Concern  . None   Social History Narrative  . None    Review of Systems  Constitutional: Positive for fatigue.  Respiratory: Positive for cough and shortness of breath.    Objective:  BP (!) 154/70   Pulse 77   Temp 97.9 F (36.6 C) (Oral)   Wt 127 lb 2 oz (57.7 kg)   SpO2 96%   BMI 21.15 kg/m   BP/Weight 09/19/2016 09/13/2016 05/30/2016  Systolic BP 154 183 176  Diastolic BP 70 78 87  Wt. (Lbs) 127.13 124.8 130.78  BMI 21.15 20.77 21.76   Physical Exam  Constitutional: She appears well-developed. No distress.  Cardiovascular: Normal rate and regular rhythm.   Murmur heard. Pulmonary/Chest: Effort normal.  No adventitious sounds appreciated.  Neurological: She is alert.    Psychiatric: She has a normal mood and affect.  Vitals reviewed.   Lab Results  Component Value Date   WBC 5.3 09/13/2016   HGB 11.4 (L) 09/13/2016   HCT 32.6 (L) 09/13/2016   PLT 134 (L) 09/13/2016   GLUCOSE 94 03/19/2016   CHOL 121 08/26/2015   TRIG 48.0 08/26/2015   HDL 52.00 08/26/2015   LDLCALC 60 08/26/2015   ALT 13 03/19/2016   AST 17 03/19/2016   NA 138 03/19/2016   K 4.2 03/19/2016   CL 103 03/19/2016   CREATININE 0.74 03/19/2016   BUN 26 (H) 03/19/2016   CO2 28 03/19/2016   TSH 2.77 08/26/2015   INR 1.0 02/23/2015   HGBA1C 5.4 12/24/2011    Assessment & Plan:   Problem List Items Addressed This Visit    Acute bronchitis - Primary    New acute problem. Xray negative. Guaifenesin w/ codeine for cough. Given severity and persistence of symptoms treating with doxy.      Relevant Orders   DG Chest 2 View (Completed)      Meds ordered this encounter  Medications  . guaiFENesin-codeine (ROBITUSSIN AC) 100-10 MG/5ML syrup    Sig: Take 5 mLs by mouth 3 (three) times daily as needed for cough.    Dispense:  120 mL    Refill:  0  . doxycycline (VIBRA-TABS) 100 MG tablet    Sig: Take 1 tablet (100 mg  total) by mouth 2 (two) times daily.    Dispense:  14 tablet    Refill:  0   Follow-up: PRN  Everlene Other DO [facility]

## 2016-10-04 ENCOUNTER — Encounter: Payer: Self-pay | Admitting: Internal Medicine

## 2016-10-04 ENCOUNTER — Ambulatory Visit (INDEPENDENT_AMBULATORY_CARE_PROVIDER_SITE_OTHER): Payer: Medicare Other | Admitting: Internal Medicine

## 2016-10-04 VITALS — BP 138/82 | HR 74 | Temp 97.7°F | Resp 16 | Wt 127.5 lb

## 2016-10-04 DIAGNOSIS — Z634 Disappearance and death of family member: Secondary | ICD-10-CM

## 2016-10-04 DIAGNOSIS — E559 Vitamin D deficiency, unspecified: Secondary | ICD-10-CM

## 2016-10-04 DIAGNOSIS — J209 Acute bronchitis, unspecified: Secondary | ICD-10-CM | POA: Diagnosis not present

## 2016-10-04 DIAGNOSIS — F4329 Adjustment disorder with other symptoms: Secondary | ICD-10-CM

## 2016-10-04 DIAGNOSIS — I10 Essential (primary) hypertension: Secondary | ICD-10-CM | POA: Diagnosis not present

## 2016-10-04 DIAGNOSIS — F4321 Adjustment disorder with depressed mood: Secondary | ICD-10-CM

## 2016-10-04 DIAGNOSIS — D472 Monoclonal gammopathy: Secondary | ICD-10-CM | POA: Diagnosis not present

## 2016-10-04 DIAGNOSIS — I70209 Unspecified atherosclerosis of native arteries of extremities, unspecified extremity: Secondary | ICD-10-CM | POA: Diagnosis not present

## 2016-10-04 LAB — COMPREHENSIVE METABOLIC PANEL
ALBUMIN: 4.1 g/dL (ref 3.5–5.2)
ALT: 14 U/L (ref 0–35)
AST: 19 U/L (ref 0–37)
Alkaline Phosphatase: 60 U/L (ref 39–117)
BUN: 25 mg/dL — ABNORMAL HIGH (ref 6–23)
CALCIUM: 9.7 mg/dL (ref 8.4–10.5)
CHLORIDE: 100 meq/L (ref 96–112)
CO2: 29 mEq/L (ref 19–32)
CREATININE: 0.84 mg/dL (ref 0.40–1.20)
GFR: 68.38 mL/min (ref >60.00)
Glucose, Bld: 100 mg/dL — ABNORMAL HIGH (ref 70–99)
POTASSIUM: 4.4 meq/L (ref 3.5–5.1)
Sodium: 134 mEq/L — ABNORMAL LOW (ref 135–145)
Total Bilirubin: 0.7 mg/dL (ref 0.2–1.2)
Total Protein: 7.1 g/dL (ref 6.0–8.3)

## 2016-10-04 LAB — VITAMIN D 25 HYDROXY (VIT D DEFICIENCY, FRACTURES): VITD: 45.57 ng/mL (ref 30.00–100.00)

## 2016-10-04 MED ORDER — ALPRAZOLAM 0.25 MG PO TABS: 0.1250 mg | ORAL_TABLET | Freq: Two times a day (BID) | ORAL | 2 refills | Status: AC | PRN

## 2016-10-04 MED ORDER — ALPRAZOLAM 0.25 MG PO TABS: 0.1250 mg | ORAL_TABLET | Freq: Two times a day (BID) | ORAL | 2 refills | Status: DC | PRN

## 2016-10-04 NOTE — Patient Instructions (Addendum)
The ShingRx vaccine  IS ADVISED for all interested adults over 50 to prevent shingles  It requires two shots about a month apart.  Call jackie at [facility] to see if they have it     I will refill your alprazolam at the lowest dose 0.25 mg .  You can use 1/2 tablet up to twice daily IF NEEDED     I will see you again in 6 months to review the labs that dr Grayland Ormond ordered.  Get them done here before your visit with me

## 2016-10-04 NOTE — Progress Notes (Signed)
Subjective:  Patient ID: Alicia Moran, female    DOB: [birth year]  Age: 83 y.o. MRN: [id number]  CC: The primary encounter diagnosis was Vitamin D deficiency. Diagnoses of Hypertension, unspecified type, Monoclonal gammopathy of unknown significance (MGUS), Acute bronchitis, unspecified organism, Atherosclerotic peripheral vascular disease (HCC), and Complicated grief were also pertinent to this visit.  HPI Alicia Moran presents for follow up on hypertension, arial fib,  Mild Anemia  With normal iron studies.  Treated for acute bronchtis 2 weeks ago  Still has a cough   home bps 105 to 137 systolic   Wants shingrx  Given here.   Daughter is miserable on dialysis, on oxygen for COPD and has osteoporosis,  with chronic neck and back pain ,  Constant.  Worried about her dismal prognosis.  , tearful today.  Spent 25 minutes discussing her fears and anxiety  Doesn't want to see finnegan any more  If there is no treatment available due to her age,  But is willing to try.  need labs for MGUS and thrombocytopenia needs follow up labs in sept  ,  Not a candidate for treatment if she converst to [facility] .     Left eyelid drooping , seeing Dr Hedda Slade  Needs  cmet     Lab Results  Component Value Date   IRON 32 09/13/2016   TIBC 286 09/13/2016   FERRITIN 102 09/13/2016   Lab Results  Component Value Date   WBC 5.3 09/13/2016   HGB 11.4 (L) 09/13/2016   HCT 32.6 (L) 09/13/2016   MCV 98.3 09/13/2016   PLT 134 (L) 09/13/2016     Outpatient Medications Prior to Visit  Medication Sig Dispense Refill  . aspirin 81 MG tablet Take 81 mg by mouth daily.    . Cholecalciferol (EQL VITAMIN D3) 2000 units CAPS Take 1 capsule by mouth daily.    . Coenzyme Q10 (COQ10) 50 MG CAPS Take 1 capsule by mouth daily.    . diazepam (VALIUM) 5 MG tablet TAKE ONE TABLET EVERY 12 HOURS IF NEEDED FOR ANXIETY 30 tablet 3  . docusate sodium (COLACE) 100 MG capsule Take 100 mg by mouth 2 (two) times daily.     Marland Kitchen doxycycline (VIBRA-TABS) 100 MG tablet Take 1 tablet (100 mg total) by mouth 2 (two) times daily. (Patient not taking: Reported on 10/04/2016) 14 tablet 0  . famotidine (PEPCID) 10 MG tablet Take 10 mg by mouth as needed for heartburn or indigestion.    Marland Kitchen glucosamine-chondroitin 500-400 MG tablet Take 1-2 tablets by mouth daily.    Marland Kitchen guaiFENesin-codeine (ROBITUSSIN AC) 100-10 MG/5ML syrup Take 5 mLs by mouth 3 (three) times daily as needed for cough. (Patient not taking: Reported on 10/04/2016) 120 mL 0  . Lactobacillus-Inulin (CULTURELLE DIGESTIVE HEALTH) CAPS Take 1 capsule by mouth daily. 30 capsule 0  . lactulose (CHRONULAC) 10 GM/15ML solution TAKE EVERY FOUR HOURS UNTIL CONSTIPATION IS RELIEVED 240 mL 3  . metoprolol succinate (TOPROL-XL) 25 MG 24 hr tablet TAKE 1 TABLET EVERY DAY 90 tablet 1  . simvastatin (ZOCOR) 10 MG tablet TAKE 1 TABLET AT BEDTIME 90 tablet 3  . tobramycin-dexamethasone (TOBRADEX) ophthalmic solution      No facility-administered medications prior to visit.     Review of Systems;  Patient denies headache, fevers, malaise, unintentional weight loss, skin rash, eye pain, sinus congestion and sinus pain, sore throat, dysphagia,  hemoptysis , cough, dyspnea, wheezing, chest pain, palpitations, orthopnea, edema, abdominal pain, nausea, melena,  diarrhea, constipation, flank pain, dysuria, hematuria, urinary  Frequency, nocturia, numbness, tingling, seizures,  Focal weakness, Loss of consciousness,  Tremor, insomnia, depression, anxiety, and suicidal ideation.      Objective:  BP 138/82 (BP Location: Left Arm, Patient Position: Sitting, Cuff Size: Normal)   Pulse 74   Temp 97.7 F (36.5 C) (Oral)   Resp 16   Wt 127 lb 8 oz (57.8 kg)   SpO2 98%   BMI 21.22 kg/m   BP Readings from Last 3 Encounters:  10/04/16 138/82  09/19/16 (!) 154/70  09/13/16 (!) 183/78    Wt Readings from Last 3 Encounters:  10/04/16 127 lb 8 oz (57.8 kg)  09/19/16 127 lb 2 oz  (57.7 kg)  09/13/16 124 lb 12.8 oz (56.6 kg)    General appearance: alert, cooperative and appears stated age Ears: normal TM's and external ear canals both ears Throat: lips, mucosa, and tongue normal; teeth and gums normal Neck: no adenopathy, no carotid bruit, supple, symmetrical, trachea midline and thyroid not enlarged, symmetric, no tenderness/mass/nodules Back: symmetric, no curvature. ROM normal. No CVA tenderness. Lungs: clear to auscultation bilaterally Heart: regular rate and rhythm, S1, S2 normal, no murmur, click, rub or gallop Abdomen: soft, non-tender; bowel sounds normal; no masses,  no organomegaly Pulses: 2+ and symmetric Skin: Skin color, texture, turgor normal. No rashes or lesions Lymph nodes: Cervical, supraclavicular, and axillary nodes normal.  Lab Results  Component Value Date   HGBA1C 5.4 12/24/2011    Lab Results  Component Value Date   CREATININE 0.84 10/04/2016   CREATININE 0.74 03/19/2016   CREATININE 0.80 12/07/2015    Lab Results  Component Value Date   WBC 5.3 09/13/2016   HGB 11.4 (L) 09/13/2016   HCT 32.6 (L) 09/13/2016   PLT 134 (L) 09/13/2016   GLUCOSE 100 (H) 10/04/2016   CHOL 121 08/26/2015   TRIG 48.0 08/26/2015   HDL 52.00 08/26/2015   LDLCALC 60 08/26/2015   ALT 14 10/04/2016   AST 19 10/04/2016   NA 134 (L) 10/04/2016   K 4.4 10/04/2016   CL 100 10/04/2016   CREATININE 0.84 10/04/2016   BUN 25 (H) 10/04/2016   CO2 29 10/04/2016   TSH 2.77 08/26/2015   INR 1.0 02/23/2015   HGBA1C 5.4 12/24/2011    Ct Abdomen Pelvis W Contrast  Result Date: 12/07/2015 CLINICAL DATA:  Constipation for 6 months, 7-10 pounds of weight loss since January 2017, history hyperlipidemia, stroke, atrial fibrillation, former smoker EXAM: CT ABDOMEN AND PELVIS WITH CONTRAST TECHNIQUE: Multidetector CT imaging of the abdomen and pelvis was performed using the standard protocol following bolus administration of intravenous contrast. Sagittal and  coronal MPR images reconstructed from axial data set. CONTRAST:  ISOVUE-300 IOPAMIDOL (ISOVUE-300) INJECTION 61% IV. Dilute oral contrast. COMPARISON:  Bony CT pelvis 08/12/2008 FINDINGS: Lower chest:  Minimal subsegmental atelectasis at lung bases Hepatobiliary: Scattered hepatic cysts. Calcified granulomata within liver. Remainder of liver and gallbladder unremarkable. No biliary dilatation. Pancreas: Normal appearance Spleen: Normal appearance Adrenals/Urinary Tract: Adrenal glands normal appearance. Cyst at upper pole RIGHT kidney 2.5 x 2.2 cm image 19. Kidneys otherwise normal appearance. No renal mass, hydronephrosis or ureteral dilatation. Bladder unremarkable. Stomach/Bowel: Appendix not identified. Stomach incompletely distended with suboptimal assessment of wall thickness. Bowel loops normal appearance. Vascular/Lymphatic: Scattered atherosclerotic calcifications aorta, iliac arteries, femoral arteries, and coronary arteries. No adenopathy. Reproductive: Atrophic uterus and ovaries. Other: No free air, free fluid, mass or hernia. Musculoskeletal: Bones diffusely demineralized. Orthopedic hardware within proximal  RIGHT femur. IMPRESSION: Hepatic and RIGHT renal cysts. No acute intra-abdominal or intrapelvic abnormalities. Aortic atherosclerosis. Electronically Signed   By: Ulyses Southward M.D.   On: 12/07/2015 13:02    Assessment & Plan:   Problem List Items Addressed This Visit    Acute bronchitis    Illness resolved,  Bu cough has persisted.       Atherosclerotic peripheral vascular disease (HCC)    She has aortic atherosclerosis , asymptomatic , and recent evaluation of carotid arteries given persistent sensation of pulsations in right ear . nonsignifcant stenoses noted. Advised to continue statin and aspirin       Complicated grief    Encouraged to start Paxil but deferred.  Prn valium       HTN (hypertension)    Well controlled on current regimen. Renal function stable, no changes  today.  Lab Results  Component Value Date   CREATININE 0.84 10/04/2016   This SmartLink has not been configured with any valid records.   Lab Results  Component Value Date   CREATININE 0.84 10/04/2016         Relevant Orders   Comprehensive metabolic panel (Completed)   Monoclonal gammopathy of unknown significance (MGUS)    Continue surveillance through our office       Other Visit Diagnoses    Vitamin D deficiency    -  Primary   Relevant Orders   VITAMIN D 25 Hydroxy (Vit-D Deficiency, Fractures) (Completed)      I am having Alicia Moran maintain her CULTURELLE DIGESTIVE HEALTH, famotidine, CoQ10, aspirin, simvastatin, diazepam, Cholecalciferol, docusate sodium, metoprolol succinate, glucosamine-chondroitin, lactulose, tobramycin-dexamethasone, guaiFENesin-codeine, doxycycline, and ALPRAZolam.  Meds ordered this encounter  Medications  . DISCONTD: ALPRAZolam (XANAX) 0.25 MG tablet    Sig: Take 0.5 tablets (0.125 mg total) by mouth 2 (two) times daily as needed for anxiety or sleep.    Dispense:  30 tablet    Refill:  2  . ALPRAZolam (XANAX) 0.25 MG tablet    Sig: Take 0.5 tablets (0.125 mg total) by mouth 2 (two) times daily as needed for anxiety or sleep.    Dispense:  30 tablet    Refill:  2    Medications Discontinued During This Encounter  Medication Reason  . ALPRAZolam (XANAX) 0.25 MG tablet Reorder  . ALPRAZolam (XANAX) 0.25 MG tablet Reorder    Follow-up: Return in about 6 months (around 04/05/2017) for needs to have the future labs prior to visit .   Sherlene Shams, MD

## 2016-10-04 NOTE — Progress Notes (Signed)
Pre visit review using our clinic review tool, if applicable. No additional management support is needed unless otherwise documented below in the visit note. 

## 2016-10-06 NOTE — Assessment & Plan Note (Signed)
She has aortic atherosclerosis , asymptomatic , and recent evaluation of carotid arteries given persistent sensation of pulsations in right ear . nonsignifcant stenoses noted. Advised to continue statin and aspirin

## 2016-10-06 NOTE — Assessment & Plan Note (Signed)
Well controlled on current regimen. Renal function stable, no changes today.  Lab Results  Component Value Date   CREATININE 0.84 10/04/2016   This SmartLink has not been configured with any valid records.   Lab Results  Component Value Date   CREATININE 0.84 10/04/2016

## 2016-10-06 NOTE — Assessment & Plan Note (Signed)
Continue surveillance through our office

## 2016-10-06 NOTE — Assessment & Plan Note (Signed)
Illness resolved,  Bu cough has persisted.

## 2016-10-06 NOTE — Assessment & Plan Note (Signed)
Encouraged to start Paxil but deferred.  Prn valium

## 2016-11-03 ENCOUNTER — Other Ambulatory Visit: Payer: Self-pay | Admitting: Internal Medicine

## 2016-11-05 ENCOUNTER — Other Ambulatory Visit: Payer: Self-pay | Admitting: Internal Medicine

## 2016-12-27 ENCOUNTER — Ambulatory Visit (INDEPENDENT_AMBULATORY_CARE_PROVIDER_SITE_OTHER): Payer: Medicare Other | Admitting: Cardiovascular Disease

## 2016-12-27 VITALS — BP 155/81 | HR 78 | Resp 17 | Ht 63.0 in | Wt 122.0 lb

## 2016-12-27 DIAGNOSIS — I70209 Unspecified atherosclerosis of native arteries of extremities, unspecified extremity: Secondary | ICD-10-CM | POA: Diagnosis not present

## 2016-12-27 DIAGNOSIS — I10 Essential (primary) hypertension: Secondary | ICD-10-CM | POA: Diagnosis not present

## 2016-12-27 DIAGNOSIS — I340 Nonrheumatic mitral (valve) insufficiency: Secondary | ICD-10-CM

## 2016-12-27 NOTE — Patient Instructions (Signed)
Medication Instructions:  Your physician recommends that you continue on your current medications as directed. Please refer to the Current Medication list given to you today.   Labwork: none  Testing/Procedures: Your physician has requested that you have an echocardiogram in 6 months. Echocardiography is a painless test that uses sound waves to create images of your heart. It provides your doctor with information about the size and shape of your heart and how well your heart's chambers and valves are working. This procedure takes approximately one hour. There are no restrictions for this procedure.    Follow-Up: Your physician wants you to follow-up in: 6 months with Dr. Fletcher Anon.  You will receive a reminder letter in the mail two months in advance. If you don't receive a letter, please call our office to schedule the follow-up appointment.   Any Other Special Instructions Will Be Listed Below (If Applicable).     If you need a refill on your cardiac medications before your next appointment, please call your pharmacy.

## 2016-12-27 NOTE — Progress Notes (Signed)
Cardiology Office Note   Date:  12/27/2016   ID:  Alicia Moran, DOB Oct 30, 1928, MRN [id number]  PCP:  Sherlene Shams, MD  Cardiologist:   Lorine Bears, MD   Chief Complaint  Patient presents with  . Follow up    Follow up, Denies any chest pain or shortness of breath.       History of Present Illness: Alicia Moran is a 83 y.o. female who Is here today for a follow-up visit regarding mild to moderate valvular disease.  The patient has been followed in the past at [facility] cardiology for mild valvular heart disease.   Paroxysmal atrial fibrillation is mentioned in her chart. However, after extensive review of her chart from [facility], I did not find conclusive evidence of atrial fibrillation. It appears that in 2008 she suffered from a stroke in spite of being on Plavix. Thus, she was started on anticoagulation with warfarin at that time for that indication.  She suffered from subdural hematoma in 2015 after a fall and thus warfarin was discontinued at that time and was not resumed.  she had previous cardiac catheterization at [facility] in 2006 with no obstructive coronary artery disease.   she has been doing reasonably well with no recent chest pain or shortness of breath. She continues to be under stress related to illness of her daughter who is on dialysis.  Past Medical History:  Diagnosis Date  . Atrial fibrillation (HCC)   . Hyperlipidemia   . Migraine headache   . Stroke [facility])     No past surgical history on file.   Current Outpatient Prescriptions  Medication Sig Dispense Refill  . ALPRAZolam (XANAX) 0.25 MG tablet Take 0.25 mg by mouth 2 (two) times daily as needed for anxiety. Takes 1/2 tablet as needed twice a day    . aspirin 81 MG tablet Take 81 mg by mouth daily.    . Cholecalciferol (EQL VITAMIN D3) 2000 units CAPS Take 1 capsule by mouth daily.    . Coenzyme Q10 (COQ10) 50 MG CAPS Take 1 capsule by mouth daily.    Marland Kitchen docusate sodium (COLACE) 100 MG capsule Take  100 mg by mouth 2 (two) times daily.    . famotidine (PEPCID) 10 MG tablet Take 10 mg by mouth as needed for heartburn or indigestion.    Marland Kitchen glucosamine-chondroitin 500-400 MG tablet Take 1-2 tablets by mouth daily.    . Lactobacillus-Inulin (CULTURELLE DIGESTIVE HEALTH) CAPS Take 1 capsule by mouth daily. 30 capsule 0  . lactulose (CHRONULAC) 10 GM/15ML solution TAKE EVERY FOUR HOURS UNTIL CONSTIPATION IS RELIEVED 240 mL 3  . metoprolol succinate (TOPROL-XL) 25 MG 24 hr tablet TAKE 1 TABLET EVERY DAY 90 tablet 1  . simvastatin (ZOCOR) 10 MG tablet TAKE 1 TABLET AT BEDTIME 90 tablet 3  . diazepam (VALIUM) 5 MG tablet TAKE ONE TABLET EVERY 12 HOURS IF NEEDED FOR ANXIETY (Patient not taking: Reported on 12/27/2016) 30 tablet 3  . doxycycline (VIBRA-TABS) 100 MG tablet Take 1 tablet (100 mg total) by mouth 2 (two) times daily. (Patient not taking: Reported on 10/04/2016) 14 tablet 0  . guaiFENesin-codeine (ROBITUSSIN AC) 100-10 MG/5ML syrup Take 5 mLs by mouth 3 (three) times daily as needed for cough. (Patient not taking: Reported on 10/04/2016) 120 mL 0  . tobramycin-dexamethasone (TOBRADEX) ophthalmic solution      No current facility-administered medications for this visit.     Allergies:   Amoxicillin and Erythromycin    Social  History:  The patient  reports that she quit smoking about 30 years ago. She has never used smokeless tobacco. She reports that she does not drink alcohol or use drugs.   Family History:  The patient's family history includes Diabetes in her mother; Hypertension in her father and mother.    ROS:  Please see the history of present illness.   Otherwise, review of systems are positive for none.   All other systems are reviewed and negative.    PHYSICAL EXAM: VS:  BP (!) 155/81 (BP Location: Left Arm, Patient Position: Sitting, Cuff Size: Normal)   Pulse 78   Resp 17   Ht 5\' 3"  (1.6 m)   Wt 122 lb (55.3 kg)   BMI 21.61 kg/m  , BMI Body mass index is 21.61  kg/m. GEN: Well nourished, well developed, in no acute distress  HEENT: normal  Neck: no JVD, carotid bruits, or masses Cardiac: RRR; no rubs, or gallops,no edema . One out of 6 systolic murmur in the aortic area. 2/6 holosystolic murmur at the apex Respiratory:  clear to auscultation bilaterally, normal work of breathing GI: soft, nontender, nondistended, + BS MS: no deformity or atrophy  Skin: warm and dry, no rash Neuro:  Strength and sensation are intact Psych: euthymic mood, full affect   EKG:  EKG is ordered today. The ekg ordered today demonstrates normal sinus rhythm with no significant ST or T wave changes.   Recent Labs: 09/13/2016: Hemoglobin 11.4; Platelets 134 10/04/2016: ALT 14; BUN 25; Creatinine, Ser 0.84; Potassium 4.4; Sodium 134    Lipid Panel    Component Value Date/Time   CHOL 121 08/26/2015 0941   TRIG 48.0 08/26/2015 0941   HDL 52.00 08/26/2015 0941   CHOLHDL 2 08/26/2015 0941   VLDL 9.6 08/26/2015 0941   LDLCALC 60 08/26/2015 0941      Wt Readings from Last 3 Encounters:  12/27/16 122 lb (55.3 kg)  10/04/16 127 lb 8 oz (57.8 kg)  09/19/16 127 lb 2 oz (57.7 kg)        ASSESSMENT AND PLAN:  1.  Mild valvular heart disease: Most recent echocardiogram at [facility] in September 2016 showed normal LV systolic function, mild to moderate mitral regurgitation, mild aortic regurgitation and mild tricuspid regurgitation.  Requested a follow-up echocardiogram. This can be done in the next 6 months.   2. Previous history of stroke: No documented history of atrial fibrillation. Continue low-dose aspirin.   3. Essential hypertension: Blood pressure is  mildly elevated which I suspect is due to stress.     Disposition:   FU with me in 1 year  Signed,  Lorine Bears, MD  12/27/2016 11:27 AM    [facility]

## 2016-12-28 ENCOUNTER — Telehealth: Payer: Self-pay | Admitting: Internal Medicine

## 2016-12-28 DIAGNOSIS — R634 Abnormal weight loss: Secondary | ICD-10-CM

## 2016-12-28 NOTE — Telephone Encounter (Signed)
Pt called and stated that she went your cardiologist yesterday and she lost another 5 pounds and is concerned especially since Dr. Derrel Nip did not want her to lose any more weight.She states that she just does not have the appetite she once had, but she is drinking more boost.  Please advise, thank you!  Call pt @ [phone]

## 2016-12-28 NOTE — Telephone Encounter (Signed)
Reason for call: appetite decreased,  Symptoms: decreased appetite, current 122, down 5 lbs, drinking 2 -3 boost daily, saw cardiologist yesterday, feels she is under a lot of stress, feels like she is depressed maybe related to appetite, no sucidal thought, concerned about weight loss.  Wondered if you would check blood work next week.  Duration  Medications: Last seen for this problem: Seen by: Patient aware that you are out of office today.  Didn't want message sent to Dr Nicki Reaper .

## 2016-12-29 NOTE — Telephone Encounter (Signed)
Yes, labs ordered.  At her last visit in October I suggested a trial of Paxil.  Did she ever start it?

## 2016-12-31 NOTE — Telephone Encounter (Signed)
Spoke with patient she states tried the Paxil and it didn't help .   She couldn't recall how long she took it. She will call back to schedule labs once she arranges transportation.

## 2017-01-03 ENCOUNTER — Ambulatory Visit: Payer: Medicare Other

## 2017-01-04 ENCOUNTER — Other Ambulatory Visit (INDEPENDENT_AMBULATORY_CARE_PROVIDER_SITE_OTHER): Payer: Medicare Other

## 2017-01-04 DIAGNOSIS — R634 Abnormal weight loss: Secondary | ICD-10-CM

## 2017-01-04 LAB — COMPREHENSIVE METABOLIC PANEL
ALBUMIN: 4.3 g/dL (ref 3.5–5.2)
ALT: 12 U/L (ref 0–35)
AST: 17 U/L (ref 0–37)
Alkaline Phosphatase: 69 U/L (ref 39–117)
BUN: 35 mg/dL — AB (ref 6–23)
CHLORIDE: 101 meq/L (ref 96–112)
CO2: 27 meq/L (ref 19–32)
CREATININE: 0.88 mg/dL (ref 0.40–1.20)
Calcium: 9.8 mg/dL (ref 8.4–10.5)
GFR: 64.77 mL/min (ref >60.00)
GLUCOSE: 99 mg/dL (ref 70–99)
Potassium: 4.2 mEq/L (ref 3.5–5.1)
Sodium: 137 mEq/L (ref 135–145)
Total Bilirubin: 0.5 mg/dL (ref 0.2–1.2)
Total Protein: 7.1 g/dL (ref 6.0–8.3)

## 2017-01-04 LAB — PREALBUMIN: PREALBUMIN: 17 mg/dL (ref 17–34)

## 2017-01-04 LAB — TSH: TSH: 5.95 u[IU]/mL — AB (ref 0.35–4.50)

## 2017-01-17 ENCOUNTER — Ambulatory Visit (INDEPENDENT_AMBULATORY_CARE_PROVIDER_SITE_OTHER): Payer: Medicare Other | Admitting: Internal Medicine

## 2017-01-17 ENCOUNTER — Encounter: Payer: Self-pay | Admitting: Internal Medicine

## 2017-01-17 ENCOUNTER — Telehealth: Payer: Self-pay | Admitting: Internal Medicine

## 2017-01-17 VITALS — BP 144/76 | HR 73 | Temp 98.0°F | Resp 15 | Ht 63.0 in | Wt 124.0 lb

## 2017-01-17 DIAGNOSIS — I70209 Unspecified atherosclerosis of native arteries of extremities, unspecified extremity: Secondary | ICD-10-CM

## 2017-01-17 DIAGNOSIS — R5383 Other fatigue: Secondary | ICD-10-CM | POA: Diagnosis not present

## 2017-01-17 DIAGNOSIS — D696 Thrombocytopenia, unspecified: Secondary | ICD-10-CM | POA: Diagnosis not present

## 2017-01-17 DIAGNOSIS — R7989 Other specified abnormal findings of blood chemistry: Secondary | ICD-10-CM

## 2017-01-17 DIAGNOSIS — F4323 Adjustment disorder with mixed anxiety and depressed mood: Secondary | ICD-10-CM

## 2017-01-17 DIAGNOSIS — R946 Abnormal results of thyroid function studies: Secondary | ICD-10-CM

## 2017-01-17 DIAGNOSIS — D500 Iron deficiency anemia secondary to blood loss (chronic): Secondary | ICD-10-CM

## 2017-01-17 DIAGNOSIS — R300 Dysuria: Secondary | ICD-10-CM | POA: Diagnosis not present

## 2017-01-17 DIAGNOSIS — R195 Other fecal abnormalities: Secondary | ICD-10-CM | POA: Diagnosis not present

## 2017-01-17 LAB — URINALYSIS, MICROSCOPIC ONLY: RBC / HPF: NONE SEEN (ref 0–?)

## 2017-01-17 LAB — POCT URINALYSIS DIPSTICK
BILIRUBIN UA: NEGATIVE
GLUCOSE UA: NEGATIVE
Ketones, UA: NEGATIVE
NITRITE UA: NEGATIVE
RBC UA: NEGATIVE
Spec Grav, UA: 1.02 (ref 1.010–1.025)
UROBILINOGEN UA: 0.2 U/dL
pH, UA: 5.5 (ref 5.0–8.0)

## 2017-01-17 MED ORDER — DULOXETINE HCL 20 MG PO CPEP: 20.0000 mg | ORAL_CAPSULE | Freq: Every day | ORAL | 3 refills | Status: DC

## 2017-01-17 NOTE — Telephone Encounter (Signed)
No, that is not apprpriate .   Move somebody

## 2017-01-17 NOTE — Telephone Encounter (Signed)
I tried to make an appt for pt but she has a small availability as she relies on transportation. Pt can only come between 9:30 and 11. Dr. Derrel Nip does not have anything with that availability until October. Please advise, thank you!

## 2017-01-17 NOTE — Progress Notes (Signed)
Subjective:  Patient ID: Alicia Moran, female    DOB: 02/14/1929  Age: 83 y.o. MRN: [id number]  CC: The primary encounter diagnosis was Dysuria. Diagnoses of Positive fecal immunochemical test, Thrombocytopenia (HCC), Elevated TSH, Adjustment disorder with mixed anxiety and depressed mood, Fatigue, unspecified type, Iron deficiency anemia due to chronic blood loss, and Positive FIT (fecal immunochemical test) were also pertinent to this visit.  HPI [name] presents for follow up on depression, with multiple unsuccessful  trials of SSRIs and other non benzo medications due to intolerances .  She continues to endorse fatigue. Not sleeping well.  Vision and hearing are  both poor. Right ear hearing loss,  Tried hearing aid for 6 weeks last year,  Didn't do any good.  Blames it on myringotomy tube placed by Dr Andee Poles .  "hole has not healed and I have whistling through that ear.  Saw  Pillsbury at [facility]. Tympanoplasty not an option  Given cipro otic and told to RTC one month,  Did not return.  Stressors:   Daughter lives with her , daughter is on dialysis and supplemental oxygen   Dysuria, recurrent   Back pain managed by Martha Clan,  ESI deferred by patient due to fear of paralysis.   Wakes up with headaches  2-3 times per week that  Last all day .  No neck pain.  Top and back of head . Uses tylenol  Chronic issue, says she has had recurrent headaches her entire adult life.   Discussed getting a home sleep study ; she has deferred,  Attributes them to stress.      Outpatient Medications Prior to Visit  Medication Sig Dispense Refill  . ALPRAZolam (XANAX) 0.25 MG tablet Take 0.25 mg by mouth 2 (two) times daily as needed for anxiety. Takes 1/2 tablet as needed twice a day    . aspirin 81 MG tablet Take 81 mg by mouth daily.    . Cholecalciferol (EQL VITAMIN D3) 2000 units CAPS Take 1 capsule by mouth daily.    . Coenzyme Q10 (COQ10) 50 MG CAPS Take 1 capsule by mouth daily.    Marland Kitchen  docusate sodium (COLACE) 100 MG capsule Take 100 mg by mouth 2 (two) times daily.    . famotidine (PEPCID) 10 MG tablet Take 10 mg by mouth as needed for heartburn or indigestion.    Marland Kitchen glucosamine-chondroitin 500-400 MG tablet Take 1-2 tablets by mouth daily.    . Lactobacillus-Inulin (CULTURELLE DIGESTIVE HEALTH) CAPS Take 1 capsule by mouth daily. 30 capsule 0  . lactulose (CHRONULAC) 10 GM/15ML solution TAKE EVERY FOUR HOURS UNTIL CONSTIPATION IS RELIEVED 240 mL 3  . metoprolol succinate (TOPROL-XL) 25 MG 24 hr tablet TAKE 1 TABLET EVERY DAY 90 tablet 1  . simvastatin (ZOCOR) 10 MG tablet TAKE 1 TABLET AT BEDTIME 90 tablet 3  . diazepam (VALIUM) 5 MG tablet TAKE ONE TABLET EVERY 12 HOURS IF NEEDED FOR ANXIETY (Patient not taking: Reported on 12/27/2016) 30 tablet 3  . doxycycline (VIBRA-TABS) 100 MG tablet Take 1 tablet (100 mg total) by mouth 2 (two) times daily. (Patient not taking: Reported on 10/04/2016) 14 tablet 0  . guaiFENesin-codeine (ROBITUSSIN AC) 100-10 MG/5ML syrup Take 5 mLs by mouth 3 (three) times daily as needed for cough. (Patient not taking: Reported on 10/04/2016) 120 mL 0  . tobramycin-dexamethasone (TOBRADEX) ophthalmic solution      No facility-administered medications prior to visit.     Review of Systems;  Patient denies  headache, fevers, malaise, unintentional weight loss, skin rash, eye pain, sinus congestion and sinus pain, sore throat, dysphagia,  hemoptysis , cough, dyspnea, wheezing, chest pain, palpitations, orthopnea, edema, abdominal pain, nausea, melena, diarrhea, constipation, flank pain, dysuria, hematuria, urinary  Frequency, nocturia, numbness, tingling, seizures,  Focal weakness, Loss of consciousness,  Tremor, insomnia, depression, anxiety, and suicidal ideation.     Objective:  BP (!) 144/76 (BP Location: Left Arm, Patient Position: Sitting, Cuff Size: Normal)   Pulse 73   Temp 98 F (36.7 C) (Oral)   Resp 15   Ht 5\' 3"  (1.6 m)   Wt 124 lb  (56.2 kg)   SpO2 98%   BMI 21.97 kg/m   BP Readings from Last 3 Encounters:  01/17/17 (!) 144/76  12/27/16 (!) 155/81  10/04/16 138/82    Wt Readings from Last 3 Encounters:  01/17/17 124 lb (56.2 kg)  12/27/16 122 lb (55.3 kg)  10/04/16 127 lb 8 oz (57.8 kg)    General appearance: alert, cooperative and appears stated age Ears: normal TM's and external ear canals both ears Throat: lips, mucosa, and tongue normal; teeth and gums normal Neck: no adenopathy, no carotid bruit, supple, symmetrical, trachea midline and thyroid not enlarged, symmetric, no tenderness/mass/nodules Back: symmetric, no curvature. ROM normal. No CVA tenderness. Lungs: clear to auscultation bilaterally Heart: regular rate and rhythm, S1, S2 normal, no murmur, click, rub or gallop Abdomen: soft, non-tender; bowel sounds normal; no masses,  no organomegaly Pulses: 2+ and symmetric Skin: Skin color, texture, turgor normal. No rashes or lesions Lymph nodes: Cervical, supraclavicular, and axillary nodes normal.  Lab Results  Component Value Date   HGBA1C 5.4 12/24/2011    Lab Results  Component Value Date   CREATININE 0.88 01/04/2017   CREATININE 0.84 10/04/2016   CREATININE 0.74 03/19/2016    Lab Results  Component Value Date   WBC 5.3 09/13/2016   HGB 11.4 (L) 09/13/2016   HCT 32.6 (L) 09/13/2016   PLT 134 (L) 09/13/2016   GLUCOSE 99 01/04/2017   CHOL 121 08/26/2015   TRIG 48.0 08/26/2015   HDL 52.00 08/26/2015   LDLCALC 60 08/26/2015   ALT 12 01/04/2017   AST 17 01/04/2017   NA 137 01/04/2017   K 4.2 01/04/2017   CL 101 01/04/2017   CREATININE 0.88 01/04/2017   BUN 35 (H) 01/04/2017   CO2 27 01/04/2017   TSH 5.95 (H) 01/04/2017   INR 1.0 02/23/2015   HGBA1C 5.4 12/24/2011    Ct Abdomen Pelvis W Contrast  Result Date: 12/07/2015 CLINICAL DATA:  Constipation for 6 months, 7-10 pounds of weight loss since January 2017, history hyperlipidemia, stroke, atrial fibrillation, former  smoker EXAM: CT ABDOMEN AND PELVIS WITH CONTRAST TECHNIQUE: Multidetector CT imaging of the abdomen and pelvis was performed using the standard protocol following bolus administration of intravenous contrast. Sagittal and coronal MPR images reconstructed from axial data set. CONTRAST:  ISOVUE-300 IOPAMIDOL (ISOVUE-300) INJECTION 61% IV. Dilute oral contrast. COMPARISON:  Bony CT pelvis 08/12/2008 FINDINGS: Lower chest:  Minimal subsegmental atelectasis at lung bases Hepatobiliary: Scattered hepatic cysts. Calcified granulomata within liver. Remainder of liver and gallbladder unremarkable. No biliary dilatation. Pancreas: Normal appearance Spleen: Normal appearance Adrenals/Urinary Tract: Adrenal glands normal appearance. Cyst at upper pole RIGHT kidney 2.5 x 2.2 cm image 19. Kidneys otherwise normal appearance. No renal mass, hydronephrosis or ureteral dilatation. Bladder unremarkable. Stomach/Bowel: Appendix not identified. Stomach incompletely distended with suboptimal assessment of wall thickness. Bowel loops normal appearance. Vascular/Lymphatic: Scattered atherosclerotic  calcifications aorta, iliac arteries, femoral arteries, and coronary arteries. No adenopathy. Reproductive: Atrophic uterus and ovaries. Other: No free air, free fluid, mass or hernia. Musculoskeletal: Bones diffusely demineralized. Orthopedic hardware within proximal RIGHT femur. IMPRESSION: Hepatic and RIGHT renal cysts. No acute intra-abdominal or intrapelvic abnormalities. Aortic atherosclerosis. Electronically Signed   By: Ulyses Southward M.D.   On: 12/07/2015 13:02    Assessment & Plan:   Problem List Items Addressed This Visit    Thrombocytopenia (HCC)   Relevant Orders   CBC with Differential/Platelet   Positive FIT (fecal immunochemical test)    She remains concerned about the possibility of colon CA  But deferred colonoscopy last year. Will send home with FBOTS and if positive will refer to GI        Iron deficiency  anemia due to chronic blood loss    She had a positive cologuard test last year and was referred to GI but deferred colonoscopy.       Fatigue    Multifactorial likely including insomnia, chronic pain,  anxiety and likely nocturnal hypoxia.  B12 deficiency has been addressed.Marland Kitchen  TSH is slightly elevated. Will repeat in 4 weeks       Dysuria - Primary    UA normal.       Relevant Orders   POCT urinalysis dipstick (Completed)   Urine Microscopic Only (Completed)   Urine Culture (Completed)   Adjustment disorder with mixed anxiety and depressed mood    Complicated by chronic pain.  She has consented to a trial of cymbalta        Other Visit Diagnoses    Positive fecal immunochemical test       Relevant Orders   Guiac Stool Card-TAKE HOME   Fecal occult blood, imunochemical   Elevated TSH       Relevant Orders   TSH   T4, free      I have discontinued Ms. Shibuya's diazepam, tobramycin-dexamethasone, guaiFENesin-codeine, and doxycycline. I am also having her start on DULoxetine. Additionally, I am having her maintain her CULTURELLE DIGESTIVE HEALTH, famotidine, CoQ10, aspirin, Cholecalciferol, docusate sodium, glucosamine-chondroitin, lactulose, simvastatin, metoprolol succinate, and ALPRAZolam.  Meds ordered this encounter  Medications  . DULoxetine (CYMBALTA) 20 MG capsule    Sig: Take 1 capsule (20 mg total) by mouth daily. With  Or after dinner    Dispense:  30 capsule    Refill:  3    Medications Discontinued During This Encounter  Medication Reason  . tobramycin-dexamethasone (TOBRADEX) ophthalmic solution Patient has not taken in last 30 days  . guaiFENesin-codeine (ROBITUSSIN AC) 100-10 MG/5ML syrup Patient has not taken in last 30 days  . doxycycline (VIBRA-TABS) 100 MG tablet Patient has not taken in last 30 days  . diazepam (VALIUM) 5 MG tablet Patient has not taken in last 30 days    Follow-up: Return in about 4 weeks (around 02/14/2017) for labs 2 days prior  .   Sherlene Shams, MD

## 2017-01-17 NOTE — Telephone Encounter (Signed)
Called the 9:00am pt on 02/13/2017 to see if they would be willing to move their appt to 11:30am that day but did not get an answer, left a message and waiting on them to call back.

## 2017-01-17 NOTE — Telephone Encounter (Signed)
Would it be ok for the pt to come in October?

## 2017-01-17 NOTE — Patient Instructions (Addendum)
WE will repeat your thyroid and platelet tests in 4 weeks,  Before your next visit.   along with a home stool test for blood   If the stool test is positive,   We will pursue additional imaging of colon.   I want you to try Cymbalta,  In the evening after dinner   Return in one month ,  After the labs have been done

## 2017-01-19 DIAGNOSIS — R300 Dysuria: Secondary | ICD-10-CM | POA: Insufficient documentation

## 2017-01-19 LAB — URINE CULTURE

## 2017-01-19 NOTE — Assessment & Plan Note (Signed)
Multifactorial likely including insomnia, chronic pain,  anxiety and likely nocturnal hypoxia.  B12 deficiency has been addressed.Marland Kitchen  TSH is slightly elevated. Will repeat in 4 weeks

## 2017-01-19 NOTE — Assessment & Plan Note (Addendum)
UA normal. Awaiting  culture

## 2017-01-19 NOTE — Assessment & Plan Note (Signed)
Complicated by chronic pain.  She has consented to a trial of cymbalta

## 2017-01-19 NOTE — Assessment & Plan Note (Signed)
She had a positive cologuard test last year and was referred to GI but deferred colonoscopy.

## 2017-01-19 NOTE — Assessment & Plan Note (Signed)
She remains concerned about the possibility of colon CA  But deferred colonoscopy last year. Will send home with FBOTS and if positive will refer to GI

## 2017-01-21 NOTE — Telephone Encounter (Signed)
When speaking with pt about her lab results the pt stated that she would no longer be a pt of Dr. Lupita Dawn so we did not have to worry about getting her an appt scheduled. Pt would not state why she was not coming back to our office.

## 2017-01-24 ENCOUNTER — Telehealth: Payer: Self-pay | Admitting: Internal Medicine

## 2017-01-24 NOTE — Telephone Encounter (Signed)
Spoke to pt to get her scheduled for AWV.  Pt says she is not going to Dr. Derrel Nip anymore. I asked pt if she wanted me to cancel her future appts and she said yes.  Dr. Derrel Nip removed as PCP in Epic

## 2017-01-30 ENCOUNTER — Telehealth: Payer: Self-pay | Admitting: *Deleted

## 2017-01-30 NOTE — Telephone Encounter (Signed)
Pt mailed unused ifob test back to office with a note stating that she is no longer at patient at [facility] & she wanted to return the test in case she would be charged for it. I have deleted the order

## 2017-02-06 ENCOUNTER — Encounter: Payer: Self-pay | Admitting: Cardiovascular Disease

## 2017-02-15 ENCOUNTER — Other Ambulatory Visit: Payer: Medicare Other

## 2017-02-20 ENCOUNTER — Ambulatory Visit: Payer: Medicare Other | Admitting: Internal Medicine

## 2017-03-08 ENCOUNTER — Telehealth: Payer: Self-pay | Admitting: Cardiovascular Disease

## 2017-03-08 NOTE — Telephone Encounter (Signed)
Pt mailed letter inquiring if she was at additional risk for MI or stroke by receiving eye injection for macular degeneration. She has had one injection and another scheduled for 9/25. She had no side effects from the first injection. Reviewed with Dr. Fletcher Anon and called pt. She will proceed with injection #2 as scheduled. and is appreciative of the call.

## 2017-03-13 NOTE — Telephone Encounter (Signed)
Error

## 2017-03-14 ENCOUNTER — Ambulatory Visit: Payer: Medicare Other | Admitting: Oncology

## 2017-03-14 ENCOUNTER — Other Ambulatory Visit: Payer: Medicare Other

## 2017-04-11 ENCOUNTER — Ambulatory Visit: Payer: Medicare Other | Admitting: Internal Medicine

## 2017-05-24 ENCOUNTER — Telehealth: Payer: Self-pay | Admitting: Cardiovascular Disease

## 2017-05-24 DIAGNOSIS — I340 Nonrheumatic mitral (valve) insufficiency: Secondary | ICD-10-CM

## 2017-05-24 NOTE — Telephone Encounter (Signed)
Order was released by RN.

## 2017-05-24 NOTE — Telephone Encounter (Signed)
Patient scheduled over due echo but the order is no longer active  Need new order to link

## 2017-07-23 ENCOUNTER — Ambulatory Visit (INDEPENDENT_AMBULATORY_CARE_PROVIDER_SITE_OTHER): Payer: Medicare Other | Admitting: Cardiovascular Disease

## 2017-07-23 ENCOUNTER — Encounter: Payer: Self-pay | Admitting: Cardiovascular Disease

## 2017-07-23 ENCOUNTER — Ambulatory Visit (INDEPENDENT_AMBULATORY_CARE_PROVIDER_SITE_OTHER): Payer: Medicare Other

## 2017-07-23 VITALS — BP 150/60 | HR 89 | Ht 63.0 in | Wt 119.8 lb

## 2017-07-23 DIAGNOSIS — I10 Essential (primary) hypertension: Secondary | ICD-10-CM | POA: Diagnosis not present

## 2017-07-23 DIAGNOSIS — R002 Palpitations: Secondary | ICD-10-CM

## 2017-07-23 DIAGNOSIS — I38 Endocarditis, valve unspecified: Secondary | ICD-10-CM | POA: Diagnosis not present

## 2017-07-23 DIAGNOSIS — I340 Nonrheumatic mitral (valve) insufficiency: Secondary | ICD-10-CM | POA: Diagnosis not present

## 2017-07-23 MED ORDER — METOPROLOL SUCCINATE ER 50 MG PO TB24: 50.0000 mg | ORAL_TABLET | Freq: Every day | ORAL | 3 refills | Status: DC

## 2017-07-23 NOTE — Patient Instructions (Signed)
Medication Instructions:  Your physician has recommended you make the following change in your medication:  INCREASE metoprolol to 50mg  once daily   Labwork: none  Testing/Procedures: none  Follow-Up: Your physician wants you to follow-up in: 6 months with Dr. Fletcher Anon.  You will receive a reminder letter in the mail two months in advance. If you don't receive a letter, please call our office to schedule the follow-up appointment.   Any Other Special Instructions Will Be Listed Below (If Applicable).     If you need a refill on your cardiac medications before your next appointment, please call your pharmacy.

## 2017-07-23 NOTE — Progress Notes (Signed)
Cardiology Office Note   Date:  07/23/2017   ID:  Alicia Moran, DOB Dec 17, 1928, MRN [id number]  PCP:  [name], MD  Cardiologist:   Lorine Bears, MD   Chief Complaint  Patient presents with  . OTHER    6 month f/u no complaints today. Daughter passed away in 06/10/2023. Meds reviewed verbally with pt.      History of Present Illness: Alicia Moran is a 84 y.o. female who Is here today for a follow-up visit regarding mild to moderate valvular disease.  The patient was followed in the past at [facility] cardiology for mild valvular heart disease.  Paroxysmal atrial fibrillation is mentioned in her chart. However, after extensive review of her chart from [facility], I did not find conclusive evidence of atrial fibrillation. It appears that in 2008 she suffered from a stroke in spite of being on Plavix. Thus, she was started on anticoagulation with warfarin at that time for that indication.  She suffered from subdural hematoma in 2015 after a fall and thus warfarin was discontinued at that time and was not resumed.  she had previous cardiac catheterization at [facility] in 2006 with no obstructive coronary artery disease.   Unfortunately, her only daughter died in 10-Jun-2023 from cardiac arrest.  She was on dialysis for a few years.  Since then, the patient had significant depression and grief with loss of weight.  She was started on sertraline by Dr. Arlana Pouch.  She reports worsening palpitations.  No recent chest pain.  Past Medical History:  Diagnosis Date  . Atrial fibrillation (HCC)   . Hyperlipidemia   . Migraine headache   . Stroke [facility])     History reviewed. No pertinent surgical history.   Current Outpatient Medications  Medication Sig Dispense Refill  . Alicia Moran (XANAX) 0.25 MG tablet Take 0.25 mg by mouth 2 (two) times daily as needed for anxiety. Takes 1/2 tablet as needed twice a day    . aspirin 81 MG tablet Take 81 mg by mouth daily.    . Cholecalciferol (EQL VITAMIN D3) 2000  units CAPS Take 1 capsule by mouth daily.    . Coenzyme Q10 (COQ10) 50 MG CAPS Take 1 capsule by mouth daily.    Marland Kitchen docusate sodium (COLACE) 100 MG capsule Take 100 mg by mouth 2 (two) times daily.    . DULoxetine (CYMBALTA) 20 MG capsule Take 1 capsule (20 mg total) by mouth daily. With  Or after dinner 30 capsule 3  . famotidine (PEPCID) 10 MG tablet Take 10 mg by mouth as needed for heartburn or indigestion.    Marland Kitchen glucosamine-chondroitin 500-400 MG tablet Take 1-2 tablets by mouth daily.    . Lactobacillus-Inulin (CULTURELLE DIGESTIVE HEALTH) CAPS Take 1 capsule by mouth daily. 30 capsule 0  . lactulose (CHRONULAC) 10 GM/15ML solution TAKE EVERY FOUR HOURS UNTIL CONSTIPATION IS RELIEVED 240 mL 3  . metoprolol succinate (TOPROL-XL) 25 MG 24 hr tablet TAKE 1 TABLET EVERY DAY 90 tablet 1  . sertraline (ZOLOFT) 25 MG tablet Take 25 mg by mouth daily.    . simvastatin (ZOCOR) 10 MG tablet TAKE 1 TABLET AT BEDTIME 90 tablet 3   No current facility-administered medications for this visit.     Allergies:   Amoxicillin; Erythromycin; and Nsaids    Social History:  The patient  reports that she quit smoking about 31 years ago. she has never used smokeless tobacco. She reports that she does not drink alcohol or use  drugs.   Family History:  The patient's family history includes Diabetes in her mother; Hypertension in her father and mother.    ROS:  Please see the history of present illness.   Otherwise, review of systems are positive for none.   All other systems are reviewed and negative.    PHYSICAL EXAM: VS:  BP (!) 150/60 (BP Location: Left Arm, Patient Position: Sitting, Cuff Size: Normal)   Pulse 89   Ht 5\' 3"  (1.6 m)   Wt 119 lb 12 oz (54.3 kg)   BMI 21.21 kg/m  , BMI Body mass index is 21.21 kg/m. GEN: Well nourished, well developed, in no acute distress  HEENT: normal  Neck: no JVD, carotid bruits, or masses Cardiac: RRR; no rubs, or gallops,no edema . 2/6 systolic murmur in  the aortic area. 2/6 holosystolic murmur at the apex Respiratory:  clear to auscultation bilaterally, normal work of breathing GI: soft, nontender, nondistended, + BS MS: no deformity or atrophy  Skin: warm and dry, no rash Neuro:  Strength and sensation are intact Psych: euthymic mood, full affect   EKG:  EKG is ordered today. The ekg ordered today demonstrates sinus rhythm with sinus arrhythmia.   Recent Labs: 09/13/2016: Hemoglobin 11.4; Platelets 134 01/04/2017: ALT 12; BUN 35; Creatinine, Ser 0.88; Potassium 4.2; Sodium 137; TSH 5.95    Lipid Panel    Component Value Date/Time   CHOL 121 08/26/2015 0941   TRIG 48.0 08/26/2015 0941   HDL 52.00 08/26/2015 0941   CHOLHDL 2 08/26/2015 0941   VLDL 9.6 08/26/2015 0941   LDLCALC 60 08/26/2015 0941      Wt Readings from Last 3 Encounters:  07/23/17 119 lb 12 oz (54.3 kg)  01/17/17 124 lb (56.2 kg)  12/27/16 122 lb (55.3 kg)        ASSESSMENT AND PLAN:  1.  Moderate valvular heart disease: The patient had an echocardiogram done in our office today which was personally reviewed by me.  It showed normal LV systolic function with moderate mitral and tricuspid regurgitation without pulmonary hypertension.  There was mild aortic stenosis and mild aortic insufficiency.  Continue clinical follow-up.  Considering her age and relatively frail status, I doubt that she would be a good candidate for any intervention in the future even if her valvular disease worsen.  2. Previous history of stroke: No documented history of atrial fibrillation. Continue low-dose aspirin.   3. Essential hypertension: Blood pressure is elevated today.  I increase Toprol to 50 mg once daily.  4.  Worsening palpitations: Likely due to stress: I increase Toprol to 50 mg once daily.    Disposition:   FU with me in 6 months.  Signed,  Lorine Bears, MD  07/23/2017 4:01 PM    [facility]

## 2017-07-24 ENCOUNTER — Telehealth: Payer: Self-pay | Admitting: Cardiovascular Disease

## 2017-07-24 ENCOUNTER — Other Ambulatory Visit: Payer: Self-pay | Admitting: *Deleted

## 2017-07-24 MED ORDER — METOPROLOL SUCCINATE ER 50 MG PO TB24: 50.0000 mg | ORAL_TABLET | Freq: Every day | ORAL | 3 refills | Status: DC

## 2017-07-24 NOTE — Telephone Encounter (Signed)
°*  STAT* If patient is at the pharmacy, call can be transferred to refill team.   1. Which medications need to be refilled? (please list name of each medication and dose if known) Metoprolol 50 mg po q day   2. Which pharmacy/location (including street and city if local pharmacy) is medication to be sent to? [facility] mail order   3. Do they need a 30 day or 90 day supply? 21    PATIENT WAS HERE YESTERDAY AND THIS WAS SENT TO THE WRONG PHARMACY

## 2017-07-24 NOTE — Telephone Encounter (Signed)
Requested Prescriptions   Signed Prescriptions Disp Refills  . metoprolol succinate (TOPROL-XL) 50 MG 24 hr tablet 90 tablet 3    Sig: Take 1 tablet (50 mg total) by mouth daily.    Authorizing Provider: Kathlyn Sacramento A    Ordering User: Britt Bottom

## 2017-08-20 ENCOUNTER — Other Ambulatory Visit: Payer: Medicare Other

## 2017-09-26 DIAGNOSIS — M179 Osteoarthritis of knee, unspecified: Secondary | ICD-10-CM | POA: Insufficient documentation

## 2017-10-01 ENCOUNTER — Other Ambulatory Visit: Payer: Self-pay | Admitting: Unknown Physician Specialty

## 2017-10-01 DIAGNOSIS — M25562 Pain in left knee: Secondary | ICD-10-CM

## 2017-10-01 DIAGNOSIS — M79605 Pain in left leg: Secondary | ICD-10-CM

## 2017-10-11 ENCOUNTER — Ambulatory Visit
Admission: RE | Admit: 2017-10-11 | Discharge: 2017-10-11 | Disposition: A | Discharge status: Home / Self Care | Payer: Medicare Other | Source: Ambulatory Visit | Attending: Unknown Physician Specialty | Admitting: Unknown Physician Specialty

## 2017-10-11 DIAGNOSIS — M899 Disorder of bone, unspecified: Secondary | ICD-10-CM | POA: Insufficient documentation

## 2017-10-11 DIAGNOSIS — M1712 Unilateral primary osteoarthritis, left knee: Secondary | ICD-10-CM | POA: Insufficient documentation

## 2017-10-11 DIAGNOSIS — M79605 Pain in left leg: Secondary | ICD-10-CM | POA: Diagnosis present

## 2017-10-11 DIAGNOSIS — S72492A Other fracture of lower end of left femur, initial encounter for closed fracture: Secondary | ICD-10-CM | POA: Diagnosis not present

## 2017-10-11 DIAGNOSIS — S83272A Complex tear of lateral meniscus, current injury, left knee, initial encounter: Secondary | ICD-10-CM | POA: Insufficient documentation

## 2017-10-11 DIAGNOSIS — M25562 Pain in left knee: Secondary | ICD-10-CM | POA: Insufficient documentation

## 2017-10-14 ENCOUNTER — Ambulatory Visit: Payer: Medicare Other

## 2017-10-23 DIAGNOSIS — M25562 Pain in left knee: Secondary | ICD-10-CM | POA: Insufficient documentation

## 2017-10-23 DIAGNOSIS — M84352G Stress fracture, left femur, subsequent encounter for fracture with delayed healing: Secondary | ICD-10-CM | POA: Insufficient documentation

## 2017-12-15 IMAGING — CT CT ABD-PELV W/ CM
2 of 5 series · 16 of 46 positions shown, 18 images · IV contrast (iopamidol)
Comparison: Bony CT pelvis 08/12/2008

CLINICAL DATA: Constipation for 6 months, 7-10 pounds of weight
loss since June 2015, history hyperlipidemia, stroke, atrial
fibrillation, former smoker

EXAM:
CT ABDOMEN AND PELVIS WITH CONTRAST
TECHNIQUE: Multidetector CT imaging of the abdomen and pelvis was performed
using the standard protocol following bolus administration of
intravenous contrast. Sagittal and coronal MPR images reconstructed
from axial data set.
CONTRAST:  100mL VIENQN-Z11 IOPAMIDOL (VIENQN-Z11) INJECTION 61% IV.
Dilute oral contrast.

[Series 2: routine abd pel with · axial · 0.71mm/px · z∈[-802,-402]mm · 13 of 90 slices shown, 15 images]
[im 5/90  soft-tissue]
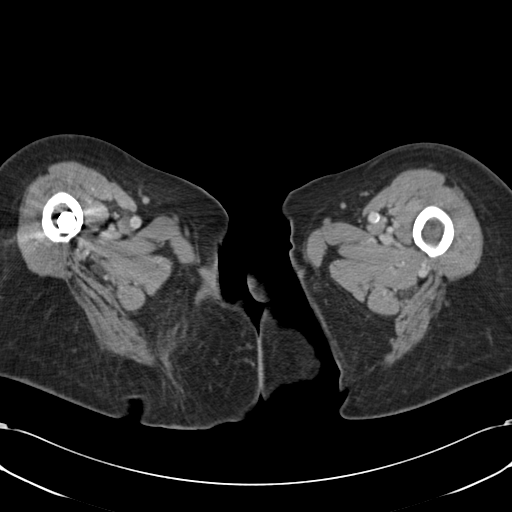
[im 5/90  bone]
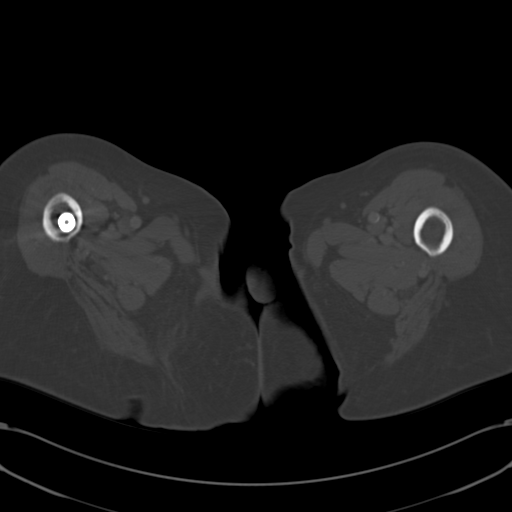
[im 14/90  soft-tissue]
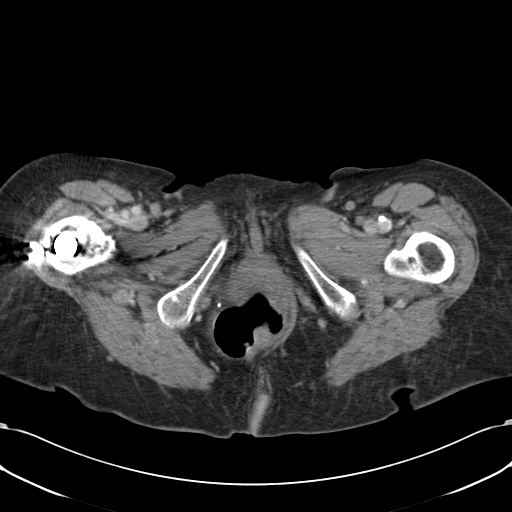
[im 18/90  soft-tissue]
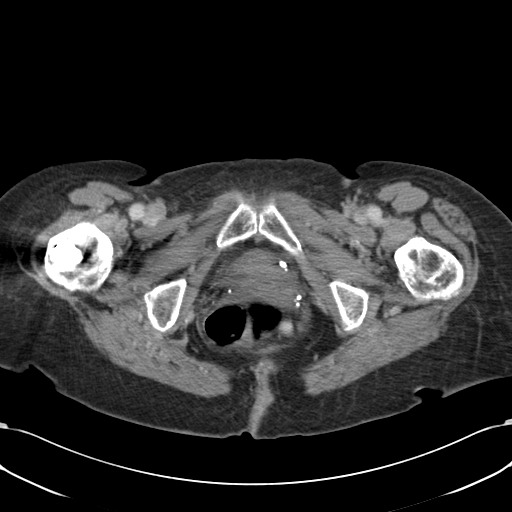
[im 27/90  soft-tissue]
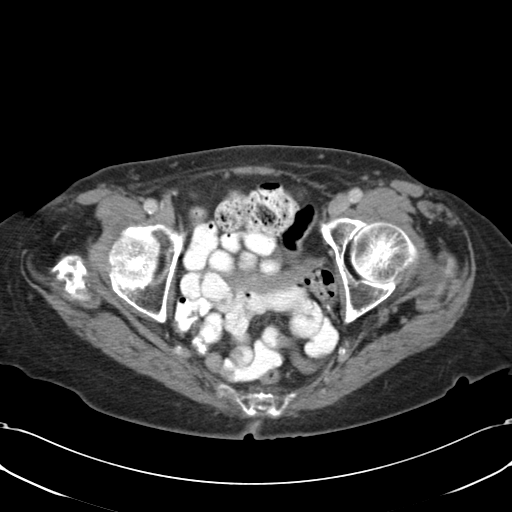
[im 32/90  soft-tissue]
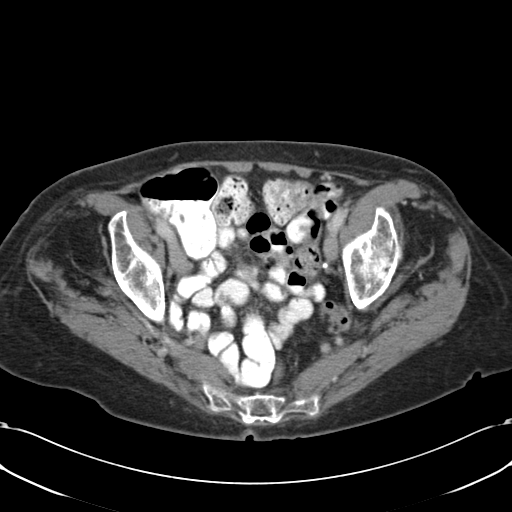
[im 41/90  soft-tissue]
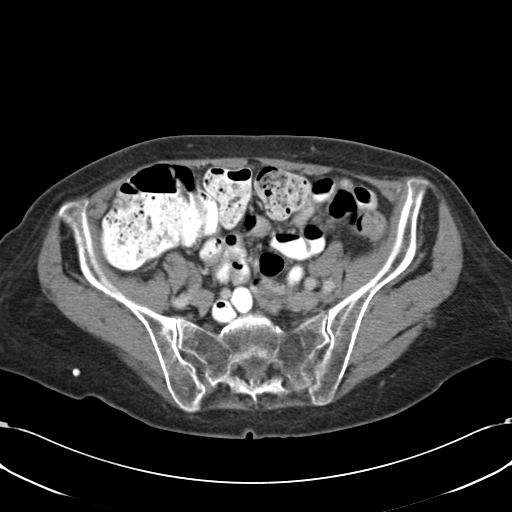
[im 45/90  soft-tissue]
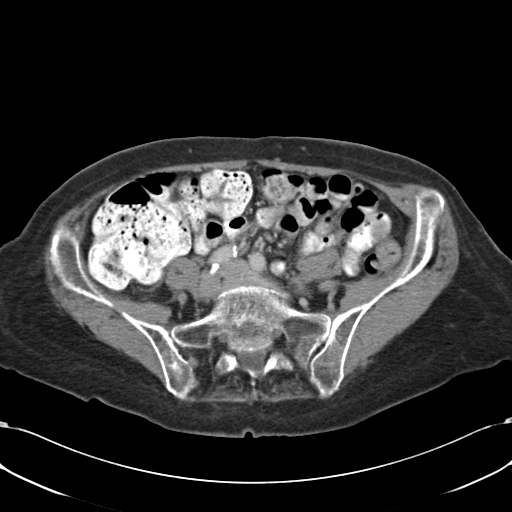
[im 49/90  soft-tissue]
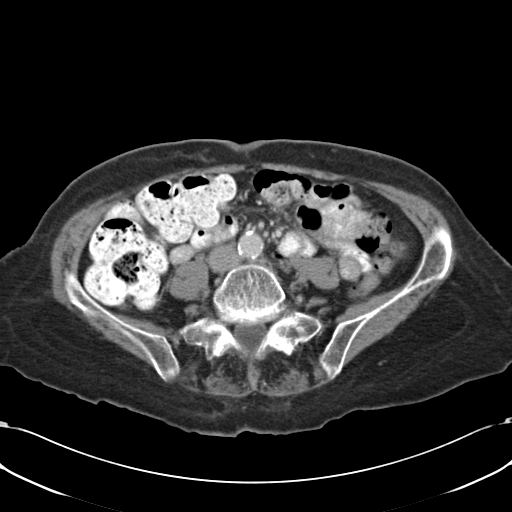
[im 58/90  soft-tissue]
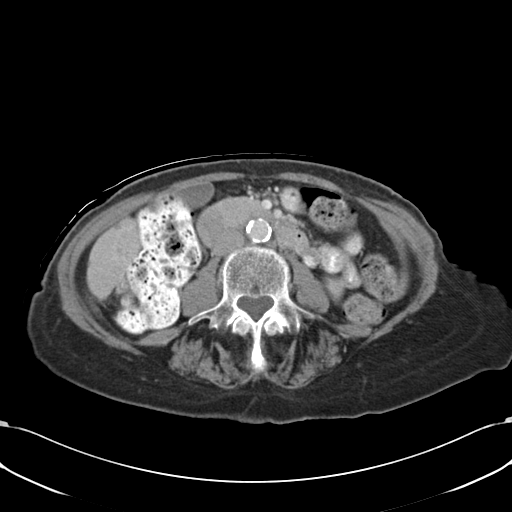
[im 58/90  bone]
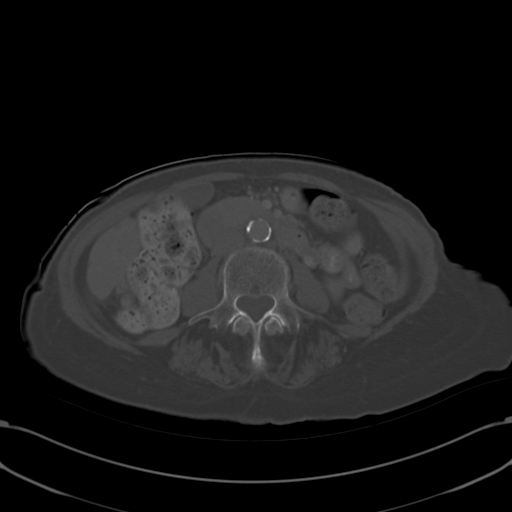
[im 63/90  soft-tissue]
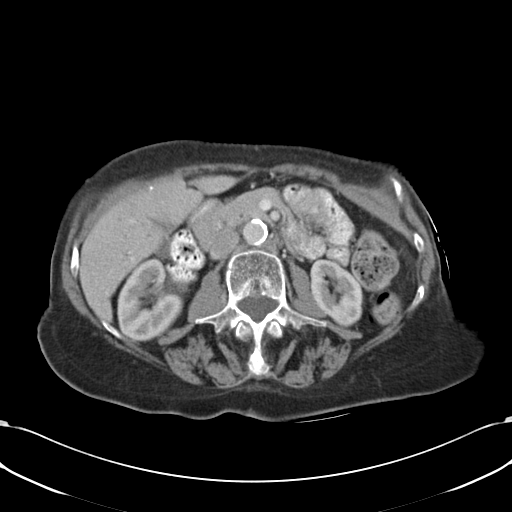
[im 72/90  soft-tissue]
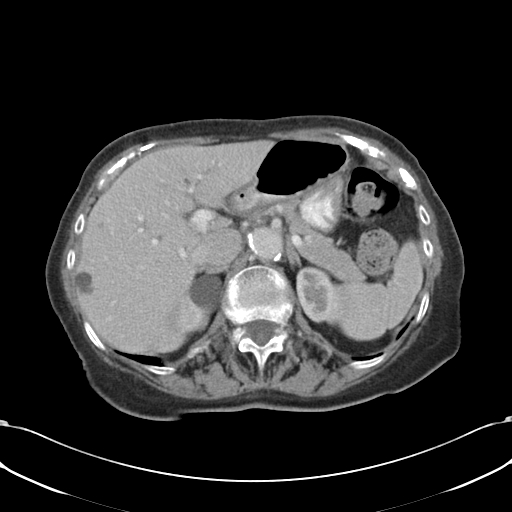
[im 76/90  soft-tissue]
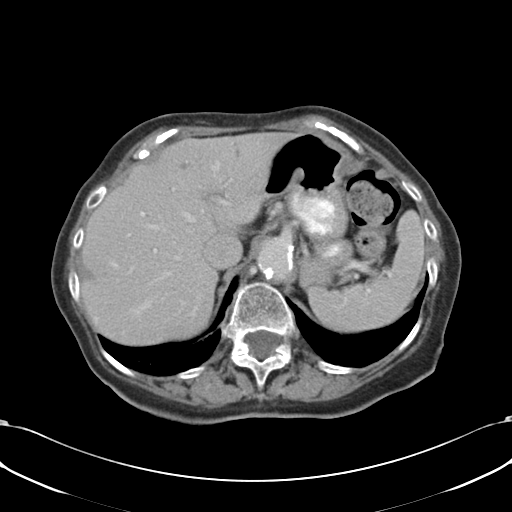
[im 85/90  soft-tissue]
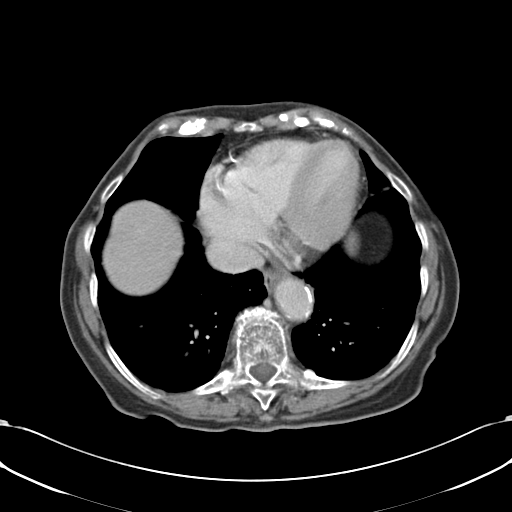

[Series 5: cor routine abd pel with · coronal · 0.70mm/px · 3 of 116 slices shown]
[im 39/116  soft-tissue]
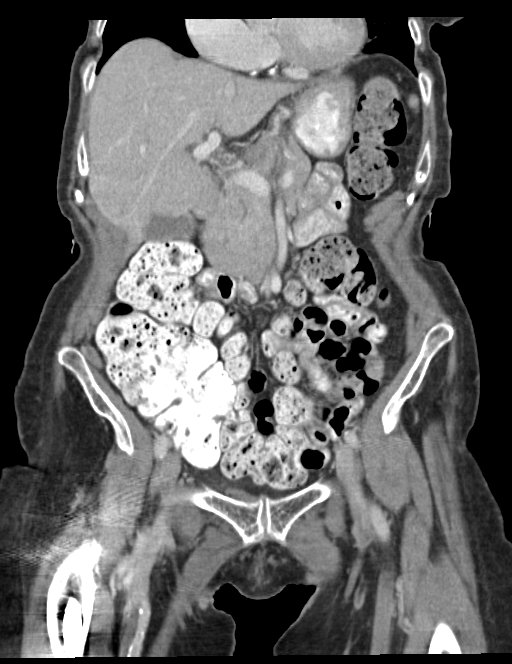
[im 52/116  soft-tissue]
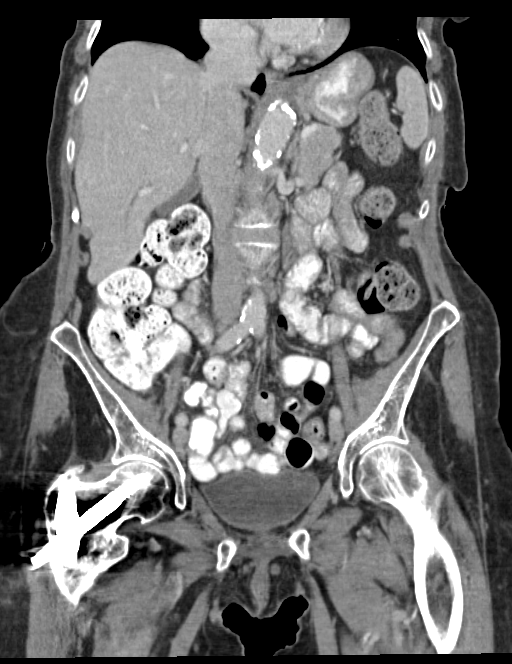
[im 64/116  soft-tissue]
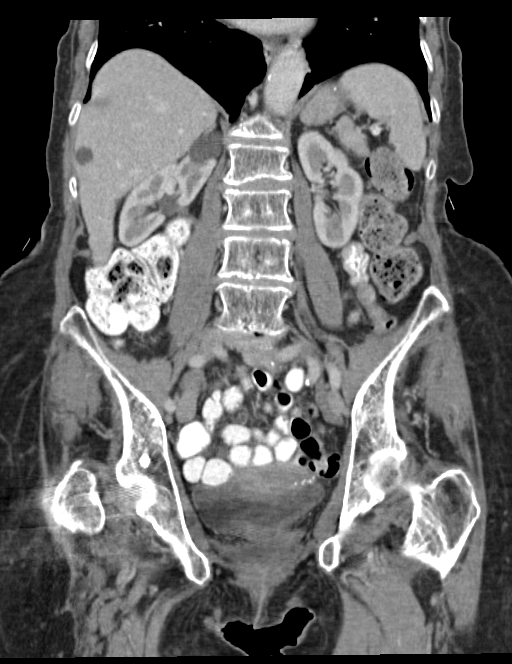

[16 of 46 positions shown; findings below may reference images not displayed]

FINDINGS: Lower chest:  Minimal subsegmental atelectasis at lung bases

Hepatobiliary: Scattered hepatic cysts. Calcified granulomata within
liver. Remainder of liver and gallbladder unremarkable. No biliary
dilatation.

Pancreas: Normal appearance

Spleen: Normal appearance

Adrenals/Urinary Tract: Adrenal glands normal appearance. Cyst at
upper pole RIGHT kidney 2.5 x 2.2 cm image 19. Kidneys otherwise
normal appearance. No renal mass, hydronephrosis or ureteral
dilatation. Bladder unremarkable.

Stomach/Bowel: Appendix not identified. Stomach incompletely
distended with suboptimal assessment of wall thickness. Bowel loops
normal appearance.

Vascular/Lymphatic: Scattered atherosclerotic calcifications aorta,
iliac arteries, femoral arteries, and coronary arteries. No
adenopathy.

Reproductive: Atrophic uterus and ovaries.

Other: No free air, free fluid, mass or hernia.

Musculoskeletal: Bones diffusely demineralized. Orthopedic hardware
within proximal RIGHT femur.
IMPRESSION: Hepatic and RIGHT renal cysts.

No acute intra-abdominal or intrapelvic abnormalities.

Aortic atherosclerosis.

## 2018-01-14 DIAGNOSIS — M84374A Stress fracture, right foot, initial encounter for fracture: Secondary | ICD-10-CM | POA: Insufficient documentation

## 2018-01-19 ENCOUNTER — Emergency Department
Admission: EM | Admit: 2018-01-19 | Discharge: 2018-01-20 | Disposition: A | Discharge status: Home / Self Care | Payer: Medicare Other | Attending: Emergency Medicine | Admitting: Emergency Medicine

## 2018-01-19 ENCOUNTER — Other Ambulatory Visit: Payer: Self-pay

## 2018-01-19 ENCOUNTER — Emergency Department: Payer: Medicare Other

## 2018-01-19 DIAGNOSIS — Z79899 Other long term (current) drug therapy: Secondary | ICD-10-CM | POA: Diagnosis not present

## 2018-01-19 DIAGNOSIS — R42 Dizziness and giddiness: Secondary | ICD-10-CM | POA: Diagnosis not present

## 2018-01-19 DIAGNOSIS — Z7982 Long term (current) use of aspirin: Secondary | ICD-10-CM | POA: Diagnosis not present

## 2018-01-19 DIAGNOSIS — Z87891 Personal history of nicotine dependence: Secondary | ICD-10-CM | POA: Insufficient documentation

## 2018-01-19 DIAGNOSIS — E7800 Pure hypercholesterolemia, unspecified: Secondary | ICD-10-CM | POA: Insufficient documentation

## 2018-01-19 DIAGNOSIS — E785 Hyperlipidemia, unspecified: Secondary | ICD-10-CM | POA: Diagnosis not present

## 2018-01-19 DIAGNOSIS — Z8673 Personal history of transient ischemic attack (TIA), and cerebral infarction without residual deficits: Secondary | ICD-10-CM | POA: Diagnosis not present

## 2018-01-19 DIAGNOSIS — I480 Paroxysmal atrial fibrillation: Secondary | ICD-10-CM | POA: Diagnosis not present

## 2018-01-19 LAB — BASIC METABOLIC PANEL
ANION GAP: 9 (ref 5–15)
BUN: 41 mg/dL — AB (ref 8–23)
CHLORIDE: 104 mmol/L (ref 98–111)
CO2: 25 mmol/L (ref 22–32)
Calcium: 9.2 mg/dL (ref 8.9–10.3)
Creatinine, Ser: 1.01 mg/dL — ABNORMAL HIGH (ref 0.44–1.00)
GFR calc Af Amer: 57 mL/min — ABNORMAL LOW (ref >60)
GFR calc non Af Amer: 49 mL/min — ABNORMAL LOW (ref >60)
GLUCOSE: 114 mg/dL — AB (ref 70–99)
POTASSIUM: 4 mmol/L (ref 3.5–5.1)
Sodium: 138 mmol/L (ref 135–145)

## 2018-01-19 LAB — CBC WITH DIFFERENTIAL/PLATELET
BASOS ABS: 0.1 10*3/uL (ref 0–0.1)
Basophils Relative: 1 %
Eosinophils Absolute: 0 10*3/uL (ref 0–0.7)
Eosinophils Relative: 1 %
HCT: 32.8 % — ABNORMAL LOW (ref 35.0–47.0)
Hemoglobin: 11.5 g/dL — ABNORMAL LOW (ref 12.0–16.0)
LYMPHS PCT: 48 %
Lymphs Abs: 3.4 10*3/uL (ref 1.0–3.6)
MCH: 34.7 pg — ABNORMAL HIGH (ref 26.0–34.0)
MCHC: 35.1 g/dL (ref 32.0–36.0)
MCV: 98.7 fL (ref 80.0–100.0)
Monocytes Absolute: 0.5 10*3/uL (ref 0.2–0.9)
Monocytes Relative: 6 %
NEUTROS ABS: 3.1 10*3/uL (ref 1.4–6.5)
Neutrophils Relative %: 44 %
PLATELETS: 131 10*3/uL — AB (ref 150–440)
RBC: 3.32 MIL/uL — AB (ref 3.80–5.20)
RDW: 13.2 % (ref 11.5–14.5)
WBC: 7 10*3/uL (ref 3.6–11.0)

## 2018-01-19 LAB — TROPONIN I: Troponin I: <0.03 ng/mL (ref <0.03)

## 2018-01-19 MED ORDER — SODIUM CHLORIDE 0.9 % IV BOLUS
500.0000 mL | Freq: Once | INTRAVENOUS | Status: AC
  Administered 2018-01-19: 500 mL via INTRAVENOUS

## 2018-01-19 MED ORDER — MECLIZINE HCL 25 MG PO TABS
12.5000 mg | ORAL_TABLET | Freq: Once | ORAL | Status: AC
  Administered 2018-01-19: 12.5 mg via ORAL
  Filled 2018-01-19: qty 1

## 2018-01-19 NOTE — ED Triage Notes (Signed)
Patient to Alicia Moran via EMS from home.  Per EMS patient reporting being dizzy for approximately 1 hour and having increased pain from stress fractures to right knee and left foot.

## 2018-01-19 NOTE — Discharge Instructions (Addendum)
It was a pleasure to take care of you today, and thank you for coming to our emergency department.  If you have any questions or concerns before leaving please ask the nurse to grab me and I'm more than happy to go through your aftercare instructions again.  If you were prescribed any opioid pain medication today such as Norco, Vicodin, Percocet, morphine, hydrocodone, or oxycodone please make sure you do not drive when you are taking this medication as it can alter your ability to drive safely.  If you have any concerns once you are home that you are not improving or are in fact getting worse before you can make it to your follow-up appointment, please do not hesitate to call 911 and come back for further evaluation.  Results for orders placed or performed during the hospital encounter of 01/19/18  CBC with Differential  Result Value Ref Range   WBC 7.0 3.6 - 11.0 K/uL   RBC 3.32 (L) 3.80 - 5.20 MIL/uL   Hemoglobin 11.5 (L) 12.0 - 16.0 g/dL   HCT 32.8 (L) 35.0 - 47.0 %   MCV 98.7 80.0 - 100.0 fL   MCH 34.7 (H) 26.0 - 34.0 pg   MCHC 35.1 32.0 - 36.0 g/dL   RDW 13.2 11.5 - 14.5 %   Platelets 131 (L) 150 - 440 K/uL   Neutrophils Relative % 44 %   Neutro Abs 3.1 1.4 - 6.5 K/uL   Lymphocytes Relative 48 %   Lymphs Abs 3.4 1.0 - 3.6 K/uL   Monocytes Relative 6 %   Monocytes Absolute 0.5 0.2 - 0.9 K/uL   Eosinophils Relative 1 %   Eosinophils Absolute 0.0 0 - 0.7 K/uL   Basophils Relative 1 %   Basophils Absolute 0.1 0 - 0.1 K/uL  Basic metabolic panel  Result Value Ref Range   Sodium 138 135 - 145 mmol/L   Potassium 4.0 3.5 - 5.1 mmol/L   Chloride 104 98 - 111 mmol/L   CO2 25 22 - 32 mmol/L   Glucose, Bld 114 (H) 70 - 99 mg/dL   BUN 41 (H) 8 - 23 mg/dL   Creatinine, Ser 1.01 (H) 0.44 - 1.00 mg/dL   Calcium 9.2 8.9 - 10.3 mg/dL   GFR calc non Af Amer 49 (L) >60 mL/min   GFR calc Af Amer 57 (L) >60 mL/min   Anion gap 9 5 - 15  Troponin I  Result Value Ref Range   Troponin I <0.03  <0.03 ng/mL  Urinalysis, Complete w Microscopic  Result Value Ref Range   Color, Urine COLORLESS (A) YELLOW   APPearance CLEAR (A) CLEAR   Specific Gravity, Urine 1.008 1.005 - 1.030   pH 6.0 5.0 - 8.0   Glucose, UA NEGATIVE NEGATIVE mg/dL   Hgb urine dipstick NEGATIVE NEGATIVE   Bilirubin Urine NEGATIVE NEGATIVE   Ketones, ur NEGATIVE NEGATIVE mg/dL   Protein, ur NEGATIVE NEGATIVE mg/dL   Nitrite NEGATIVE NEGATIVE   Leukocytes, UA TRACE (A) NEGATIVE   RBC / HPF 0-5 0 - 5 RBC/hpf   WBC, UA 6-10 0 - 5 WBC/hpf   Bacteria, UA NONE SEEN NONE SEEN   Squamous Epithelial / LPF 0-5 0 - 5   Ct Head Wo Contrast  Result Date: 01/19/2018 CLINICAL DATA:  Episode of dizziness and vertigo tonight, was afraid she was going to fall, history atrial fibrillation, stroke EXAM: CT HEAD WITHOUT CONTRAST TECHNIQUE: Contiguous axial images were obtained from the base of the skull through  the vertex without intravenous contrast. Sagittal and coronal MPR images reconstructed from axial data set. COMPARISON:  09/28/2014 FINDINGS: Brain: Generalized atrophy. Normal ventricular morphology. No midline shift or mass effect. Small vessel chronic ischemic changes of deep cerebral white matter. No intracranial hemorrhage, mass lesion, evidence of acute infarction, or extra-axial fluid collection. Vascular: Atherosclerotic calcifications of internal carotid and vertebral arteries bilaterally at skull base. No hyperdense vessels. Skull: Demineralized but intact Sinuses/Orbits: Visualized paranasal sinuses mastoid air cells clear. Other: Lid weight RIGHT orbit. IMPRESSION: Atrophy with small vessel chronic ischemic changes of deep cerebral white matter. No acute intracranial abnormalities. Electronically Signed   By: Lavonia Dana M.D.   On: 01/19/2018 22:37

## 2018-01-19 NOTE — ED Notes (Signed)
Patient transported to CT 

## 2018-01-19 NOTE — ED Provider Notes (Signed)
[facility] Emergency Department Provider Note ____________________________________________   First MD Initiated Contact with Patient 01/19/18 2131     (approximate)  I have reviewed the triage vital signs and the nursing notes.   HISTORY  Chief Complaint Dizziness  HPI Alicia Moran is a 84 y.o. female with a history of atrial fibrillation on aspirin who was presented to the emergency department vertigo.  She says it started approximately 1-1/2 to 2 hours ago and feels like a spinning sensation when she moves her head from side to side.  Says that she is a previous history of vertigo and takes 1/2 tablet of meclizine but was afraid to do so tonight for fear that it would make her drowsy.  Says that she also has some nasal congestion over the past several days which may have triggered her vertigo because she has had this combination in the past.  Denies any pressure in her ears or ringing in her ears, bilaterally.  Denies any weakness or numbness otherwise.  Past Medical History:  Diagnosis Date  . Atrial fibrillation ([place])   . Hyperlipidemia   . Migraine headache   . Stroke [facility])     Patient Active Problem List   Diagnosis Date Noted  . Dysuria 01/19/2017  . Acute bronchitis 09/19/2016  . HTN (hypertension) 09/13/2016  . Monoclonal gammopathy of unknown significance (MGUS) 09/11/2016  . Fatigue 05/05/2016  . Thrombocytopenia ([place]) 05/03/2016  . Osteoporosis 03/20/2016  . Valvular heart disease 12/23/2015  . Constipation 09/04/2015  . Perforation of right tympanic membrane 03/28/2015  . Mild cerebral atrophy 03/02/2015  . Neuropathy 03/02/2015  . History of falling, presenting hazards to health 07/17/2014  . Iron deficiency anemia due to chronic blood loss 07/17/2014  . Positive FIT (fecal immunochemical test) 07/17/2014  . Atherosclerotic peripheral vascular disease ([place]) 07/17/2014  . H/O: CVA (cerebrovascular accident) 06/14/2014  .  Subdural hematoma ([place]) 06/12/2014  . Adjustment disorder with mixed anxiety and depressed mood 10/31/2013  . History of Clostridium difficile colitis 03/05/2013  . Intertrochanteric fracture of right hip ([place]) 03/05/2013  . Lens replaced by other means 04/25/2012  . Senile cataract 04/25/2012  . Severe stage glaucoma 12/01/2011  . Paroxysmal atrial fibrillation (HCC)   . Stroke ([place])   . Migraine headache   . High cholesterol     No past surgical history on file.  Prior to Admission medications   Medication Sig Start Date End Date Taking? Authorizing Provider  ALPRAZolam (XANAX) 0.25 MG tablet Take 0.25 mg by mouth 2 (two) times daily as needed for anxiety. Takes 1/2 tablet as needed twice a day    [provider]  aspirin 81 MG tablet Take 81 mg by mouth daily.    [provider]  Cholecalciferol (EQL VITAMIN D3) 2000 units CAPS Take 1 capsule by mouth daily.    [provider]  Coenzyme Q10 (COQ10) 50 MG CAPS Take 1 capsule by mouth daily.    [provider]  docusate sodium (COLACE) 100 MG capsule Take 100 mg by mouth 2 (two) times daily.    [provider]  DULoxetine (CYMBALTA) 20 MG capsule Take 1 capsule (20 mg total) by mouth daily. With  Or after dinner 01/17/17   Crecencio Mc, MD  famotidine (PEPCID) 10 MG tablet Take 10 mg by mouth as needed for heartburn or indigestion.    [provider]  glucosamine-chondroitin 500-400 MG tablet Take 1-2 tablets by mouth daily.    [provider]  Lactobacillus-Inulin ([place]) CAPS Take 1 capsule by mouth daily. 03/04/13   Crecencio Mc, MD  lactulose (CHRONULAC) 10 GM/15ML solution TAKE 30ML EVERY FOUR HOURS UNTIL CONSTIPATION IS RELIEVED 09/05/16   Crecencio Mc, MD  metoprolol succinate (TOPROL-XL) 50 MG 24 hr tablet Take 1 tablet (50 mg total) by mouth daily. 07/24/17   Wellington Hampshire, MD  sertraline (ZOLOFT) 25 MG tablet Take 25 mg by mouth daily.     [provider]  simvastatin (ZOCOR) 10 MG tablet TAKE 1 TABLET AT BEDTIME 11/05/16   Crecencio Mc, MD    Allergies Amoxicillin; Erythromycin; and Nsaids  Family History  Problem Relation Age of Onset  . Hypertension Mother   . Diabetes Mother   . Hypertension Father     Social History Social History   Tobacco Use  . Smoking status: Former Smoker    Last attempt to quit: 03/29/1986    Years since quitting: 31.8  . Smokeless tobacco: Never Used  Substance Use Topics  . Alcohol use: No  . Drug use: No    Review of Systems  Constitutional: No fever/chills Eyes: No visual changes. ENT: No sore throat. Cardiovascular: Denies chest pain. Respiratory: Denies shortness of breath. Gastrointestinal: No abdominal pain.  No nausea, no vomiting.  No diarrhea.  No constipation. Genitourinary: Negative for dysuria. Musculoskeletal: Negative for back pain. Skin: Negative for rash. Neurological: Negative for headaches, focal weakness or numbness.   ____________________________________________   PHYSICAL EXAM:  VITAL SIGNS: ED Triage Vitals  Enc Vitals Group     BP 01/19/18 2144 (!) 158/82     Pulse Rate 01/19/18 2144 73     Resp 01/19/18 2144 19     Temp 01/19/18 2144 (!) 97.4 F (36.3 C)     Temp Source 01/19/18 2144 Oral     SpO2 01/19/18 2144 97 %     Weight 01/19/18 2146 126 lb (57.2 kg)     Height 01/19/18 2146 5\' 3"  (1.6 m)     Head Circumference --      Peak Flow --      Pain Score 01/19/18 2145 6     Pain Loc --      Pain Edu? --      Excl. in [place]? --     Constitutional: Alert and oriented. Well appearing and in no acute distress. Eyes: Conjunctivae are normal.  Head: Atraumatic.  Normal TMs bilaterally. Nose: No congestion/rhinnorhea. Mouth/Throat: Mucous membranes are moist.  Neck: No stridor.   Cardiovascular: Normal rate, regular rhythm. Grossly normal heart sounds.   Respiratory: Normal respiratory effort.  No retractions. Lungs  CTAB. Gastrointestinal: Soft and nontender. No distention.  Musculoskeletal: No lower extremity tenderness nor edema.  No joint effusions. Neurologic:  Normal speech and language. No gross focal neurologic deficits are appreciated.  No nystagmus.  No ataxia on finger-nose testing, bilaterally. Skin:  Skin is warm, dry and intact. No rash noted. Psychiatric: Mood and affect are normal. Speech and behavior are normal.  ____________________________________________   LABS (all labs ordered are listed, but only abnormal results are displayed)  Labs Reviewed  CBC WITH DIFFERENTIAL/PLATELET - Abnormal; Notable for the following components:      Result Value   RBC 3.32 (*)    Hemoglobin 11.5 (*)    HCT 32.8 (*)    MCH 34.7 (*)    Platelets 131 (*)    All other components within normal limits  BASIC METABOLIC PANEL -  Abnormal; Notable for the following components:   Glucose, Bld 114 (*)    BUN 41 (*)    Creatinine, Ser 1.01 (*)    GFR calc non Af Amer 49 (*)    GFR calc Af Amer 57 (*)    All other components within normal limits  TROPONIN I  URINALYSIS, COMPLETE (UACMP) WITH MICROSCOPIC   ____________________________________________  EKG  ED ECG REPORT I, Doran Stabler, the attending physician, personally viewed and interpreted this ECG.   Date: 01/19/2018  EKG Time: 2135  Rate: 80  Rhythm: normal sinus rhythm  Axis: Normal  Intervals:none  ST&T Change: No ST segment elevation or depression.  No abnormal T wave inversion.  ____________________________________________  RADIOLOGY  No acute intracranial abnormality. ____________________________________________   PROCEDURES  Procedure(s) performed:   Procedures  Critical Care performed:   ____________________________________________   INITIAL IMPRESSION / ASSESSMENT AND PLAN / ED COURSE  Pertinent labs & imaging results that were available during my care of the patient were reviewed by me and considered in my  medical decision making (see chart for details).  DDX: Peripheral vertigo, CVA, TIA, labyrinthitis, eustachian tube dysfunction As part of my medical decision making, I reviewed the following data within the [place] Notes from prior outpatient visits  ----------------------------------------- 11:21 PM on 01/19/2018 -----------------------------------------  Patient at this time pending urinalysis.  Labs reassuring as well as a CT of the brain.  If the patient improved after medication I feel that she will likely be safe for discharge to home.  Signed out to Dr. Mable Paris.  Likely peripheral vertigo.  No objective neurologic findings.  Worse with motion.  Patient has meclizine at home. ____________________________________________   FINAL CLINICAL IMPRESSION(S) / ED DIAGNOSES  Vertigo.  NEW MEDICATIONS STARTED DURING THIS VISIT:  New Prescriptions   No medications on file     Note:  This document was prepared using Dragon voice recognition software and may include unintentional dictation errors.     Orbie Pyo, MD 01/19/18 [phone]

## 2018-01-19 NOTE — ED Notes (Signed)
Patient reports having increased pain from her stress fractures.  Reports left foot stress fracture was dx'd this past week.  Patient reports becoming dizzy tonight and afraid she was going to fall.  Patient reports history of vertigo and states this feels similar, but not as bad.

## 2018-01-20 DIAGNOSIS — R42 Dizziness and giddiness: Secondary | ICD-10-CM | POA: Diagnosis not present

## 2018-01-20 LAB — URINALYSIS, COMPLETE (UACMP) WITH MICROSCOPIC
BACTERIA UA: NONE SEEN
Bilirubin Urine: NEGATIVE
Glucose, UA: NEGATIVE mg/dL
HGB URINE DIPSTICK: NEGATIVE
Ketones, ur: NEGATIVE mg/dL
Nitrite: NEGATIVE
PROTEIN: NEGATIVE mg/dL
Specific Gravity, Urine: 1.008 (ref 1.005–1.030)
pH: 6 (ref 5.0–8.0)

## 2018-01-20 MED ORDER — ACETAMINOPHEN 325 MG PO TABS
650.0000 mg | ORAL_TABLET | Freq: Once | ORAL | Status: AC
  Administered 2018-01-20: 650 mg via ORAL
  Filled 2018-01-20: qty 2

## 2018-01-20 NOTE — ED Notes (Signed)
Patient assisted to bathroom via wheelchair.

## 2018-01-20 NOTE — ED Notes (Signed)
Patient resting quietly at this time.

## 2018-01-20 NOTE — ED Notes (Signed)
Spoke with patient's niece Annamaria Helling [phone]) who explained that she is unable to pick patient up until later in the morning but would have patient picked up by 9:30 am.

## 2018-01-20 NOTE — ED Notes (Signed)
Resting quietly with eyes closed. No acute distress noted.

## 2018-01-20 NOTE — ED Notes (Signed)
Patient moved to 1H to await social work consult and family to pick her up later in the am.

## 2018-01-20 NOTE — ED Notes (Signed)
Continues to rest quietly with eyes closed

## 2018-01-20 NOTE — ED Notes (Signed)
Assisted patient up to toilet.  Patient with slow, shuffling gait using this RN for support.  Patient reports uses a walker at home.

## 2018-01-20 NOTE — ED Notes (Addendum)
Pt ambulatory with assistance to wheel chair. Pt and niece verbalized understanding of discharge instructions and follow-up care. VSS. Skin warm and dry. A&O.  Patient signed physical copy of discharge. Placed in medical records bin.  Niece and patient declined to speak with Education officer, museum. Stated they are planning on [place] for support for patient.

## 2018-01-27 ENCOUNTER — Other Ambulatory Visit: Payer: Self-pay | Admitting: Surgery

## 2018-01-27 DIAGNOSIS — M84374A Stress fracture, right foot, initial encounter for fracture: Secondary | ICD-10-CM

## 2018-03-12 DIAGNOSIS — M7581 Other shoulder lesions, right shoulder: Secondary | ICD-10-CM | POA: Insufficient documentation

## 2018-03-12 DIAGNOSIS — M23204 Derangement of unspecified medial meniscus due to old tear or injury, left knee: Secondary | ICD-10-CM | POA: Insufficient documentation

## 2018-05-05 ENCOUNTER — Telehealth: Payer: Self-pay | Admitting: *Deleted

## 2018-05-05 ENCOUNTER — Other Ambulatory Visit: Payer: Self-pay

## 2018-05-05 ENCOUNTER — Telehealth: Payer: Self-pay | Admitting: Oncology

## 2018-05-05 DIAGNOSIS — D472 Monoclonal gammopathy: Secondary | ICD-10-CM

## 2018-05-05 NOTE — Telephone Encounter (Signed)
Orders placed, scheduling message sent.

## 2018-05-05 NOTE — Telephone Encounter (Signed)
Per scheduling message from [facility] team. Labs\ See Alicia Moran 2 weeks later. Left VM to call office @ 10:33 on 05/05/18. BF

## 2018-05-05 NOTE — Telephone Encounter (Signed)
Patient called to report that she has "blood spots " on both of her lower extremities and is asking to be checked. Per Dr Grayland Ormond, patient has not been seen since 08/2016. He asked that Tillie Rung add the MGUS labs he orders and that patient be scheduled to see him after Thanksgiving. Tillie Rung stated she will add orders and do schedule appointment order

## 2018-05-06 ENCOUNTER — Inpatient Hospital Stay: Payer: Medicare Other | Attending: Oncology

## 2018-05-06 ENCOUNTER — Other Ambulatory Visit: Payer: Self-pay

## 2018-05-06 DIAGNOSIS — Z8673 Personal history of transient ischemic attack (TIA), and cerebral infarction without residual deficits: Secondary | ICD-10-CM | POA: Insufficient documentation

## 2018-05-06 DIAGNOSIS — E785 Hyperlipidemia, unspecified: Secondary | ICD-10-CM | POA: Insufficient documentation

## 2018-05-06 DIAGNOSIS — D472 Monoclonal gammopathy: Secondary | ICD-10-CM

## 2018-05-06 DIAGNOSIS — D509 Iron deficiency anemia, unspecified: Secondary | ICD-10-CM | POA: Insufficient documentation

## 2018-05-06 DIAGNOSIS — I4891 Unspecified atrial fibrillation: Secondary | ICD-10-CM | POA: Insufficient documentation

## 2018-05-06 DIAGNOSIS — Z87891 Personal history of nicotine dependence: Secondary | ICD-10-CM | POA: Diagnosis not present

## 2018-05-06 DIAGNOSIS — D696 Thrombocytopenia, unspecified: Secondary | ICD-10-CM | POA: Insufficient documentation

## 2018-05-06 DIAGNOSIS — Z7982 Long term (current) use of aspirin: Secondary | ICD-10-CM | POA: Diagnosis not present

## 2018-05-06 DIAGNOSIS — Z79899 Other long term (current) drug therapy: Secondary | ICD-10-CM | POA: Insufficient documentation

## 2018-05-06 LAB — CBC WITH DIFFERENTIAL/PLATELET
Abs Immature Granulocytes: 0.02 10*3/uL (ref 0.00–0.07)
BASOS PCT: 1 %
Basophils Absolute: 0.1 10*3/uL (ref 0.0–0.1)
EOS ABS: 0 10*3/uL (ref 0.0–0.5)
Eosinophils Relative: 0 %
HEMATOCRIT: 32.5 % — AB (ref 36.0–46.0)
Hemoglobin: 10.7 g/dL — ABNORMAL LOW (ref 12.0–15.0)
Immature Granulocytes: 0 %
Lymphocytes Relative: 52 %
Lymphs Abs: 3.9 10*3/uL (ref 0.7–4.0)
MCH: 32.5 pg (ref 26.0–34.0)
MCHC: 32.9 g/dL (ref 30.0–36.0)
MCV: 98.8 fL (ref 80.0–100.0)
MONOS PCT: 6 %
Monocytes Absolute: 0.5 10*3/uL (ref 0.1–1.0)
Neutro Abs: 3.1 10*3/uL (ref 1.7–7.7)
Neutrophils Relative %: 41 %
PLATELETS: 134 10*3/uL — AB (ref 150–400)
RBC: 3.29 MIL/uL — ABNORMAL LOW (ref 3.87–5.11)
RDW: 14 % (ref 11.5–15.5)
WBC: 7.5 10*3/uL (ref 4.0–10.5)
nRBC: 0 % (ref 0.0–0.2)

## 2018-05-06 LAB — BASIC METABOLIC PANEL
ANION GAP: 9 (ref 5–15)
BUN: 35 mg/dL — ABNORMAL HIGH (ref 8–23)
CHLORIDE: 103 mmol/L (ref 98–111)
CO2: 24 mmol/L (ref 22–32)
Calcium: 9.7 mg/dL (ref 8.9–10.3)
Creatinine, Ser: 0.81 mg/dL (ref 0.44–1.00)
GFR calc non Af Amer: >60 mL/min (ref >60)
Glucose, Bld: 110 mg/dL — ABNORMAL HIGH (ref 70–99)
POTASSIUM: 4.3 mmol/L (ref 3.5–5.1)
SODIUM: 136 mmol/L (ref 135–145)

## 2018-05-06 LAB — IRON AND TIBC
IRON: 56 ug/dL (ref 28–170)
SATURATION RATIOS: 19 % (ref 10.4–31.8)
TIBC: 292 ug/dL (ref 250–450)
UIBC: 236 ug/dL

## 2018-05-06 LAB — FERRITIN: FERRITIN: 116 ng/mL (ref 11–307)

## 2018-05-07 LAB — PROTEIN ELECTROPHORESIS, SERUM
A/G Ratio: 1.4 (ref 0.7–1.7)
ALPHA-2-GLOBULIN: 0.6 g/dL (ref 0.4–1.0)
Albumin ELP: 4.1 g/dL (ref 2.9–4.4)
Alpha-1-Globulin: 0.2 g/dL (ref 0.0–0.4)
BETA GLOBULIN: 0.7 g/dL (ref 0.7–1.3)
GAMMA GLOBULIN: 1.5 g/dL (ref 0.4–1.8)
Globulin, Total: 3 g/dL (ref 2.2–3.9)
M-SPIKE, %: 1.2 g/dL — AB
Total Protein ELP: 7.1 g/dL (ref 6.0–8.5)

## 2018-05-07 LAB — IGG, IGA, IGM
IGM (IMMUNOGLOBULIN M), SRM: 1462 mg/dL — AB (ref 26–217)
IgA: 68 mg/dL (ref 64–422)
IgG (Immunoglobin G), Serum: 604 mg/dL — ABNORMAL LOW (ref 700–1600)

## 2018-05-07 LAB — KAPPA/LAMBDA LIGHT CHAINS
KAPPA, LAMDA LIGHT CHAIN RATIO: 16.79 — AB (ref 0.26–1.65)
Kappa free light chain: 174.6 mg/L — ABNORMAL HIGH (ref 3.3–19.4)
Lambda free light chains: 10.4 mg/L (ref 5.7–26.3)

## 2018-05-08 ENCOUNTER — Ambulatory Visit (INDEPENDENT_AMBULATORY_CARE_PROVIDER_SITE_OTHER): Payer: Medicare Other | Admitting: Cardiovascular Disease

## 2018-05-08 ENCOUNTER — Encounter: Payer: Self-pay | Admitting: Cardiovascular Disease

## 2018-05-08 VITALS — BP 136/82 | Wt 128.0 lb

## 2018-05-08 DIAGNOSIS — I340 Nonrheumatic mitral (valve) insufficiency: Secondary | ICD-10-CM | POA: Diagnosis not present

## 2018-05-08 DIAGNOSIS — I10 Essential (primary) hypertension: Secondary | ICD-10-CM

## 2018-05-08 NOTE — Addendum Note (Signed)
Addended by: Alba Destine on: 05/08/2018 04:33 PM   Modules accepted: Orders

## 2018-05-08 NOTE — Patient Instructions (Signed)

## 2018-05-08 NOTE — Progress Notes (Signed)
Cardiology Office Note   Date:  05/08/2018   ID:  Alicia Moran, DOB 1928-10-27, MRN [id number]  PCP:  [name], MD  Cardiologist:   Lorine Bears, MD   Chief Complaint  Patient presents with  . other    6 mo f/u.Depressed. Medications reviewed verbally.      History of Present Illness: Alicia Moran is a 84 y.o. female who Is here today for a follow-up visit regarding mild to moderate valvular disease.  The patient was followed in the past at [facility] cardiology for mild valvular heart disease.  Paroxysmal atrial fibrillation is mentioned in her chart. However, after extensive review of her chart from [facility], I did not find conclusive evidence of atrial fibrillation. It appears that in 2008 she suffered from a stroke in spite of being on Plavix. Thus, she was started on anticoagulation with warfarin at that time for that indication.  She suffered from subdural hematoma in 2015 after a fall and thus warfarin was discontinued at that time and was not resumed.  she had previous cardiac catheterization at [facility] in 2006 with no obstructive coronary artery disease.   Unfortunately, she continues to deal with grief and depression after her daughter died in 06/25/2017.  She has been stable from a cardiac standpoint with no chest pain or worsening dyspnea.  No leg edema.  No palpitations.  She had an echocardiogram done in February of this year which showed normal ejection fraction, sclerotic aortic valve without stenosis, mild aortic regurgitation, mild mitral regurgitation and mild to moderate tricuspid regurgitation.  Past Medical History:  Diagnosis Date  . Atrial fibrillation (HCC)   . Hyperlipidemia   . Migraine headache   . Stroke [facility])     History reviewed. No pertinent surgical history.   Current Outpatient Medications  Medication Sig Dispense Refill  . aspirin 81 MG tablet Take 81 mg by mouth daily.    . Cholecalciferol (EQL VITAMIN D3) 2000 units CAPS Take 1  capsule by mouth daily.    . Coenzyme Q10 (COQ10) 50 MG CAPS Take 1 capsule by mouth daily.    Marland Kitchen docusate sodium (COLACE) 100 MG capsule Take 100 mg by mouth 2 (two) times daily.    . famotidine (PEPCID) 10 MG tablet Take 10 mg by mouth as needed for heartburn or indigestion.    Marland Kitchen FLUoxetine (PROZAC) 10 MG tablet Take 10 mg by mouth daily.    Marland Kitchen glucosamine-chondroitin 500-400 MG tablet Take 1-2 tablets by mouth daily.    . Lactobacillus-Inulin (CULTURELLE DIGESTIVE HEALTH) CAPS Take 1 capsule by mouth daily. 30 capsule 0  . lactulose (CHRONULAC) 10 GM/15ML solution TAKE EVERY FOUR HOURS UNTIL CONSTIPATION IS RELIEVED 240 mL 3  . metoprolol succinate (TOPROL-XL) 50 MG 24 hr tablet Take 1 tablet (50 mg total) by mouth daily. 90 tablet 3  . simvastatin (ZOCOR) 10 MG tablet TAKE 1 TABLET AT BEDTIME 90 tablet 3  . DULoxetine (CYMBALTA) 20 MG capsule Take 1 capsule (20 mg total) by mouth daily. With  Or after dinner (Patient not taking: Reported on 05/08/2018) 30 capsule 3   No current facility-administered medications for this visit.     Allergies:   Amoxicillin; Erythromycin; and Nsaids    Social History:  The patient  reports that she quit smoking about 32 years ago. She has never used smokeless tobacco. She reports that she does not drink alcohol or use drugs.   Family History:  The patient's family history  includes Diabetes in her mother; Hypertension in her father and mother.    ROS:  Please see the history of present illness.   Otherwise, review of systems are positive for none.   All other systems are reviewed and negative.    PHYSICAL EXAM: VS:  BP 136/82 (BP Location: Left Arm, Patient Position: Sitting, Cuff Size: Normal)   Wt 128 lb (58.1 kg)   BMI 22.67 kg/m  , BMI Body mass index is 22.67 kg/m. GEN: Well nourished, well developed, in no acute distress  HEENT: normal  Neck: no JVD, carotid bruits, or masses Cardiac: RRR; no rubs, or gallops,no edema . 2/6 systolic  murmur in the aortic area. 2/6 holosystolic murmur at the apex Respiratory:  clear to auscultation bilaterally, normal work of breathing GI: soft, nontender, nondistended, + BS MS: no deformity or atrophy  Skin: warm and dry, no rash Neuro:  Strength and sensation are intact Psych: euthymic mood, full affect   EKG:  EKG is ordered today. The ekg ordered today demonstrates sinus rhythm with sinus arrhythmia.   Recent Labs: 05/06/2018: BUN 35; Creatinine, Ser 0.81; Hemoglobin 10.7; Platelets 134; Potassium 4.3; Sodium 136    Lipid Panel    Component Value Date/Time   CHOL 121 08/26/2015 0941   TRIG 48.0 08/26/2015 0941   HDL 52.00 08/26/2015 0941   CHOLHDL 2 08/26/2015 0941   VLDL 9.6 08/26/2015 0941   LDLCALC 60 08/26/2015 0941      Wt Readings from Last 3 Encounters:  05/08/18 128 lb (58.1 kg)  01/19/18 126 lb (57.2 kg)  07/23/17 119 lb 12 oz (54.3 kg)        ASSESSMENT AND PLAN:  1.  Moderate valvular heart disease: Stable disease overall based on most recent echocardiogram and her physical exam today.  Continue to monitor clinically.  2. Previous history of stroke: No documented history of atrial fibrillation. Continue low-dose aspirin.   3. Essential hypertension: Blood pressure improved after increasing Toprol during last visit.  4.  Grief and depression: This has been managed by Dr. Arlana Pouch and the patient was switched recently to Prozac.    Disposition:   FU with me in 6 months.  Signed,  Lorine Bears, MD  05/08/2018 10:24 AM    [facility]

## 2018-05-11 NOTE — Progress Notes (Signed)
[name]  [phone] [phone]  ID: Alicia Moran OB: 1928/07/27  MR#: [id number]  MWU#:[id number]  Patient Care Team: Alicia Shaggy, [name] (Internal Medicine)  CHIEF COMPLAINT: Thrombocytopenia, iron deficiency anemia, MGUS.  INTERVAL HISTORY: Patient was last evaluated in clinic in March 2018.  She is referred back for continued follow-up of her known thrombocytopenia.  Patient also complains of increased bruising, particularly on her lower extremities.  She otherwise feels well and is asymptomatic.  She has no neurologic complaints. She denies any recent fevers or illnesses. She has a good appetite and denies weight loss. She has no chest pain or shortness of breath. She denies any nausea, vomiting, constipation, or diarrhea. She has no melena or hematochezia. She has no urinary complaints.  Patient offers no further specific complaints today.  REVIEW OF SYSTEMS:   Review of Systems  Constitutional: Negative.  Negative for fever, malaise/fatigue and weight loss.  Respiratory: Negative.  Negative for cough and shortness of breath.   Cardiovascular: Negative.  Negative for chest pain and leg swelling.  Gastrointestinal: Negative for abdominal pain, blood in stool and melena.  Genitourinary: Negative.  Negative for hematuria.  Musculoskeletal: Negative.  Negative for back pain.  Skin: Negative.  Negative for rash.  Neurological: Negative.  Negative for focal weakness, weakness and headaches.  Endo/Heme/Allergies: Bruises/bleeds easily.  Psychiatric/Behavioral: Negative.  The patient is not nervous/anxious.     As per HPI. Otherwise, a complete review of systems is negative.  PAST MEDICAL HISTORY: Past Medical History:  Diagnosis Date  . Atrial fibrillation (HCC)   . Hyperlipidemia   . Migraine headache   . Stroke [name])     PAST SURGICAL HISTORY: No past surgical history on file.  FAMILY HISTORY: Family History    Problem Relation Age of Onset  . Hypertension Mother   . Diabetes Mother   . Hypertension Father     ADVANCED DIRECTIVES (Y/N):  N  HEALTH MAINTENANCE: Social History   Tobacco Use  . Smoking status: Former Smoker    Last attempt to quit: 03/29/1986    Years since quitting: 32.1  . Smokeless tobacco: Never Used  Substance Use Topics  . Alcohol use: No  . Drug use: No     Colonoscopy:  PAP:  Bone density:  Lipid panel:  Allergies  Allergen Reactions  . Amoxicillin Diarrhea  . Erythromycin   . Nsaids Other (See Comments)    Other Reaction: GI ulcers    Current Outpatient Medications  Medication Sig Dispense Refill  . ALPRAZolam (XANAX) 0.25 MG tablet Take 0.25 mg by mouth at bedtime as needed for anxiety.    Marland Kitchen aspirin 81 MG tablet Take 81 mg by mouth daily.    . Cholecalciferol (EQL VITAMIN D3) 2000 units CAPS Take 1 capsule by mouth daily.    . Coenzyme Q10 (COQ10) 50 MG CAPS Take 1 capsule by mouth daily.    . famotidine (PEPCID) 10 MG tablet Take 10 mg by mouth as needed for heartburn or indigestion.    Marland Kitchen FLUoxetine (PROZAC) 10 MG tablet Take 10 mg by mouth daily.    Marland Kitchen glucosamine-chondroitin 500-400 MG tablet Take 1-2 tablets by mouth daily.    . metoprolol succinate (TOPROL-XL) 50 MG 24 hr tablet Take 1 tablet (50 mg total) by mouth daily. 90 tablet 3  . simvastatin (ZOCOR) 10 MG tablet TAKE 1 TABLET AT BEDTIME 90 tablet 3   No current facility-administered medications for this visit.  OBJECTIVE: Vitals:   05/13/18 0953  BP: 120/60  Temp: 97.8 F (36.6 C)     Body mass index is 22.8 kg/m.    ECOG FS:0 - Asymptomatic  General: Well-developed, well-nourished, no acute distress. Eyes: Pink conjunctiva, anicteric sclera. HEENT: Normocephalic, moist mucous membranes. Lungs: Clear to auscultation bilaterally. Heart: Regular rate and rhythm. No rubs, murmurs, or gallops. Abdomen: Soft, nontender, nondistended. No organomegaly noted, normoactive bowel  sounds. Musculoskeletal: No edema, cyanosis, or clubbing. Neuro: Alert, answering all questions appropriately. Cranial nerves grossly intact. Skin: No rashes or petechiae noted. Psych: Normal affect.  LAB RESULTS:  Lab Results  Component Value Date   NA 136 05/06/2018   K 4.3 05/06/2018   CL 103 05/06/2018   CO2 24 05/06/2018   GLUCOSE 110 (H) 05/06/2018   BUN 35 (H) 05/06/2018   CREATININE 0.81 05/06/2018   CALCIUM 9.7 05/06/2018   PROT 7.1 01/04/2017   ALBUMIN 4.3 01/04/2017   AST 17 01/04/2017   ALT 12 01/04/2017   ALKPHOS 69 01/04/2017   BILITOT 0.5 01/04/2017   GFRNONAA >60 05/06/2018   GFRAA >60 05/06/2018    Lab Results  Component Value Date   WBC 7.5 05/06/2018   NEUTROABS 3.1 05/06/2018   HGB 10.7 (L) 05/06/2018   HCT 32.5 (L) 05/06/2018   MCV 98.8 05/06/2018   PLT 134 (L) 05/06/2018   Lab Results  Component Value Date   IRON 36 05/13/2018   TIBC 262 05/13/2018   IRONPCTSAT 14 05/13/2018   Lab Results  Component Value Date   FERRITIN 116 05/06/2018   Lab Results  Component Value Date   TOTALPROTELP 7.1 05/06/2018   ALBUMINELP 4.1 05/06/2018   A1GS 0.2 05/06/2018   A2GS 0.6 05/06/2018   BETS 0.7 05/06/2018   GAMS 1.5 05/06/2018   MSPIKE 1.2 (H) 05/06/2018   SPEI Comment 05/06/2018   Lab Results  Component Value Date   CEA 1.9 05/30/2016     STUDIES: No results found.  ASSESSMENT: Thrombocytopenia, Iron deficiency anemia, MGUS  PLAN:    1. MGUS: Patient's most recent M spike was reported at 1.2, which is essentially unchanged.  She has an elevated IgM level of 1462 with a mildly decreased IgG.  Her kappa/lambda free light chain ratio is elevated at 16.79.  Patient has a mild anemia as well as thrombocytopenia, but no significant evidence of endorgan damage.  If patient develops multiple myeloma treatment would be difficult given her advanced age. No further intervention is needed. Patient does not require a metastatic bone survey or bone  marrow biopsy at this time.  Return to clinic in 6 months with repeat laboratory work and further evaluation. 2. Thrombocytopenia: Chronic and unchanged.  Patient's platelet count is 134 today.  This is likely not the etiology of her bruising.  Follow-up as above.   3. Iron deficiency anemia: Patient's hemoglobin is decreased, but essentially unchanged.  Her iron stores continue to be within normal limits.  Previously, the remainder of her laboratory work, including hemolysis labs were also within normal limits. No intervention is needed at this time.  4. History of heme positive stools: Patient declined any further colonoscopies.  Previously, CEA was within normal limits. 5.  Lower extremity bruising: Likely secondary to trauma.  Platelet count as above.  Von Willebrand's panel as well as PT/INR and PTT are all within normal limits.  No intervention is needed.  Patient expressed understanding and was in agreement with this plan. She also understands that She can  call clinic at any time with any questions, concerns, or complaints.    [name], MD   05/18/2018 10:05 AM

## 2018-05-13 ENCOUNTER — Inpatient Hospital Stay (HOSPITAL_BASED_OUTPATIENT_CLINIC_OR_DEPARTMENT_OTHER): Payer: Medicare Other | Admitting: Oncology

## 2018-05-13 ENCOUNTER — Inpatient Hospital Stay: Payer: Medicare Other

## 2018-05-13 ENCOUNTER — Other Ambulatory Visit: Payer: Self-pay

## 2018-05-13 VITALS — BP 120/60 | Temp 97.8°F | Wt 128.7 lb

## 2018-05-13 DIAGNOSIS — D472 Monoclonal gammopathy: Secondary | ICD-10-CM | POA: Diagnosis not present

## 2018-05-13 DIAGNOSIS — D509 Iron deficiency anemia, unspecified: Secondary | ICD-10-CM | POA: Diagnosis not present

## 2018-05-13 DIAGNOSIS — D696 Thrombocytopenia, unspecified: Secondary | ICD-10-CM | POA: Diagnosis not present

## 2018-05-13 DIAGNOSIS — Z87891 Personal history of nicotine dependence: Secondary | ICD-10-CM

## 2018-05-13 DIAGNOSIS — Z7982 Long term (current) use of aspirin: Secondary | ICD-10-CM

## 2018-05-13 DIAGNOSIS — Z8673 Personal history of transient ischemic attack (TIA), and cerebral infarction without residual deficits: Secondary | ICD-10-CM

## 2018-05-13 DIAGNOSIS — E785 Hyperlipidemia, unspecified: Secondary | ICD-10-CM

## 2018-05-13 DIAGNOSIS — I4891 Unspecified atrial fibrillation: Secondary | ICD-10-CM

## 2018-05-13 DIAGNOSIS — Z79899 Other long term (current) drug therapy: Secondary | ICD-10-CM

## 2018-05-13 LAB — PROTIME-INR
INR: 1.07
Prothrombin Time: 13.8 seconds (ref 11.4–15.2)

## 2018-05-13 LAB — APTT: APTT: 29 s (ref 24–36)

## 2018-05-13 LAB — IRON AND TIBC
Iron: 36 ug/dL (ref 28–170)
SATURATION RATIOS: 14 % (ref 10.4–31.8)
TIBC: 262 ug/dL (ref 250–450)
UIBC: 226 ug/dL

## 2018-05-13 NOTE — Progress Notes (Signed)
Patient is here to follow up on thrombocytopenia. Patient also stated that she tends to bump herself on things and bruises easily and turn brown on her bilateral extremity and some on her forearms. Patient stated that she stays tired and weak all the time.

## 2018-05-14 LAB — VON WILLEBRAND PANEL
Coagulation Factor VIII: 155 % — ABNORMAL HIGH (ref 56–140)
Ristocetin Co-factor, Plasma: 122 % (ref 50–200)
VON WILLEBRAND ANTIGEN, PLASMA: 122 % (ref 50–200)

## 2018-05-14 LAB — COAG STUDIES INTERP REPORT

## 2018-05-27 ENCOUNTER — Other Ambulatory Visit: Payer: Self-pay | Admitting: Cardiovascular Disease

## 2018-06-30 DIAGNOSIS — M47812 Spondylosis without myelopathy or radiculopathy, cervical region: Secondary | ICD-10-CM | POA: Insufficient documentation

## 2018-06-30 DIAGNOSIS — S46912A Strain of unspecified muscle, fascia and tendon at shoulder and upper arm level, left arm, initial encounter: Secondary | ICD-10-CM | POA: Insufficient documentation

## 2018-11-06 ENCOUNTER — Other Ambulatory Visit: Payer: Medicare Other

## 2018-11-13 ENCOUNTER — Inpatient Hospital Stay: Payer: Medicare Other | Admitting: Oncology

## 2018-11-20 ENCOUNTER — Telehealth: Payer: Self-pay | Admitting: Cardiovascular Disease

## 2018-11-20 NOTE — Telephone Encounter (Signed)
Virtual Visit Pre-Appointment Phone Call  "(Name), I am calling you today to discuss your upcoming appointment. We are currently trying to limit exposure to the virus that causes COVID-19 by seeing patients at home rather than in the office."  1. "What is the BEST phone number to call the day of the visit?" - include this in appointment notes  2. Do you have or have access to (through a family member/friend) a smartphone with video capability that we can use for your visit?" a. If yes - list this number in appt notes as cell (if different from BEST phone #) and list the appointment type as a VIDEO visit in appointment notes b. If no - list the appointment type as a PHONE visit in appointment notes  3. Confirm consent - "In the setting of the current Covid19 crisis, you are scheduled for a (phone or video) visit with your provider on (date) at (time).  Just as we do with many in-office visits, in order for you to participate in this visit, we must obtain consent.  If you'd like, I can send this to your mychart (if signed up) or email for you to review.  Otherwise, I can obtain your verbal consent now.  All virtual visits are billed to your insurance company just like a normal visit would be.  By agreeing to a virtual visit, we'd like you to understand that the technology does not allow for your provider to perform an examination, and thus may limit your provider's ability to fully assess your condition. If your provider identifies any concerns that need to be evaluated in person, we will make arrangements to do so.  Finally, though the technology is pretty good, we cannot assure that it will always work on either your or our end, and in the setting of a video visit, we may have to convert it to a phone-only visit.  In either situation, we cannot ensure that we have a secure connection.  Are you willing to proceed?" STAFF: Did the patient verbally acknowledge consent to telehealth visit? Document  YES/NO here: YES  4. Advise patient to be prepared - "Two hours prior to your appointment, go ahead and check your blood pressure, pulse, oxygen saturation, and your weight (if you have the equipment to check those) and write them all down. When your visit starts, your provider will ask you for this information. If you have an Apple Watch or Kardia device, please plan to have heart rate information ready on the day of your appointment. Please have a pen and paper handy nearby the day of the visit as well."  5. Give patient instructions for MyChart download to smartphone OR Doximity/Doxy.me as below if video visit (depending on what platform provider is using)  6. Inform patient they will receive a phone call 15 minutes prior to their appointment time (may be from unknown caller ID) so they should be prepared to answer    TELEPHONE CALL NOTE  Natale Barba Dyck has been deemed a candidate for a follow-up tele-health visit to limit community exposure during the Covid-19 pandemic. I spoke with the patient via phone to ensure availability of phone/video source, confirm preferred email & phone number, and discuss instructions and expectations.  I reminded Markeia Harkless Engelbrecht to be prepared with any vital sign and/or heart rhythm information that could potentially be obtained via home monitoring, at the time of her visit. I reminded Francisca Langenderfer Kaluzny to expect a phone call prior to  her visit.  Clarisse Gouge 11/20/2018 10:09 AM   INSTRUCTIONS FOR DOWNLOADING THE MYCHART APP TO SMARTPHONE  - The patient must first make sure to have activated MyChart and know their login information - If Apple, go to [employer] and type in MyChart in the search bar and download the app. If Android, ask patient to go to [employer] and type in [place] in the search bar and download the app. The app is free but as with any other app downloads, their phone may require them to verify saved payment information or  Apple/Android password.  - The patient will need to then log into the app with their MyChart username and password, and select [facility] as their healthcare provider to link the account. When it is time for your visit, go to the MyChart app, find appointments, and click Begin Video Visit. Be sure to Select Allow for your device to access the Microphone and Camera for your visit. You will then be connected, and your provider will be with you shortly.  **If they have any issues connecting, or need assistance please contact MyChart service desk [phone])**  **If using a computer, in order to ensure the best quality for their visit they will need to use either of the following Internet Browsers: [employer], or Google Chrome**  IF USING DOXIMITY or DOXY.ME - The patient will receive a link just prior to their visit by text.     FULL LENGTH CONSENT FOR TELE-HEALTH VISIT   I hereby voluntarily request, consent and authorize [facility] and its employed or contracted physicians, physician assistants, nurse practitioners or other licensed health care professionals (the Practitioner), to provide me with telemedicine health care services (the Services") as deemed necessary by the treating Practitioner. I acknowledge and consent to receive the Services by the Practitioner via telemedicine. I understand that the telemedicine visit will involve communicating with the Practitioner through live audiovisual communication technology and the disclosure of certain medical information by electronic transmission. I acknowledge that I have been given the opportunity to request an in-person assessment or other available alternative prior to the telemedicine visit and am voluntarily participating in the telemedicine visit.  I understand that I have the right to withhold or withdraw my consent to the use of telemedicine in the course of my care at any time, without affecting my right to future care  or treatment, and that the Practitioner or I may terminate the telemedicine visit at any time. I understand that I have the right to inspect all information obtained and/or recorded in the course of the telemedicine visit and may receive copies of available information for a reasonable fee.  I understand that some of the potential risks of receiving the Services via telemedicine include:   Delay or interruption in medical evaluation due to technological equipment failure or disruption;  Information transmitted may not be sufficient (e.g. poor resolution of images) to allow for appropriate medical decision making by the Practitioner; and/or   In rare instances, security protocols could fail, causing a breach of personal health information.  Furthermore, I acknowledge that it is my responsibility to provide information about my medical history, conditions and care that is complete and accurate to the best of my ability. I acknowledge that Practitioner's advice, recommendations, and/or decision may be based on factors not within their control, such as incomplete or inaccurate data provided by me or distortions of diagnostic images or specimens that may result from electronic transmissions. I  understand that the practice of medicine is not an exact science and that Practitioner makes no warranties or guarantees regarding treatment outcomes. I acknowledge that I will receive a copy of this consent concurrently upon execution via email to the email address I last provided but may also request a printed copy by calling the office of [facility].    I understand that my insurance will be billed for this visit.   I have read or had this consent read to me.  I understand the contents of this consent, which adequately explains the benefits and risks of the Services being provided via telemedicine.   I have been provided ample opportunity to ask questions regarding this consent and the Services and have had  my questions answered to my satisfaction.  I give my informed consent for the services to be provided through the use of telemedicine in my medical care  By participating in this telemedicine visit I agree to the above.

## 2018-11-21 ENCOUNTER — Other Ambulatory Visit: Payer: Self-pay

## 2018-11-21 ENCOUNTER — Encounter: Payer: Self-pay | Admitting: Cardiovascular Disease

## 2018-11-21 ENCOUNTER — Telehealth (INDEPENDENT_AMBULATORY_CARE_PROVIDER_SITE_OTHER): Payer: Medicare Other | Admitting: Cardiovascular Disease

## 2018-11-21 VITALS — BP 132/68 | HR 63 | Ht 63.0 in | Wt 128.0 lb

## 2018-11-21 DIAGNOSIS — Z8673 Personal history of transient ischemic attack (TIA), and cerebral infarction without residual deficits: Secondary | ICD-10-CM | POA: Diagnosis not present

## 2018-11-21 DIAGNOSIS — I10 Essential (primary) hypertension: Secondary | ICD-10-CM | POA: Diagnosis not present

## 2018-11-21 DIAGNOSIS — F329 Major depressive disorder, single episode, unspecified: Secondary | ICD-10-CM

## 2018-11-21 DIAGNOSIS — I340 Nonrheumatic mitral (valve) insufficiency: Secondary | ICD-10-CM

## 2018-11-21 NOTE — Progress Notes (Signed)
Virtual Visit via Telephone Note   This visit type was conducted due to national recommendations for restrictions regarding the COVID-19 Pandemic (e.g. social distancing) in an effort to limit this patient's exposure and mitigate transmission in our community.  Due to her co-morbid illnesses, this patient is at least at moderate risk for complications without adequate follow up.  This format is felt to be most appropriate for this patient at this time.  The patient did not have access to video technology/had technical difficulties with video requiring transitioning to audio format only (telephone).  All issues noted in this document were discussed and addressed.  No physical exam could be performed with this format.  Please refer to the patient's chart for her  consent to telehealth for [facility].   Date:  11/21/2018   ID:  Alicia Moran, DOB 12-May-1929, MRN [id number]  Patient Location: Home Provider Location: Office  PCP:  [name], MD  Cardiologist:  Lorine Bears, MD  Electrophysiologist:  None   Evaluation Performed:  Follow-Up Visit  Chief Complaint: No cardiac complaints  History of Present Illness:    Alicia Moran is a 85 y.o. female who was reached via phone today for follow-up visit regarding mild to moderate valvular disease. Paroxysmal atrial fibrillation is mentioned in her chart. However, after extensive review of her chart from [facility], I did not find conclusive evidence of atrial fibrillation. It appears that in 2008 she suffered from a stroke in spite of being on Plavix. Thus, she was started on anticoagulation with warfarin at that time for that indication.  She suffered from subdural hematoma in 2015 after a fall and thus warfarin was discontinued at that time and was not resumed.  she had previous cardiac catheterization at [facility] in 2006 with no obstructive coronary artery disease.   Her daughter died in 12/24/2018and since then the patient had  significant depression and grief.  She has been stable from a cardiac standpoint with no chest pain.  She has stable exertional dyspnea.  She had an echocardiogram done in February of 2019 which showed normal ejection fraction, sclerotic aortic valve without stenosis, mild aortic regurgitation, mild mitral regurgitation and mild to moderate tricuspid regurgitation.  The patient does not have symptoms concerning for COVID-19 infection (fever, chills, cough, or new shortness of breath).    Past Medical History:  Diagnosis Date  . Atrial fibrillation (HCC)   . Hyperlipidemia   . Migraine headache   . Stroke [facility])    No past surgical history on file.   Current Meds  Medication Sig  . ALPRAZolam (XANAX) 0.25 MG tablet Take 0.25 mg by mouth at bedtime as needed for anxiety.  Marland Kitchen aspirin 81 MG tablet Take 81 mg by mouth daily.  . Cholecalciferol (EQL VITAMIN D3) 2000 units CAPS Take 1 capsule by mouth daily.  . Coenzyme Q10 (COQ10) 50 MG CAPS Take 1 capsule by mouth daily.  . famotidine (PEPCID) 10 MG tablet Take 10 mg by mouth as needed for heartburn or indigestion.  Marland Kitchen FLUoxetine (PROZAC) 20 MG capsule Take 20 mg by mouth daily.   . metoprolol succinate (TOPROL-XL) 50 MG 24 hr tablet TAKE 1 TABLET EVERY DAY  . Multiple Vitamins-Minerals (PRESERVISION AREDS 2+MULTI VIT PO) Take by mouth daily.  . simvastatin (ZOCOR) 10 MG tablet TAKE 1 TABLET AT BEDTIME     Allergies:   Amoxicillin; Erythromycin; and Nsaids   Social History   Tobacco Use  . Smoking status: Former  Smoker    Last attempt to quit: 03/29/1986    Years since quitting: 32.6  . Smokeless tobacco: Never Used  Substance Use Topics  . Alcohol use: No  . Drug use: No     Family Hx: The patient's family history includes Diabetes in her mother; Hypertension in her father and mother.  ROS:   Please see the history of present illness.     All other systems reviewed and are negative.   Prior CV studies:   The following  studies were reviewed today:    Labs/Other Tests and Data Reviewed:    EKG:  No ECG reviewed.  Recent Labs: 05/06/2018: BUN 35; Creatinine, Ser 0.81; Hemoglobin 10.7; Platelets 134; Potassium 4.3; Sodium 136   Recent Lipid Panel Lab Results  Component Value Date/Time   CHOL 121 08/26/2015 09:41 AM   TRIG 48.0 08/26/2015 09:41 AM   HDL 52.00 08/26/2015 09:41 AM   CHOLHDL 2 08/26/2015 09:41 AM   LDLCALC 60 08/26/2015 09:41 AM    Wt Readings from Last 3 Encounters:  11/21/18 128 lb (58.1 kg)  05/13/18 128 lb 11.2 oz (58.4 kg)  05/08/18 128 lb (58.1 kg)     Objective:    Vital Signs:  BP 132/68   Pulse 63   Ht 5\' 3"  (1.6 m)   Wt 128 lb (58.1 kg)   BMI 22.67 kg/m    VITAL SIGNS:  reviewed  ASSESSMENT & PLAN:    1.  Moderate valvular heart disease: Stable disease overall based on most recent echocardiogram.  Continue to monitor clinically.  2. Previous history of stroke: No documented history of atrial fibrillation. Continue low-dose aspirin.   3. Essential hypertension: Blood pressure has been controlled since increasing Toprol.  4.  Grief and depression: Currently on Prozac.  COVID-19 Education: The signs and symptoms of COVID-19 were discussed with the patient and how to seek care for testing (follow up with PCP or arrange E-visit).  The importance of social distancing was discussed today.  Time:   Today, I have spent 8 minutes with the patient with telehealth technology discussing the above problems.     Medication Adjustments/Labs and Tests Ordered: Current medicines are reviewed at length with the patient today.  Concerns regarding medicines are outlined above.   Tests Ordered: No orders of the defined types were placed in this encounter.   Medication Changes: No orders of the defined types were placed in this encounter.   Disposition:  Follow up in 6 month(s)  Signed, Lorine Bears, MD  11/21/2018 12:58 PM    [facility]

## 2018-11-21 NOTE — Patient Instructions (Signed)
Medication Instructions:  No change in medications If you need a refill on your cardiac medications before your next appointment, please call your pharmacy.   Lab work: None If you have labs (blood work) drawn today and your tests are completely normal, you will receive your results only by: . MyChart Message (if you have MyChart) OR . A paper copy in the mail If you have any lab test that is abnormal or we need to change your treatment, we will call you to review the results.  Testing/Procedures: None  Follow-Up: At [facility], you and your health needs are our priority.  As part of our continuing mission to provide you with exceptional heart care, we have created designated Provider Care Teams.  These Care Teams include your primary Cardiologist (physician) and Advanced Practice Providers (APPs -  Physician Assistants and Nurse Practitioners) who all work together to provide you with the care you need, when you need it. You will need a follow up appointment in 6 months.  Please call our office 2 months in advance to schedule this appointment.  You may see Muhammad Arida, MD or one of the following Advanced Practice Providers on your designated Care Team:   Christopher Berge, NP Ryan Dunn, PA-C . Jacquelyn Visser, PA-C   

## 2018-12-24 ENCOUNTER — Other Ambulatory Visit: Payer: Self-pay | Admitting: Unknown Physician Specialty

## 2018-12-24 DIAGNOSIS — R1319 Other dysphagia: Secondary | ICD-10-CM

## 2018-12-24 DIAGNOSIS — R1310 Dysphagia, unspecified: Secondary | ICD-10-CM

## 2018-12-30 ENCOUNTER — Ambulatory Visit: Admission: RE | Admit: 2018-12-30 | Payer: Medicare Other | Source: Ambulatory Visit

## 2018-12-30 ENCOUNTER — Other Ambulatory Visit: Payer: Self-pay

## 2018-12-30 ENCOUNTER — Ambulatory Visit
Admission: RE | Admit: 2018-12-30 | Discharge: 2018-12-30 | Disposition: A | Discharge status: Home / Self Care | Payer: Medicare Other | Source: Ambulatory Visit | Attending: Unknown Physician Specialty | Admitting: Unknown Physician Specialty

## 2018-12-30 DIAGNOSIS — R1310 Dysphagia, unspecified: Secondary | ICD-10-CM | POA: Insufficient documentation

## 2018-12-30 DIAGNOSIS — R1319 Other dysphagia: Secondary | ICD-10-CM

## 2019-03-12 ENCOUNTER — Other Ambulatory Visit: Payer: Self-pay | Admitting: Cardiovascular Disease

## 2019-03-19 ENCOUNTER — Telehealth: Payer: Self-pay

## 2019-03-19 NOTE — Telephone Encounter (Signed)
Attempted to call patient. LMTCB 03/19/2019   Returned call to patient. 3:37 PM  I do not see any orders or in the notes for echo. Dr. Fletcher Anon wanted her to follow up 6 months. (last OV 6/5).   Last echo done feb 2019. I would suggest that she wait until next visit for further orders.   Advised pt to call for any further questions or concerns.

## 2019-03-25 ENCOUNTER — Other Ambulatory Visit: Payer: Self-pay | Admitting: Surgery

## 2019-03-25 DIAGNOSIS — M84374A Stress fracture, right foot, initial encounter for fracture: Secondary | ICD-10-CM

## 2019-05-05 ENCOUNTER — Other Ambulatory Visit: Payer: Medicare Other

## 2019-05-05 ENCOUNTER — Ambulatory Visit: Payer: Medicare Other | Admitting: Oncology

## 2019-05-08 ENCOUNTER — Ambulatory Visit: Payer: Medicare Other | Admitting: Oncology

## 2019-05-12 ENCOUNTER — Ambulatory Visit: Payer: Medicare Other | Admitting: Cardiovascular Disease

## 2019-05-28 ENCOUNTER — Encounter: Payer: Self-pay | Admitting: Cardiovascular Disease

## 2019-05-28 ENCOUNTER — Other Ambulatory Visit: Payer: Self-pay

## 2019-05-28 ENCOUNTER — Telehealth (INDEPENDENT_AMBULATORY_CARE_PROVIDER_SITE_OTHER): Payer: Medicare Other | Admitting: Cardiovascular Disease

## 2019-05-28 VITALS — BP 144/67 | HR 77 | Ht 62.0 in | Wt 128.0 lb

## 2019-05-28 DIAGNOSIS — R0602 Shortness of breath: Secondary | ICD-10-CM

## 2019-05-28 DIAGNOSIS — I10 Essential (primary) hypertension: Secondary | ICD-10-CM

## 2019-05-28 NOTE — Patient Instructions (Signed)
Medication Instructions:  Your physician recommends that you continue on your current medications as directed. Please refer to the Current Medication list given to you today.  *If you need a refill on your cardiac medications before your next appointment, please call your pharmacy*  Lab Work: None ordered If you have labs (blood work) drawn today and your tests are completely normal, you will receive your results only by: Marland Kitchen MyChart Message (if you have MyChart) OR . A paper copy in the mail If you have any lab test that is abnormal or we need to change your treatment, we will call you to review the results.  Testing/Procedures: Your physician has requested that you have an echocardiogram. Echocardiography is a painless test that uses sound waves to create images of your heart. It provides your doctor with information about the size and shape of your heart and how well your heart's chambers and valves are working. This procedure takes approximately one hour. There are no restrictions for this procedure. (To be scheduled in Feb 2021)  Follow-Up: At [facility], you and your health needs are our priority.  As part of our continuing mission to provide you with exceptional heart care, we have created designated Provider Care Teams.  These Care Teams include your primary Cardiologist (physician) and Advanced Practice Providers (APPs -  Physician Assistants and Nurse Practitioners) who all work together to provide you with the care you need, when you need it.  Your next appointment:   2 month(s) same day as the echocardiogram  The format for your next appointment:   In Person  Provider:    You may see Kathlyn Sacramento, MD or one of the following Advanced Practice Providers on your designated Care Team:    Murray Hodgkins, NP  Christell Faith, PA-C  Marrianne Mood, PA-C   Other Instructions N/A

## 2019-05-28 NOTE — Progress Notes (Signed)
Virtual Visit via Telephone Note   This visit type was conducted due to national recommendations for restrictions regarding the COVID-19 Pandemic (e.g. social distancing) in an effort to limit this patient's exposure and mitigate transmission in our community.  Due to her co-morbid illnesses, this patient is at least at moderate risk for complications without adequate follow up.  This format is felt to be most appropriate for this patient at this time.  The patient did not have access to video technology/had technical difficulties with video requiring transitioning to audio format only (telephone).  All issues noted in this document were discussed and addressed.  No physical exam could be performed with this format.  Please refer to the patient's chart for her  consent to telehealth for [facility].   Date:  05/28/2019   ID:  Alicia Moran, DOB 18-Mar-1929, MRN [id number]  Patient Location: Home Provider Location: Office  PCP:  [name], MD  Cardiologist:  Lorine Bears, MD  Electrophysiologist:  None   Evaluation Performed:  Follow-Up Visit  Chief Complaint: Shortness of breath  History of Present Illness:    Alicia Moran is a 85 y.o. female who was reached via phone today for follow-up visit regarding mild to moderate valvular disease. Paroxysmal atrial fibrillation is mentioned in her chart. However, after extensive review of her chart from [facility], I did not find conclusive evidence of atrial fibrillation. It appears that in 2008 she suffered from a stroke in spite of being on Plavix. Thus, she was started on anticoagulation with warfarin at that time for that indication.  She suffered from subdural hematoma in 2015 after a fall and thus warfarin was discontinued at that time and was not resumed.  she had previous cardiac catheterization at [facility] in 2006 with no obstructive coronary artery disease.   Her daughter died in Jan 15, 2019and since then the patient had  significant depression and grief.  Most recent echocardiogram in February of 2019 showed normal ejection fraction, sclerotic aortic valve without stenosis, mild aortic regurgitation, mild mitral regurgitation and mild to moderate tricuspid regurgitation.  Since last visit, she reports significant worsening of exertional dyspnea without chest pain.  She also gets dizzy with exertion.  No orthopnea or PND.  No significant leg edema  She lives by herself and does not drive but she gets help from her niece.  The patient does not have symptoms concerning for COVID-19 infection (fever, chills, cough, or new shortness of breath).    Past Medical History:  Diagnosis Date  . Atrial fibrillation (HCC)   . Hyperlipidemia   . Migraine headache   . Stroke [facility])    No past surgical history on file.   Current Meds  Medication Sig  . ALPRAZolam (XANAX) 0.25 MG tablet Take 0.25 mg by mouth at bedtime as needed for anxiety.  Marland Kitchen aspirin 81 MG tablet Take 81 mg by mouth daily.  . Cholecalciferol (EQL VITAMIN D3) 2000 units CAPS Take 1 capsule by mouth daily.  . Coenzyme Q10 (COQ10) 50 MG CAPS Take 1 capsule by mouth daily.  . famotidine (PEPCID) 10 MG tablet Take 10 mg by mouth as needed for heartburn or indigestion.  . metoprolol succinate (TOPROL-XL) 50 MG 24 hr tablet TAKE 1 TABLET EVERY DAY  . Multiple Vitamins-Minerals (PRESERVISION AREDS 2+MULTI VIT PO) Take by mouth daily.  . simvastatin (ZOCOR) 10 MG tablet TAKE 1 TABLET AT BEDTIME     Allergies:   Amoxicillin, Erythromycin, and Nsaids  Social History   Tobacco Use  . Smoking status: Former Smoker    Quit date: 03/29/1986    Years since quitting: 33.1  . Smokeless tobacco: Never Used  Substance Use Topics  . Alcohol use: No  . Drug use: No     Family Hx: The patient's family history includes Diabetes in her mother; Hypertension in her father and mother.  ROS:   Please see the history of present illness.     All other systems  reviewed and are negative.   Prior CV studies:   The following studies were reviewed today:    Labs/Other Tests and Data Reviewed:    EKG:  No ECG reviewed.  Recent Labs: No results found for requested labs within last 8760 hours.   Recent Lipid Panel Lab Results  Component Value Date/Time   CHOL 121 08/26/2015 09:41 AM   TRIG 48.0 08/26/2015 09:41 AM   HDL 52.00 08/26/2015 09:41 AM   CHOLHDL 2 08/26/2015 09:41 AM   LDLCALC 60 08/26/2015 09:41 AM    Wt Readings from Last 3 Encounters:  05/28/19 128 lb (58.1 kg)  11/21/18 128 lb (58.1 kg)  05/13/18 128 lb 11.2 oz (58.4 kg)     Objective:    Vital Signs:  BP (!) 144/67   Pulse 77   Ht 5\' 2"  (1.575 m)   Wt 128 lb (58.1 kg)   BMI 23.41 kg/m    VITAL SIGNS:  reviewed  ASSESSMENT & PLAN:    1.  Moderate valvular heart disease: She reports worsening exertional dyspnea and thus I am going to bring her back in 2 months from now to do an echocardiogram and an office visit with an EKG.  She will require routine labs then if not done recently by her primary care physician.  2. Previous history of stroke: No documented history of atrial fibrillation. Continue low-dose aspirin.   3. Essential hypertension: Blood pressure is reasonably controlled on Toprol  4.  Grief and depression: She reports that antidepressant were not effective and currently using Xanax as needed.  COVID-19 Education: The signs and symptoms of COVID-19 were discussed with the patient and how to seek care for testing (follow up with PCP or arrange E-visit).  The importance of social distancing was discussed today.  Time:   Today, I have spent 8 minutes with the patient with telehealth technology discussing the above problems.     Medication Adjustments/Labs and Tests Ordered: Current medicines are reviewed at length with the patient today.  Concerns regarding medicines are outlined above.   Tests Ordered: No orders of the defined types were  placed in this encounter.   Medication Changes: No orders of the defined types were placed in this encounter.   Disposition:  Follow up in 2 months  Signed, Lorine Bears, MD  05/28/2019 9:44 AM    [facility]

## 2019-07-27 ENCOUNTER — Ambulatory Visit: Payer: Medicare Other | Attending: Internal Medicine

## 2019-07-27 DIAGNOSIS — Z23 Encounter for immunization: Secondary | ICD-10-CM | POA: Insufficient documentation

## 2019-07-27 NOTE — Progress Notes (Signed)
Covid-19 Vaccination Clinic  Name:  Alicia Moran    MRN: [id number] DOB: 02/06/1929  07/27/2019  Alicia Moran was observed post Covid-19 immunization for 15 minutes without incidence. She was provided with Vaccine Information Sheet and instruction to access the V-Safe system.   Alicia Moran was instructed to call 911 with any severe reactions post vaccine: Marland Kitchen Difficulty breathing  . Swelling of your face and throat  . A fast heartbeat  . A bad rash all over your body  . Dizziness and weakness   Alicia Moran stated she thought her heart rate was a little fast. Assessment of her vitals were completed HR 87 BP 144/78 O2 98. Patient then stated she hadn't taken her medicine (Toprolol) today that is due at lunch time. Patient was advised to take her medicine and if her heart rate continued to elevate to contact her doctor or call 911. Patient was taken out to car in wheel chair for fall precautions.   Immunizations Administered    Name Date Dose VIS Date Route   Pfizer COVID-19 Vaccine 07/27/2019 11:26 AM 0.3 mL 05/29/2019 Intramuscular   Manufacturer: [employer], [employer]   Lot: [id number]   NDC: [address]-2

## 2019-08-01 ENCOUNTER — Ambulatory Visit: Payer: Medicare Other

## 2019-08-22 ENCOUNTER — Ambulatory Visit: Payer: Medicare Other | Attending: Internal Medicine

## 2019-08-22 ENCOUNTER — Ambulatory Visit: Payer: Medicare Other

## 2019-08-22 ENCOUNTER — Other Ambulatory Visit: Payer: Self-pay

## 2019-08-22 DIAGNOSIS — Z23 Encounter for immunization: Secondary | ICD-10-CM | POA: Insufficient documentation

## 2019-08-22 NOTE — Progress Notes (Signed)
Covid-19 Vaccination Clinic  Name:  Alicia Moran    MRN: [id number] DOB: 1928-06-27  08/22/2019  Alicia Moran was observed post Covid-19 immunization for 30 minutes based on pre-vaccination screening without incident. She was provided with Vaccine Information Sheet and instruction to access the V-Safe system.   Alicia Moran was instructed to call 911 with any severe reactions post vaccine: Marland Kitchen Difficulty breathing  . Swelling of face and throat  . A fast heartbeat  . A bad rash all over body  . Dizziness and weakness   Immunizations Administered    Name Date Dose VIS Date Route   Pfizer COVID-19 Vaccine 08/22/2019 11:56 AM 0.3 mL 05/29/2019 Intramuscular   Manufacturer: [employer], [employer]   Lot: [id number]   NDC: [address]-1

## 2019-08-27 NOTE — Progress Notes (Signed)
[facility]  [phone] [phone]  ID: Alicia Moran OB: [birth year]  MR#: [id number]  JKK#:[id number]  [name], [name] (Internal Medicine) [name], [name] (Cardiology)  CHIEF COMPLAINT: Thrombocytopenia, iron deficiency anemia, MGUS.  INTERVAL HISTORY: Patient was last evaluated in clinic in November 2019.  She had missed several appointments in the interim and returns for repeat laboratory work and further evaluation.  She has chronic weakness and fatigue and insomnia, but otherwise feels well.  She has no neurologic complaints. She denies any recent fevers or illnesses. She has a good appetite and denies weight loss. She has no chest pain or shortness of breath. She denies any nausea, vomiting, constipation, or diarrhea. She has no melena or hematochezia. She has no urinary complaints.  Patient offers no further specific complaints today.  REVIEW OF SYSTEMS:   Review of Systems  Constitutional: Positive for malaise/fatigue. Negative for fever and weight loss.  Respiratory: Negative.  Negative for cough and shortness of breath.   Cardiovascular: Negative.  Negative for chest pain and leg swelling.  Gastrointestinal: Negative.  Negative for abdominal pain, blood in stool and melena.  Genitourinary: Negative.  Negative for hematuria.  Musculoskeletal: Negative.  Negative for back pain.  Skin: Negative.  Negative for rash.  Neurological: Positive for weakness. Negative for focal weakness and headaches.  Endo/Heme/Allergies: Negative.  Does not bruise/bleed easily.  Psychiatric/Behavioral: The patient has insomnia. The patient is not nervous/anxious.     As per HPI. Otherwise, a complete review of systems is negative.  PAST MEDICAL HISTORY: Past Medical History:  Diagnosis Date  . Atrial fibrillation (HCC)   . Hyperlipidemia   . Migraine headache   . Stroke [facility])     PAST  SURGICAL HISTORY: No past surgical history on file.  FAMILY HISTORY: Family History  Problem Relation Age of Onset  . Hypertension Mother   . Diabetes Mother   . Hypertension Father     ADVANCED DIRECTIVES (Y/N):  N  HEALTH MAINTENANCE: Social History   Tobacco Use  . Smoking status: Former Smoker    Quit date: 03/29/1986    Years since quitting: 33.4  . Smokeless tobacco: Never Used  Substance Use Topics  . Alcohol use: No  . Drug use: No     Colonoscopy:  PAP:  Bone density:  Lipid panel:  Allergies  Allergen Reactions  . Amoxicillin Diarrhea  . Erythromycin   . Nsaids Other (See Comments)    Other Reaction: GI ulcers    Current Outpatient Medications  Medication Sig Dispense Refill  . ALPRAZolam (XANAX) 0.25 MG tablet Take 0.25 mg by mouth at bedtime as needed for anxiety.    Marland Kitchen aspirin 81 MG tablet Take 81 mg by mouth daily.    . Cholecalciferol (EQL VITAMIN D3) 2000 units CAPS Take 1 capsule by mouth daily.    . Coenzyme Q10 (COQ10) 50 MG CAPS Take 1 capsule by mouth daily.    . famotidine (PEPCID) 10 MG tablet Take 10 mg by mouth as needed for heartburn or indigestion.    . metoprolol succinate (TOPROL-XL) 50 MG 24 hr tablet TAKE 1 TABLET EVERY DAY 90 tablet 0  . Multiple Vitamins-Minerals (PRESERVISION AREDS 2+MULTI VIT PO) Take 2 tablets by mouth daily.     . simvastatin (ZOCOR) 10 MG tablet TAKE 1 TABLET AT BEDTIME 90 tablet 3   No current facility-administered medications for this visit.  OBJECTIVE: Vitals:   08/31/19 1051  BP: (!) 169/83  Pulse: 70  Temp: (!) 97.5 F (36.4 C)     Body mass index is 22.68 kg/m.    ECOG FS:0 - Asymptomatic  General: Thin, no acute distress.  Sitting in a wheelchair. Eyes: Pink conjunctiva, anicteric sclera. HEENT: Normocephalic, moist mucous membranes. Lungs: No audible wheezing or coughing. Heart: Regular rate and rhythm. Abdomen: Soft, nontender, no obvious distention. Musculoskeletal: No edema,  cyanosis, or clubbing. Neuro: Alert, answering all questions appropriately. Cranial nerves grossly intact. Skin: No rashes or petechiae noted. Psych: Normal affect.  LAB RESULTS:  Lab Results  Component Value Date   NA 136 08/31/2019   K 3.8 08/31/2019   CL 106 08/31/2019   CO2 22 08/31/2019   GLUCOSE 106 (H) 08/31/2019   BUN 32 (H) 08/31/2019   CREATININE 0.80 08/31/2019   CALCIUM 8.9 08/31/2019   PROT 7.1 01/04/2017   ALBUMIN 4.3 01/04/2017   AST 17 01/04/2017   ALT 12 01/04/2017   ALKPHOS 69 01/04/2017   BILITOT 0.5 01/04/2017   GFRNONAA >60 08/31/2019   GFRAA >60 08/31/2019    Lab Results  Component Value Date   WBC 5.9 08/31/2019   NEUTROABS 2.1 08/31/2019   HGB 9.4 (L) 08/31/2019   HCT 28.1 (L) 08/31/2019   MCV 95.9 08/31/2019   PLT 107 (L) 08/31/2019   Lab Results  Component Value Date   IRON 36 05/13/2018   TIBC 262 05/13/2018   IRONPCTSAT 14 05/13/2018   Lab Results  Component Value Date   FERRITIN 116 05/06/2018   Lab Results  Component Value Date   TOTALPROTELP 7.1 05/06/2018   ALBUMINELP 4.1 05/06/2018   A1GS 0.2 05/06/2018   A2GS 0.6 05/06/2018   BETS 0.7 05/06/2018   GAMS 1.5 05/06/2018   MSPIKE 1.2 (H) 05/06/2018   SPEI Comment 05/06/2018   Lab Results  Component Value Date   CEA 1.9 05/30/2016     STUDIES: No results found.  ASSESSMENT: Thrombocytopenia, Iron deficiency anemia, MGUS  PLAN:    1. MGUS: Patient's most recent M spike on May 06, 2018 was reported 1.2.  Today's result is pending.  She has an elevated IgM level of 1462 with a mildly decreased IgG.  Her kappa/lambda free light chain ratio is elevated at 16.79.  Repeat results are also pending at time of dictation.  She continues to have a mild anemia and thrombocytopenia, but no other evidence of endorgan damage.  If she in fact progressed to multiple myeloma, given her decreased performance status and age treatment will be difficult.  She does not require a bone  marrow biopsy.  She does not require routine monitoring of SPEP.  Patient has requested no further follow-up.  2. Thrombocytopenia: Platelets have trended down slightly to 107.  Patient has requested to have her CBC monitored by primary care.  No follow-up as above. 3. Iron deficiency anemia: Hemoglobin has trended down slightly to 9.4.  Previously the remainder of her laboratory work including iron stores was either negative or within normal limits.  Follow-up with primary care as above.   4. History of heme positive stools: Patient declined any further colonoscopies.  Previously, CEA was within normal limits. 5.  Insomnia: Patient states she will discuss this further with her primary care physician.  Patient expressed understanding and was in agreement with this plan. She also understands that She can call clinic at any time with any questions, concerns, or complaints.    Marcial Pacas  Celedonio Miyamoto, MD   08/31/2019 11:34 AM

## 2019-08-28 ENCOUNTER — Other Ambulatory Visit: Payer: Self-pay | Admitting: *Deleted

## 2019-08-28 DIAGNOSIS — D696 Thrombocytopenia, unspecified: Secondary | ICD-10-CM

## 2019-08-28 DIAGNOSIS — D472 Monoclonal gammopathy: Secondary | ICD-10-CM

## 2019-08-28 DIAGNOSIS — D509 Iron deficiency anemia, unspecified: Secondary | ICD-10-CM

## 2019-08-31 ENCOUNTER — Inpatient Hospital Stay (HOSPITAL_BASED_OUTPATIENT_CLINIC_OR_DEPARTMENT_OTHER): Payer: Medicare Other | Admitting: Oncology

## 2019-08-31 ENCOUNTER — Other Ambulatory Visit: Payer: Self-pay

## 2019-08-31 ENCOUNTER — Inpatient Hospital Stay: Payer: Medicare Other | Attending: Oncology

## 2019-08-31 ENCOUNTER — Encounter: Payer: Self-pay | Admitting: Oncology

## 2019-08-31 VITALS — BP 169/83 | HR 70 | Temp 97.5°F | Wt 124.0 lb

## 2019-08-31 DIAGNOSIS — I4891 Unspecified atrial fibrillation: Secondary | ICD-10-CM | POA: Insufficient documentation

## 2019-08-31 DIAGNOSIS — Z8673 Personal history of transient ischemic attack (TIA), and cerebral infarction without residual deficits: Secondary | ICD-10-CM | POA: Insufficient documentation

## 2019-08-31 DIAGNOSIS — D472 Monoclonal gammopathy: Secondary | ICD-10-CM | POA: Insufficient documentation

## 2019-08-31 DIAGNOSIS — Z79899 Other long term (current) drug therapy: Secondary | ICD-10-CM | POA: Insufficient documentation

## 2019-08-31 DIAGNOSIS — D696 Thrombocytopenia, unspecified: Secondary | ICD-10-CM | POA: Diagnosis not present

## 2019-08-31 DIAGNOSIS — E785 Hyperlipidemia, unspecified: Secondary | ICD-10-CM | POA: Diagnosis not present

## 2019-08-31 DIAGNOSIS — D509 Iron deficiency anemia, unspecified: Secondary | ICD-10-CM | POA: Insufficient documentation

## 2019-08-31 DIAGNOSIS — Z87891 Personal history of nicotine dependence: Secondary | ICD-10-CM | POA: Insufficient documentation

## 2019-08-31 DIAGNOSIS — Z7982 Long term (current) use of aspirin: Secondary | ICD-10-CM | POA: Diagnosis not present

## 2019-08-31 DIAGNOSIS — G4700 Insomnia, unspecified: Secondary | ICD-10-CM | POA: Diagnosis not present

## 2019-08-31 DIAGNOSIS — R531 Weakness: Secondary | ICD-10-CM | POA: Insufficient documentation

## 2019-08-31 DIAGNOSIS — R5382 Chronic fatigue, unspecified: Secondary | ICD-10-CM | POA: Insufficient documentation

## 2019-08-31 LAB — CBC WITH DIFFERENTIAL/PLATELET
Abs Immature Granulocytes: 0.01 10*3/uL (ref 0.00–0.07)
Basophils Absolute: 0 10*3/uL (ref 0.0–0.1)
Basophils Relative: 1 %
Eosinophils Absolute: 0 10*3/uL (ref 0.0–0.5)
Eosinophils Relative: 1 %
HCT: 28.1 % — ABNORMAL LOW (ref 36.0–46.0)
Hemoglobin: 9.4 g/dL — ABNORMAL LOW (ref 12.0–15.0)
Immature Granulocytes: 0 %
Lymphocytes Relative: 57 %
Lymphs Abs: 3.5 10*3/uL (ref 0.7–4.0)
MCH: 32.1 pg (ref 26.0–34.0)
MCHC: 33.5 g/dL (ref 30.0–36.0)
MCV: 95.9 fL (ref 80.0–100.0)
Monocytes Absolute: 0.3 10*3/uL (ref 0.1–1.0)
Monocytes Relative: 5 %
Neutro Abs: 2.1 10*3/uL (ref 1.7–7.7)
Neutrophils Relative %: 36 %
Platelets: 107 10*3/uL — ABNORMAL LOW (ref 150–400)
RBC: 2.93 MIL/uL — ABNORMAL LOW (ref 3.87–5.11)
RDW: 13.9 % (ref 11.5–15.5)
WBC: 5.9 10*3/uL (ref 4.0–10.5)
nRBC: 0 % (ref 0.0–0.2)

## 2019-08-31 LAB — BASIC METABOLIC PANEL
Anion gap: 8 (ref 5–15)
BUN: 32 mg/dL — ABNORMAL HIGH (ref 8–23)
CO2: 22 mmol/L (ref 22–32)
Calcium: 8.9 mg/dL (ref 8.9–10.3)
Chloride: 106 mmol/L (ref 98–111)
Creatinine, Ser: 0.8 mg/dL (ref 0.44–1.00)
GFR calc Af Amer: >60 mL/min (ref >60)
GFR calc non Af Amer: >60 mL/min (ref >60)
Glucose, Bld: 106 mg/dL — ABNORMAL HIGH (ref 70–99)
Potassium: 3.8 mmol/L (ref 3.5–5.1)
Sodium: 136 mmol/L (ref 135–145)

## 2019-08-31 LAB — IRON AND TIBC
Iron: 46 ug/dL (ref 28–170)
Saturation Ratios: 17 % (ref 10.4–31.8)
TIBC: 279 ug/dL (ref 250–450)
UIBC: 233 ug/dL

## 2019-08-31 LAB — FERRITIN: Ferritin: 71 ng/mL (ref 11–307)

## 2019-09-01 LAB — IGG, IGA, IGM
IgA: 48 mg/dL — ABNORMAL LOW (ref 64–422)
IgG (Immunoglobin G), Serum: 507 mg/dL — ABNORMAL LOW (ref 586–1602)
IgM (Immunoglobulin M), Srm: 1254 mg/dL — ABNORMAL HIGH (ref 26–217)

## 2019-09-01 LAB — KAPPA/LAMBDA LIGHT CHAINS
Kappa free light chain: 181.9 mg/L — ABNORMAL HIGH (ref 3.3–19.4)
Kappa, lambda light chain ratio: 22.74 — ABNORMAL HIGH (ref 0.26–1.65)
Lambda free light chains: 8 mg/L (ref 5.7–26.3)

## 2019-09-02 LAB — PROTEIN ELECTROPHORESIS, SERUM
A/G Ratio: 1.4 (ref 0.7–1.7)
Albumin ELP: 3.7 g/dL (ref 2.9–4.4)
Alpha-1-Globulin: 0.2 g/dL (ref 0.0–0.4)
Alpha-2-Globulin: 0.6 g/dL (ref 0.4–1.0)
Beta Globulin: 0.7 g/dL (ref 0.7–1.3)
Gamma Globulin: 1.2 g/dL (ref 0.4–1.8)
Globulin, Total: 2.7 g/dL (ref 2.2–3.9)
M-Spike, %: 0.9 g/dL — ABNORMAL HIGH
Total Protein ELP: 6.4 g/dL (ref 6.0–8.5)

## 2019-09-10 ENCOUNTER — Ambulatory Visit (INDEPENDENT_AMBULATORY_CARE_PROVIDER_SITE_OTHER): Payer: Medicare Other | Admitting: Cardiovascular Disease

## 2019-09-10 ENCOUNTER — Ambulatory Visit (INDEPENDENT_AMBULATORY_CARE_PROVIDER_SITE_OTHER): Payer: Medicare Other

## 2019-09-10 ENCOUNTER — Encounter: Payer: Self-pay | Admitting: Cardiovascular Disease

## 2019-09-10 ENCOUNTER — Other Ambulatory Visit: Payer: Self-pay

## 2019-09-10 VITALS — BP 100/74 | HR 72 | Ht 62.0 in | Wt 128.4 lb

## 2019-09-10 DIAGNOSIS — I872 Venous insufficiency (chronic) (peripheral): Secondary | ICD-10-CM | POA: Diagnosis not present

## 2019-09-10 DIAGNOSIS — R0602 Shortness of breath: Secondary | ICD-10-CM

## 2019-09-10 DIAGNOSIS — I340 Nonrheumatic mitral (valve) insufficiency: Secondary | ICD-10-CM

## 2019-09-10 DIAGNOSIS — I10 Essential (primary) hypertension: Secondary | ICD-10-CM | POA: Diagnosis not present

## 2019-09-10 NOTE — Progress Notes (Signed)
Cardiology Office Note   Date:  09/10/2019   ID:  Alicia Moran, DOB [birth year], MRN [id number]  PCP:  [name], MD  Cardiologist:   Lorine Bears, MD   Chief Complaint  Patient presents with  . OTHER    2 month f/u echo. Meds reviewed verbally with pt.      History of Present Illness: Alicia Moran is a 86 y.o. female who Is here today for a follow-up visit regarding mild to moderate valvular disease. Paroxysmal atrial fibrillation is mentioned in her chart. However, after extensive review of her chart from [facility], I did not find conclusive evidence of atrial fibrillation. It appears that in 2008 she suffered from a stroke in spite of being on Plavix. Thus, she was started on anticoagulation with warfarin at that time for that indication.  She suffered from subdural hematoma in 2015 after a fall and thus warfarin was discontinued at that time and was not resumed.  she had previous cardiac catheterization at [facility] in 2006 with no obstructive coronary artery disease.   Her daughter died in 01-01-2019and since then the patient had significant depression and grief.  Most recent echocardiogram in February of 2019 showed normal ejection fraction, sclerotic aortic valve without stenosis, mild aortic regurgitation, mild mitral regurgitation and mild to moderate tricuspid regurgitation. She lives by herself and does not drive but she gets help from her niece. She underwent an echocardiogram today which was personally reviewed by me and showed normal LV systolic function, mild aortic stenosis with mild regurgitation, moderate mitral regurgitation and mild tricuspid regurgitation.  Pulmonary pressure was close to normal. She continues to struggle with depression.  In addition, she is having issues with her vision due to macular degeneration and seems to be stressed about that.  She also reports bilateral leg edema worse on the left side.     Past Medical History:  Diagnosis  Date  . Atrial fibrillation (HCC)   . Hyperlipidemia   . Migraine headache   . Stroke [facility])     History reviewed. No pertinent surgical history.   Current Outpatient Medications  Medication Sig Dispense Refill  . Acetaminophen (TYLENOL EXTRA STRENGTH PO) Take by mouth as needed.    . ALPRAZolam (XANAX) 0.25 MG tablet Take 0.25 mg by mouth at bedtime as needed for anxiety.    Marland Kitchen aspirin 81 MG tablet Take 81 mg by mouth daily.    . Cholecalciferol (EQL VITAMIN D3) 2000 units CAPS Take 1 capsule by mouth daily.    . Coenzyme Q10 (COQ10) 50 MG CAPS Take 1 capsule by mouth daily.    . famotidine (PEPCID) 10 MG tablet Take 10 mg by mouth as needed for heartburn or indigestion.    . metoprolol succinate (TOPROL-XL) 50 MG 24 hr tablet TAKE 1 TABLET EVERY DAY 90 tablet 0  . Multiple Vitamins-Minerals (PRESERVISION AREDS 2+MULTI VIT PO) Take 2 tablets by mouth daily.     . NON FORMULARY Cosamine 1 capsule qd.    . simvastatin (ZOCOR) 10 MG tablet TAKE 1 TABLET AT BEDTIME 90 tablet 3   No current facility-administered medications for this visit.    Allergies:   Amoxicillin, Erythromycin, and Nsaids    Social History:  The patient  reports that she quit smoking about 33 years ago. She has never used smokeless tobacco. She reports that she does not drink alcohol or use drugs.   Family History:  The patient's family history includes Diabetes in  her mother; Hypertension in her father and mother.    ROS:  Please see the history of present illness.   Otherwise, review of systems are positive for none.   All other systems are reviewed and negative.    PHYSICAL EXAM: VS:  BP 100/74 (BP Location: Right Arm, Patient Position: Sitting, Cuff Size: Normal)   Pulse 72   Ht 5\' 2"  (1.575 m)   Wt 128 lb 6 oz (58.2 kg)   SpO2 98%   BMI 23.48 kg/m  , BMI Body mass index is 23.48 kg/m. GEN: Well nourished, well developed, in no acute distress  HEENT: normal  Neck: no JVD, carotid bruits, or masses  Cardiac: RRR; no rubs, or gallops . 2/6 systolic murmur in the aortic area. 2/6 holosystolic murmur at the apex.  Mild bilateral leg edema worse on the left side Respiratory:  clear to auscultation bilaterally, normal work of breathing GI: soft, nontender, nondistended, + BS MS: no deformity or atrophy  Skin: warm and dry, no rash Neuro:  Strength and sensation are intact Psych: euthymic mood, full affect   EKG:  EKG is ordered today. The ekg ordered today demonstrates normal sinus rhythm with no significant ST or T wave changes   Recent Labs: 08/31/2019: BUN 32; Creatinine, Ser 0.80; Hemoglobin 9.4; Platelets 107; Potassium 3.8; Sodium 136    Lipid Panel    Component Value Date/Time   CHOL 121 08/26/2015 0941   TRIG 48.0 08/26/2015 0941   HDL 52.00 08/26/2015 0941   CHOLHDL 2 08/26/2015 0941   VLDL 9.6 08/26/2015 0941   LDLCALC 60 08/26/2015 0941      Wt Readings from Last 3 Encounters:  09/10/19 128 lb 6 oz (58.2 kg)  08/31/19 124 lb (56.2 kg)  05/28/19 128 lb (58.1 kg)        ASSESSMENT AND PLAN:  1.  Moderate valvular heart disease: Echo overall showed relatively stable findings with moderate mitral regurgitation, mild aortic stenosis and mild tricuspid regurgitation no evidence of significant pulmonary hypertension.    2. Previous history of stroke: No documented history of atrial fibrillation. Continue low-dose aspirin.   3. Essential hypertension: Blood pressure is reasonably controlled on Toprol.  Blood pressure is on the low side but she is not dizzy and has no symptoms.  Her blood pressure tends to be high.  4.  Grief and depression: She did not respond to multiple medications.  This is being managed by Dr. Arlana Pouch.  5.  Bilateral leg edema: Likely due to chronic venous insufficiency.  I advised her to elevate her legs during the day.  She should consider knee-high support stockings but report difficulty placing them.  The patient might require cataract  surgery and explained to her that there is no contraindication from a cardiac standpoint.   Disposition:   FU with me in 12 months.   Signed,  Lorine Bears, MD  09/10/2019 10:46 AM    [facility]

## 2019-09-10 NOTE — Patient Instructions (Signed)

## 2019-09-11 ENCOUNTER — Other Ambulatory Visit: Payer: Self-pay | Admitting: Cardiovascular Disease

## 2019-11-06 ENCOUNTER — Other Ambulatory Visit: Payer: Self-pay | Admitting: Cardiovascular Disease

## 2020-01-04 ENCOUNTER — Other Ambulatory Visit: Payer: Self-pay

## 2020-03-07 ENCOUNTER — Ambulatory Visit: Payer: Medicare Other | Admitting: Oncology

## 2020-03-07 ENCOUNTER — Other Ambulatory Visit: Payer: Medicare Other

## 2020-03-15 ENCOUNTER — Other Ambulatory Visit: Payer: Self-pay

## 2020-03-15 ENCOUNTER — Ambulatory Visit (INDEPENDENT_AMBULATORY_CARE_PROVIDER_SITE_OTHER): Payer: Medicare Other | Admitting: Gastroenterology

## 2020-03-15 ENCOUNTER — Encounter: Payer: Self-pay | Admitting: Gastroenterology

## 2020-03-15 VITALS — BP 156/73 | HR 78 | Temp 98.1°F | Ht 62.0 in | Wt 133.8 lb

## 2020-03-15 DIAGNOSIS — K5904 Chronic idiopathic constipation: Secondary | ICD-10-CM | POA: Diagnosis not present

## 2020-03-15 DIAGNOSIS — M719 Bursopathy, unspecified: Secondary | ICD-10-CM | POA: Insufficient documentation

## 2020-03-15 NOTE — Progress Notes (Signed)
Gastroenterology Consultation  Referring Provider:     Albina Billet, MD Primary Care Physician:  Albina Billet, MD Primary Gastroenterologist:  Dr. Allen Norris     Reason for Consultation:     Constipation        HPI:   Alicia Moran is a 86 y.o. y/o female referred for consultation & management of Constipation by Dr. Hall Busing, Leona Carry, MD.  This patient comes in today with a history of constipation.  She describes her constipation as having stools that are soft round balls that will not come out and she has to manually taken out with her finger.  The patient reports that she does not have any hard stools but hasn't difficulty passing them.  She also states that she did do a cologuard test a few years ago and met up with Dr. Vira Agar and at that time she found out her cologuard was positive and the decision was made not to proceed with any endoscopic procedures.  The patient is not having any change in bowel habits but continues to have this chronic problem.  She has lost a daughter and husband recently and states that she is been under a lot of stress. She denies any unexplained weight loss.  She also denies any change in her diet or any sign of early satiety.   Past Medical History:  Diagnosis Date  . Atrial fibrillation ([place])   . Hyperlipidemia   . Migraine headache   . Stroke [facility])     No past surgical history on file.  Prior to Admission medications   Medication Sig Start Date End Date Taking? Authorizing Provider  Acetaminophen (TYLENOL EXTRA STRENGTH PO) Take by mouth as needed.   Yes [provider]  ALPRAZolam (XANAX) 0.25 MG tablet Take 0.25 mg by mouth at bedtime as needed for anxiety.   Yes [provider]  aspirin 81 MG tablet Take 81 mg by mouth daily.   Yes [provider]  Cholecalciferol (EQL VITAMIN D3) 2000 units CAPS Take 1 capsule by mouth daily.   Yes [provider]  Coenzyme Q10 (COQ10) 50 MG CAPS Take 1 capsule by mouth daily.    Yes [provider]  famotidine (PEPCID) 10 MG tablet Take 10 mg by mouth as needed for heartburn or indigestion.   Yes [provider]  metoprolol succinate (TOPROL-XL) 50 MG 24 hr tablet TAKE 1 TABLET EVERY DAY 11/06/19  Yes Wellington Hampshire, MD  NON FORMULARY Cosamine 1 capsule qd.   Yes [provider]  simvastatin (ZOCOR) 10 MG tablet TAKE 1 TABLET AT BEDTIME 11/05/16  Yes Crecencio Mc, MD  Multiple Vitamins-Minerals (PRESERVISION AREDS 2+MULTI VIT PO) Take 2 tablets by mouth daily.  Patient not taking: Reported on 03/15/2020    [provider]  sertraline (ZOLOFT) 25 MG tablet SMARTSIG:1 Tablet(s) By Mouth Every Evening Patient not taking: Reported on 03/15/2020 02/11/20   [provider]    Family History  Problem Relation Age of Onset  . Hypertension Mother   . Diabetes Mother   . Hypertension Father      Social History   Tobacco Use  . Smoking status: Former Smoker    Quit date: 03/29/1986    Years since quitting: 33.9  . Smokeless tobacco: Never Used  Substance Use Topics  . Alcohol use: No  . Drug use: No    Allergies as of 03/15/2020 - Review Complete 03/15/2020  Allergen Reaction Noted  . Amoxicillin  Diarrhea 03/30/2011  . Erythromycin  06/29/2011  . Nsaids Other (See Comments)     Review of Systems:    All systems reviewed and negative except where noted in HPI.   Physical Exam:  BP (!) 156/73 (BP Location: Left Arm, Patient Position: Sitting, Cuff Size: Normal)   Pulse 78   Temp 98.1 F (36.7 C) (Oral)   Ht 5' 2" (1.575 m)   Wt 133 lb 12.8 oz (60.7 kg)   BMI 24.47 kg/m  No LMP recorded. Patient is postmenopausal. General:   Alert,  Well-developed, well-nourished, pleasant and cooperative in NAD Head:  Normocephalic and atraumatic. Eyes:  Sclera clear, no icterus.   Conjunctiva pink. Ears:  Normal auditory acuity. Neck:  Supple; no masses or thyromegaly. Lungs:  Respirations even and unlabored.  Clear  throughout to auscultation.   No wheezes, crackles, or rhonchi. No acute distress. Heart:  Regular rate and rhythm; no murmurs, clicks, rubs, or gallops. Abdomen:  Normal bowel sounds.  No bruits.  Soft, non-tender and non-distended without masses, hepatosplenomegaly or hernias noted.  No guarding or rebound tenderness.  Negative Carnett sign.   Rectal:  Rectal tone is normal without any masses or external and internal hemorrhoids noted. Pulses:  Normal pulses noted. Extremities:  No clubbing or edema.  No cyanosis. Neurologic:  Alert and oriented x3;  grossly normal neurologically. Skin:  Intact without significant lesions or rashes.  No jaundice. Lymph Nodes:  No significant cervical adenopathy. Psych:  Alert and cooperative. Normal mood and affect.  Imaging Studies: No results found.  Assessment and Plan:   Alicia Moran is a 86 y.o. y/o female who comes in with trouble moving her bowels.  She has to manually disimpact herself but not because of hard stools but because of soft stools that are small balls. The patient rectal exam did not show any sign of an obstruction or narrowing.  The patient was concerned that she may have narrowing due to her inability to pass her stool and because of previous hemorrhoid surgery.  There appeared to be no strictures or scarring to explain her symptoms.  The patient has been told to take fiber supplements twice a day to bulk up her stool to make it more easily for her to pass her stools.  She has also been told to contact me if her symptoms do not improve and then we can decide if any further workup is needed.  The patient has been by the plan and agrees with it.    Darren Wohl, MD. FACG    Note: This dictation was prepared with Dragon dictation along with smaller phrase technology. Any transcriptional errors that result from this process are unintentional.   

## 2020-05-31 ENCOUNTER — Other Ambulatory Visit: Payer: Self-pay | Admitting: Neurology

## 2020-05-31 DIAGNOSIS — G43019 Migraine without aura, intractable, without status migrainosus: Secondary | ICD-10-CM

## 2020-06-08 ENCOUNTER — Other Ambulatory Visit: Payer: Self-pay | Admitting: Neurology

## 2020-06-08 DIAGNOSIS — M542 Cervicalgia: Secondary | ICD-10-CM

## 2020-06-13 ENCOUNTER — Telehealth: Payer: Self-pay | Admitting: Cardiovascular Disease

## 2020-06-13 MED ORDER — SIMVASTATIN 10 MG PO TABS: 10.0000 mg | ORAL_TABLET | Freq: Every day | ORAL | 1 refills | Status: DC

## 2020-06-13 NOTE — Telephone Encounter (Signed)
Received fax from [facility] requesting refill for simvastatin 10 mg. Rx request sent to pharmacy.

## 2020-06-14 ENCOUNTER — Ambulatory Visit
Admission: RE | Admit: 2020-06-14 | Discharge: 2020-06-14 | Disposition: A | Discharge status: Home / Self Care | Payer: Medicare Other | Source: Ambulatory Visit | Attending: Neurology | Admitting: Neurology

## 2020-06-14 ENCOUNTER — Other Ambulatory Visit: Payer: Self-pay

## 2020-06-14 DIAGNOSIS — G43019 Migraine without aura, intractable, without status migrainosus: Secondary | ICD-10-CM | POA: Insufficient documentation

## 2020-06-14 DIAGNOSIS — M542 Cervicalgia: Secondary | ICD-10-CM | POA: Insufficient documentation

## 2020-07-07 ENCOUNTER — Other Ambulatory Visit: Payer: Self-pay | Admitting: Cardiovascular Disease

## 2020-07-07 NOTE — Telephone Encounter (Signed)
Rx request sent to pharmacy.  

## 2020-09-22 ENCOUNTER — Ambulatory Visit: Payer: Medicare Other | Admitting: Family

## 2020-09-22 ENCOUNTER — Ambulatory Visit: Payer: Medicare Other | Admitting: Cardiovascular Disease

## 2020-10-13 ENCOUNTER — Ambulatory Visit (INDEPENDENT_AMBULATORY_CARE_PROVIDER_SITE_OTHER): Payer: Medicare Other | Admitting: Cardiovascular Disease

## 2020-10-13 ENCOUNTER — Encounter: Payer: Self-pay | Admitting: Cardiovascular Disease

## 2020-10-13 ENCOUNTER — Other Ambulatory Visit: Payer: Self-pay

## 2020-10-13 VITALS — BP 130/60 | HR 74 | Ht 62.0 in | Wt 133.0 lb

## 2020-10-13 DIAGNOSIS — I071 Rheumatic tricuspid insufficiency: Secondary | ICD-10-CM

## 2020-10-13 DIAGNOSIS — I340 Nonrheumatic mitral (valve) insufficiency: Secondary | ICD-10-CM | POA: Diagnosis not present

## 2020-10-13 DIAGNOSIS — Z8673 Personal history of transient ischemic attack (TIA), and cerebral infarction without residual deficits: Secondary | ICD-10-CM

## 2020-10-13 DIAGNOSIS — I350 Nonrheumatic aortic (valve) stenosis: Secondary | ICD-10-CM | POA: Diagnosis not present

## 2020-10-13 NOTE — Patient Instructions (Addendum)
Medication Instructions:  Your physician recommends that you continue on your current medications as directed. Please refer to the Current Medication list given to you today.  *If you need a refill on your cardiac medications before your next appointment, please call your pharmacy*   Lab Work: None ordered If you have labs (blood work) drawn today and your tests are completely normal, you will receive your results only by: Marland Kitchen MyChart Message (if you have MyChart) OR . A paper copy in the mail If you have any lab test that is abnormal or we need to change your treatment, we will call you to review the results.   Testing/Procedures: None ordered   Follow-Up: At [facility], you and your health needs are our priority.  As part of our continuing mission to provide you with exceptional heart care, we have created designated Provider Care Teams.  These Care Teams include your primary Cardiologist (physician) and Advanced Practice Providers (APPs -  Physician Assistants and Nurse Practitioners) who all work together to provide you with the care you need, when you need it.  We recommend signing up for the patient portal called "MyChart".  Sign up information is provided on this After Visit Summary.  MyChart is used to connect with patients for Virtual Visits (Telemedicine).  Patients are able to view lab/test results, encounter notes, upcoming appointments, etc.  Non-urgent messages can be sent to your provider as well.   To learn more about what you can do with MyChart, go to [email or web address].    Your next appointment:   Your physician wants you to follow-up in: 12 months You will receive a reminder letter in the mail two months in advance. If you don't receive a letter, please call our office to schedule the follow-up appointment.   The format for your next appointment:   In Person  Provider:   You may see Kathlyn Sacramento, MD or one of the following Advanced Practice Providers on  your designated Care Team:    Murray Hodgkins, NP  Christell Faith, PA-C  Marrianne Mood, PA-C  Cadence Perrin, [place]  Laurann Montana, NP    Other Instructions N/A

## 2020-10-13 NOTE — Progress Notes (Signed)
Cardiology Office Note   Date:  10/13/2020   ID:  KHILA YELLOCK, DOB 1928/11/21, MRN [id number]  PCP:  [name], MD  Cardiologist:   Lorine Bears, MD   Chief Complaint  Patient presents with  . Follow-up    12 month f/u c/o no energy. Meds reviewed verbally with pt.      History of Present Illness: Alicia Moran is a 87 y.o. female who Is here today for a follow-up visit regarding mild to moderate valvular disease. Paroxysmal atrial fibrillation is mentioned in her chart. However, after extensive review of her chart from [facility], I did not find conclusive evidence of atrial fibrillation. It appears that in 2008 she suffered from a stroke in spite of being on Plavix. Thus, she was started on anticoagulation with warfarin at that time for that indication.  She suffered from subdural hematoma in 2015 after a fall and thus warfarin was discontinued at that time and was not resumed.  she had previous cardiac catheterization at [facility] in 2006 with no obstructive coronary artery disease.   Her daughter died in 01-11-19and since then the patient had significant depression and grief.  Most recent echocardiogram in March 2021 showed normal LV systolic function, mild aortic stenosis with mild regurgitation, moderate mitral regurgitation and mild tricuspid regurgitation.  There was mild pulmonary hypertension.   She has been doing reasonably well from a cardiac standpoint.  No chest pain.  She does report stable exertional dyspnea.  She continues to have depression.  She lives by herself but she does have an aide.     Past Medical History:  Diagnosis Date  . Atrial fibrillation (H[facility])   . Hyperlipidemia   . Migraine headache   . Stroke [facility])     History reviewed. No pertinent surgical history.   Current Outpatient Medications  Medication Sig Dispense Refill  . Acetaminophen (TYLENOL EXTRA STRENGTH PO) Take by mouth as needed.    . ALPRAZolam (XANAX) 0.25 MG tablet  Take 0.25 mg by mouth at bedtime as needed for anxiety.    Marland Kitchen aspirin 81 MG tablet Take 81 mg by mouth daily.    . Cholecalciferol 50 MCG (2000 UT) CAPS Take 1 capsule by mouth daily.    . Coenzyme Q10 (COQ10) 50 MG CAPS Take 1 capsule by mouth daily.    . famotidine (PEPCID) 10 MG tablet Take 10 mg by mouth as needed for heartburn or indigestion.    . metoprolol succinate (TOPROL-XL) 50 MG 24 hr tablet TAKE 1 TABLET EVERY DAY 90 tablet 2  . Multiple Vitamins-Minerals (PRESERVISION AREDS 2+MULTI VIT PO) Take 2 tablets by mouth daily.    . NON FORMULARY Cosamine 1 capsule qd.    . simvastatin (ZOCOR) 10 MG tablet Take 1 tablet (10 mg total) by mouth at bedtime. 90 tablet 1   No current facility-administered medications for this visit.    Allergies:   Amoxicillin, Erythromycin, and Nsaids    Social History:  The patient  reports that she quit smoking about 34 years ago. She has never used smokeless tobacco. She reports that she does not drink alcohol and does not use drugs.   Family History:  The patient's family history includes Diabetes in her mother; Hypertension in her father and mother.    ROS:  Please see the history of present illness.   Otherwise, review of systems are positive for none.   All other systems are reviewed and negative.  PHYSICAL EXAM: VS:  BP 130/60 (BP Location: Left Arm, Patient Position: Sitting, Cuff Size: Normal)   Pulse 74   Ht 5\' 2"  (1.575 m)   Wt 133 lb (60.3 kg)   SpO2 97%   BMI 24.33 kg/m  , BMI Body mass index is 24.33 kg/m. GEN: Well nourished, well developed, in no acute distress  HEENT: normal  Neck: no JVD, carotid bruits, or masses Cardiac: RRR; no rubs, or gallops . 2/6 systolic murmur in the aortic area. 2/6 holosystolic murmur at the apex.  Mild bilateral leg edema worse on the left side Respiratory:  clear to auscultation bilaterally, normal work of breathing GI: soft, nontender, nondistended, + BS MS: no deformity or atrophy  Skin:  warm and dry, no rash Neuro:  Strength and sensation are intact Psych: euthymic mood, full affect   EKG:  EKG is ordered today. The ekg ordered today demonstrates normal sinus rhythm with no significant ST or T wave changes   Recent Labs: No results found for requested labs within last 8760 hours.    Lipid Panel    Component Value Date/Time   CHOL 121 08/26/2015 0941   TRIG 48.0 08/26/2015 0941   HDL 52.00 08/26/2015 0941   CHOLHDL 2 08/26/2015 0941   VLDL 9.6 08/26/2015 0941   LDLCALC 60 08/26/2015 0941      Wt Readings from Last 3 Encounters:  10/13/20 133 lb (60.3 kg)  03/15/20 133 lb 12.8 oz (60.7 kg)  09/10/19 128 lb 6 oz (58.2 kg)        ASSESSMENT AND PLAN:  1.  Moderate valvular heart disease: Echo last year showed relatively stable findings with moderate mitral regurgitation, mild aortic stenosis and mild tricuspid regurgitation with minimal pulmonary hypertension.  The plan is to repeat echocardiogram next year.  2. Previous history of stroke: No documented history of atrial fibrillation. Continue low-dose aspirin.   3. Essential hypertension: Blood pressure is reasonably controlled on Toprol.   4.  Grief and depression: She did not respond to multiple medications.  This is being managed by Dr. Arlana Pouch.  5.  Bilateral leg edema: Likely due to chronic venous insufficiency.     Disposition:   FU with me in 12 months.   Signed,  Lorine Bears, MD  10/13/2020 1:30 PM    [facility]

## 2020-11-15 ENCOUNTER — Other Ambulatory Visit: Payer: Self-pay | Admitting: Cardiovascular Disease

## 2021-01-29 ENCOUNTER — Encounter: Payer: Self-pay | Admitting: Family Medicine

## 2021-01-29 ENCOUNTER — Other Ambulatory Visit: Payer: Self-pay

## 2021-01-29 ENCOUNTER — Inpatient Hospital Stay
Admission: EM | Admit: 2021-01-29 | Discharge: 2021-02-02 | DRG: 372 | Disposition: A | Discharge status: Skilled Nursing Facility | Payer: Medicare Other | Attending: Student | Admitting: Student

## 2021-01-29 ENCOUNTER — Emergency Department: Payer: Medicare Other

## 2021-01-29 DIAGNOSIS — M25561 Pain in right knee: Secondary | ICD-10-CM

## 2021-01-29 DIAGNOSIS — E860 Dehydration: Secondary | ICD-10-CM

## 2021-01-29 DIAGNOSIS — E440 Moderate protein-calorie malnutrition: Secondary | ICD-10-CM | POA: Diagnosis present

## 2021-01-29 DIAGNOSIS — R531 Weakness: Secondary | ICD-10-CM | POA: Diagnosis not present

## 2021-01-29 DIAGNOSIS — T8484XA Pain due to internal orthopedic prosthetic devices, implants and grafts, initial encounter: Secondary | ICD-10-CM | POA: Diagnosis present

## 2021-01-29 DIAGNOSIS — A0472 Enterocolitis due to Clostridium difficile, not specified as recurrent: Secondary | ICD-10-CM | POA: Diagnosis not present

## 2021-01-29 DIAGNOSIS — Z8673 Personal history of transient ischemic attack (TIA), and cerebral infarction without residual deficits: Secondary | ICD-10-CM

## 2021-01-29 DIAGNOSIS — Z886 Allergy status to analgesic agent status: Secondary | ICD-10-CM

## 2021-01-29 DIAGNOSIS — I10 Essential (primary) hypertension: Secondary | ICD-10-CM | POA: Diagnosis present

## 2021-01-29 DIAGNOSIS — Z880 Allergy status to penicillin: Secondary | ICD-10-CM

## 2021-01-29 DIAGNOSIS — H548 Legal blindness, as defined in USA: Secondary | ICD-10-CM | POA: Diagnosis present

## 2021-01-29 DIAGNOSIS — Z881 Allergy status to other antibiotic agents status: Secondary | ICD-10-CM

## 2021-01-29 DIAGNOSIS — A09 Infectious gastroenteritis and colitis, unspecified: Secondary | ICD-10-CM | POA: Diagnosis not present

## 2021-01-29 DIAGNOSIS — Y792 Prosthetic and other implants, materials and accessory orthopedic devices associated with adverse incidents: Secondary | ICD-10-CM | POA: Diagnosis present

## 2021-01-29 DIAGNOSIS — Z20822 Contact with and (suspected) exposure to covid-19: Secondary | ICD-10-CM | POA: Diagnosis present

## 2021-01-29 DIAGNOSIS — F419 Anxiety disorder, unspecified: Secondary | ICD-10-CM | POA: Diagnosis present

## 2021-01-29 DIAGNOSIS — Z7982 Long term (current) use of aspirin: Secondary | ICD-10-CM

## 2021-01-29 DIAGNOSIS — Z833 Family history of diabetes mellitus: Secondary | ICD-10-CM

## 2021-01-29 DIAGNOSIS — Z8249 Family history of ischemic heart disease and other diseases of the circulatory system: Secondary | ICD-10-CM

## 2021-01-29 DIAGNOSIS — Z8619 Personal history of other infectious and parasitic diseases: Secondary | ICD-10-CM

## 2021-01-29 DIAGNOSIS — Z87891 Personal history of nicotine dependence: Secondary | ICD-10-CM

## 2021-01-29 DIAGNOSIS — E785 Hyperlipidemia, unspecified: Secondary | ICD-10-CM | POA: Diagnosis present

## 2021-01-29 DIAGNOSIS — I4891 Unspecified atrial fibrillation: Secondary | ICD-10-CM | POA: Diagnosis present

## 2021-01-29 DIAGNOSIS — M1712 Unilateral primary osteoarthritis, left knee: Secondary | ICD-10-CM | POA: Diagnosis present

## 2021-01-29 DIAGNOSIS — Z79899 Other long term (current) drug therapy: Secondary | ICD-10-CM

## 2021-01-29 DIAGNOSIS — Z87440 Personal history of urinary (tract) infections: Secondary | ICD-10-CM

## 2021-01-29 LAB — CBC WITH DIFFERENTIAL/PLATELET
Abs Immature Granulocytes: 0.05 10*3/uL (ref 0.00–0.07)
Basophils Absolute: 0 10*3/uL (ref 0.0–0.1)
Basophils Relative: 0 %
Eosinophils Absolute: 0 10*3/uL (ref 0.0–0.5)
Eosinophils Relative: 1 %
HCT: 30.2 % — ABNORMAL LOW (ref 36.0–46.0)
Hemoglobin: 10.4 g/dL — ABNORMAL LOW (ref 12.0–15.0)
Immature Granulocytes: 1 %
Lymphocytes Relative: 33 %
Lymphs Abs: 2.8 10*3/uL (ref 0.7–4.0)
MCH: 33.2 pg (ref 26.0–34.0)
MCHC: 34.4 g/dL (ref 30.0–36.0)
MCV: 96.5 fL (ref 80.0–100.0)
Monocytes Absolute: 0.5 10*3/uL (ref 0.1–1.0)
Monocytes Relative: 6 %
Neutro Abs: 5.2 10*3/uL (ref 1.7–7.7)
Neutrophils Relative %: 59 %
Platelets: 101 10*3/uL — ABNORMAL LOW (ref 150–400)
RBC: 3.13 MIL/uL — ABNORMAL LOW (ref 3.87–5.11)
RDW: 13.4 % (ref 11.5–15.5)
WBC: 8.7 10*3/uL (ref 4.0–10.5)
nRBC: 0 % (ref 0.0–0.2)

## 2021-01-29 LAB — URINALYSIS, COMPLETE (UACMP) WITH MICROSCOPIC
Bacteria, UA: NONE SEEN
Bilirubin Urine: NEGATIVE
Glucose, UA: NEGATIVE mg/dL
Ketones, ur: NEGATIVE mg/dL
Leukocytes,Ua: NEGATIVE
Nitrite: NEGATIVE
Protein, ur: NEGATIVE mg/dL
Specific Gravity, Urine: 1.041 — ABNORMAL HIGH (ref 1.005–1.030)
pH: 5 (ref 5.0–8.0)

## 2021-01-29 LAB — LIPASE, BLOOD: Lipase: 34 U/L (ref 11–51)

## 2021-01-29 LAB — RESP PANEL BY RT-PCR (FLU A&B, COVID) ARPGX2
Influenza A by PCR: NEGATIVE
Influenza B by PCR: NEGATIVE
SARS Coronavirus 2 by RT PCR: NEGATIVE

## 2021-01-29 LAB — COMPREHENSIVE METABOLIC PANEL
ALT: 12 U/L (ref 0–44)
AST: 18 U/L (ref 15–41)
Albumin: 4.2 g/dL (ref 3.5–5.0)
Alkaline Phosphatase: 68 U/L (ref 38–126)
Anion gap: 12 (ref 5–15)
BUN: 33 mg/dL — ABNORMAL HIGH (ref 8–23)
CO2: 24 mmol/L (ref 22–32)
Calcium: 9.4 mg/dL (ref 8.9–10.3)
Chloride: 103 mmol/L (ref 98–111)
Creatinine, Ser: 0.88 mg/dL (ref 0.44–1.00)
GFR, Estimated: >60 mL/min (ref >60)
Glucose, Bld: 109 mg/dL — ABNORMAL HIGH (ref 70–99)
Potassium: 4 mmol/L (ref 3.5–5.1)
Sodium: 139 mmol/L (ref 135–145)
Total Bilirubin: 0.9 mg/dL (ref 0.3–1.2)
Total Protein: 7.5 g/dL (ref 6.5–8.1)

## 2021-01-29 LAB — MAGNESIUM: Magnesium: 2 mg/dL (ref 1.7–2.4)

## 2021-01-29 LAB — TROPONIN I (HIGH SENSITIVITY): Troponin I (High Sensitivity): 7 ng/L (ref <18)

## 2021-01-29 MED ORDER — COQ10 50 MG PO CAPS: 1.0000 | ORAL_CAPSULE | Freq: Every day | ORAL | Status: DC

## 2021-01-29 MED ORDER — VITAMIN D 25 MCG (1000 UNIT) PO TABS
2000.0000 [IU] | ORAL_TABLET | Freq: Every day | ORAL | Status: DC
  Administered 2021-01-29 – 2021-02-02 (×5): 2000 [IU] via ORAL
  Filled 2021-01-29 (×5): qty 2

## 2021-01-29 MED ORDER — LIDOCAINE 5 % EX PTCH
1.0000 | MEDICATED_PATCH | CUTANEOUS | Status: DC
  Administered 2021-01-29 – 2021-02-02 (×5): 1 via TRANSDERMAL
  Filled 2021-01-29 (×5): qty 1

## 2021-01-29 MED ORDER — ACETAMINOPHEN 500 MG PO TABS
1000.0000 mg | ORAL_TABLET | Freq: Once | ORAL | Status: AC
  Administered 2021-01-29: 1000 mg via ORAL
  Filled 2021-01-29: qty 2

## 2021-01-29 MED ORDER — SIMVASTATIN 20 MG PO TABS
10.0000 mg | ORAL_TABLET | Freq: Every day | ORAL | Status: DC
  Administered 2021-01-29 – 2021-02-01 (×4): 10 mg via ORAL
  Filled 2021-01-29 (×4): qty 1

## 2021-01-29 MED ORDER — IOHEXOL 350 MG/ML SOLN
75.0000 mL | Freq: Once | INTRAVENOUS | Status: AC | PRN
  Administered 2021-01-29: 75 mL via INTRAVENOUS

## 2021-01-29 MED ORDER — SODIUM CHLORIDE 0.9% FLUSH
3.0000 mL | Freq: Two times a day (BID) | INTRAVENOUS | Status: DC
  Administered 2021-01-30 – 2021-02-02 (×6): 3 mL via INTRAVENOUS

## 2021-01-29 MED ORDER — ALPRAZOLAM 0.25 MG PO TABS
0.2500 mg | ORAL_TABLET | Freq: Every evening | ORAL | Status: DC | PRN
  Administered 2021-01-30 – 2021-02-01 (×4): 0.25 mg via ORAL
  Filled 2021-01-29 (×4): qty 1

## 2021-01-29 MED ORDER — METOPROLOL SUCCINATE ER 50 MG PO TB24
50.0000 mg | ORAL_TABLET | Freq: Every day | ORAL | Status: DC
  Administered 2021-01-29 – 2021-02-02 (×5): 50 mg via ORAL
  Filled 2021-01-29 (×5): qty 1

## 2021-01-29 MED ORDER — ASPIRIN EC 81 MG PO TBEC
81.0000 mg | DELAYED_RELEASE_TABLET | Freq: Every day | ORAL | Status: DC
  Administered 2021-01-29 – 2021-02-02 (×5): 81 mg via ORAL
  Filled 2021-01-29 (×5): qty 1

## 2021-01-29 MED ORDER — FAMOTIDINE 20 MG PO TABS
10.0000 mg | ORAL_TABLET | ORAL | Status: DC | PRN
  Administered 2021-01-29 – 2021-02-01 (×4): 10 mg via ORAL
  Filled 2021-01-29 (×4): qty 1

## 2021-01-29 MED ORDER — SODIUM CHLORIDE 0.9 % IV SOLN: INTRAVENOUS | Status: AC

## 2021-01-29 MED ORDER — SODIUM CHLORIDE 0.9 % IV SOLN: INTRAVENOUS | Status: DC

## 2021-01-29 MED ORDER — MORPHINE SULFATE (PF) 2 MG/ML IV SOLN: 0.5000 mg | INTRAVENOUS | Status: DC | PRN

## 2021-01-29 MED ORDER — ACETAMINOPHEN 325 MG PO TABS
650.0000 mg | ORAL_TABLET | Freq: Four times a day (QID) | ORAL | Status: DC | PRN
  Administered 2021-01-29 – 2021-02-02 (×4): 650 mg via ORAL
  Filled 2021-01-29 (×4): qty 2

## 2021-01-29 MED ORDER — OCUVITE-LUTEIN PO CAPS
ORAL_CAPSULE | Freq: Every day | ORAL | Status: DC
  Administered 2021-01-29 – 2021-02-02 (×5): 1 via ORAL
  Filled 2021-01-29 (×5): qty 1

## 2021-01-29 NOTE — ED Provider Notes (Signed)
Patient reports she feels very weak at home.  Does not think she can take care of her self.  She lives alone.  Currently she has no abdominal pain on my exam.  CT shows possible mild colitis.  She has not had any diarrhea since she has been here.  She is somewhat dehydrated based on her BUN/creatinine ratio.  Stool was Hemoccult negative by report.  She is getting IV fluids currently.  She does not have a elevated white blood count or any other marked lab abnormalities.  I have consulted the hospitalist to see if there is any way we could get her in the hospital but I doubt it.  I have also consulted social work and transition of care team as patient does want to go to a nursing home.   Nena Polio, MD 01/29/21 1001

## 2021-01-29 NOTE — ED Notes (Signed)
Patient is transferred via hospital bed to floor with transport.

## 2021-01-29 NOTE — Progress Notes (Signed)
PHARMACIST - PHYSICIAN ORDER COMMUNICATION  CONCERNING: P&T Medication Policy on Herbal Medications  DESCRIPTION:  This patient's order for:  CoQ10 CAPS 1 capsule has been noted.  This product(s) is classified as an "herbal" or natural product. Due to a lack of definitive safety studies or FDA approval, nonstandard manufacturing practices, plus the potential risk of unknown drug-drug interactions while on inpatient medications, the Pharmacy and Therapeutics Committee does not permit the use of "herbal" or natural products of this type within [facility].   ACTION TAKEN: The pharmacy department is unable to verify this order at this time.  Please reevaluate patient's clinical condition at discharge and address if the herbal or natural product(s) should be resumed at that time.  Pernell Dupre, PharmD, BCPS Clinical Pharmacist 01/29/2021 10:19 AM

## 2021-01-29 NOTE — Evaluation (Signed)
Physical Therapy Evaluation Patient Details Name: Alicia Moran MRN: [id number] DOB: September 19, 1928 Today's Date: 01/29/2021   History of Present Illness  Pt is a 87 y.o. female with medical history significant for atrial fibrillation not on anticoagulation, hyperlipidemia, CVA , left knee pain who presented to the emergency department with multiple complaints.  Patient complaint of diarrhea for 2 weeks she stated she was told it was E. coli by her primary doctor.   Clinical Impression  Patient alert, oriented x4. Reported new onset R knee pain that has significantly limited her mobility and independence at home recently. Stated she lives alone (has personal aides that come at night) but that she is home alone during the day. Ambulated with rollator, denied any recent falls.  The patient was able to perform a few supine exercises with tactile cues. Supine to sit with CGA, effortful for patient. Able to return to supine with minA for her legs. Sit <> stand with RW and minA, and able to take a few lateral steps towards [facility] with minA as well. Pt very limited due to pain and fear. Returned to supine with all needs in reach.  Overall the patient demonstrated deficits (see "PT Problem List") that impede the patient's functional abilities, safety, and mobility and would benefit from skilled PT intervention. Recommendation is SNF due to acute decline in functional status and decreased caregiver support. The patient also expressed an interest in transitioning to an ALF for further support after rehab, social worker notified.     Follow Up Recommendations SNF    Equipment Recommendations  None recommended by PT    Recommendations for Other Services       Precautions / Restrictions Precautions Precautions: Fall Restrictions Weight Bearing Restrictions: No      Mobility  Bed Mobility Overal bed mobility: Needs Assistance Bed Mobility: Supine to Sit;Sit to Supine     Supine to sit: Min  guard Sit to supine: Min assist        Transfers Overall transfer level: Needs assistance Equipment used: Rolling walker (2 wheeled) Transfers: Sit to/from Stand Sit to Stand: Min assist         General transfer comment: minA for steadying pt very pain dominant with weight  bearing due to R knee  Ambulation/Gait Ambulation/Gait assistance: Min assist Gait Distance (Feet): 2 Feet Assistive device: Rolling walker (2 wheeled)       General Gait Details: pt took a few steps to the R, minA for steadying and RW assist  Stairs            Wheelchair Mobility    Modified Rankin (Stroke Patients Only)       Balance Overall balance assessment: Needs assistance Sitting-balance support: Feet unsupported;Bilateral upper extremity supported Sitting balance-Leahy Scale: Fair       Standing balance-Leahy Scale: Poor Standing balance comment: reliant on UE support                             Pertinent Vitals/Pain Pain Assessment: 0-10 Pain Score: 10-Worst pain ever Pain Location: R knee pain Pain Descriptors / Indicators: Grimacing;Aching;Tightness Pain Intervention(s): Limited activity within patient's tolerance;Monitored during session;Repositioned    Home Living Family/patient expects to be discharged to:: Private residence Living Arrangements: Alone Available Help at Discharge: Personal care attendant;Other (Comment) (aides previously only at night) Type of Home: House Home Access: Stairs to enter Entrance Stairs-Rails: None Entrance Stairs-Number of Steps: 2 Home Layout: One level Home Equipment:  Grab bars - toilet;Grab bars - tub/shower;Walker - 4 wheels;Tub bench;Wheelchair - manual      Prior Function Level of Independence: Independent         Comments: previously independent but has had increasing difficulty with ADLs/mobility in the last couple of weeks     Hand Dominance   Dominant Hand: Right    Extremity/Trunk Assessment    Upper Extremity Assessment Upper Extremity Assessment: Generalized weakness    Lower Extremity Assessment Lower Extremity Assessment: Generalized weakness (R strength limited due to R pain)    Cervical / Trunk Assessment Cervical / Trunk Assessment: Normal  Communication   Communication: HOH  Cognition Arousal/Alertness: Awake/alert Behavior During Therapy: WFL for tasks assessed/performed Overall Cognitive Status: Within Functional Limits for tasks assessed                                        General Comments      Exercises Other Exercises Other Exercises: set up for lunch. time spent on educating pt on discharge options   Assessment/Plan    PT Assessment Patient needs continued PT services  PT Problem List Decreased strength;Decreased mobility;Decreased activity tolerance;Decreased balance;Decreased knowledge of use of DME;Pain;Decreased range of motion       PT Treatment Interventions DME instruction;Therapeutic exercise;Gait training;Balance training;Stair training;Neuromuscular re-education;Functional mobility training;Therapeutic activities;Patient/family education    PT Goals (Current goals can be found in the Care Plan section)  Acute Rehab PT Goals Patient Stated Goal: pt would like to transition to ALF for safety PT Goal Formulation: With patient Time For Goal Achievement: 02/12/21 Potential to Achieve Goals: Good    Frequency Min 2X/week   Barriers to discharge Decreased caregiver support      Co-evaluation               AM-PAC PT "6 Clicks" Mobility  Outcome Measure Help needed turning from your back to your side while in a flat bed without using bedrails?: None Help needed moving from lying on your back to sitting on the side of a flat bed without using bedrails?: A Little Help needed moving to and from a bed to a chair (including a wheelchair)?: A Little Help needed standing up from a chair using your arms (e.g., wheelchair  or bedside chair)?: A Little Help needed to walk in hospital room?: A Little Help needed climbing 3-5 steps with a railing? : A Lot 6 Click Score: 18    End of Session Equipment Utilized During Treatment: Gait belt Activity Tolerance: Patient limited by pain Patient left: in bed;with call bell/phone within reach Nurse Communication: Mobility status PT Visit Diagnosis: Other abnormalities of gait and mobility (R26.89);Muscle weakness (generalized) (M62.81);Difficulty in walking, not elsewhere classified (R26.2);Pain Pain - Right/Left: Right Pain - part of body: Knee    Time: 1102-1129 PT Time Calculation (min) (ACUTE ONLY): 27 min   Charges:   PT Evaluation $PT Eval Low Complexity: 1 Low PT Treatments $Therapeutic Exercise: 8-22 mins $Therapeutic Activity: 8-22 mins       Olga Coaster PT, DPT 11:50 AM,01/29/21

## 2021-01-29 NOTE — ED Notes (Signed)
Multiple failed attempts at I & O cath via x2 RNs. Purewick in placed.

## 2021-01-29 NOTE — ED Notes (Signed)
XR at bedside

## 2021-01-29 NOTE — ED Triage Notes (Signed)
Pt presents to the ER from home with complaints of diarrhea ongoing for x2 weeks. Also reports right knee pain. Also reports she needs around  the clock care because she cant walk.

## 2021-01-29 NOTE — H&P (Addendum)
History and Physical  Alicia Moran:[id number] DOB: [birth year] DOA: 01/29/2021  Referring physician: Dr. Marge Duncans PCP: [name], MD  Outpatient Specialists:  Patient coming from: Home & is NOT able to ambulate   Chief Complaint: Generalized weakness inability to ambulate  HPI: Alicia Moran is a 87 y.o. female with medical history significant for atrial fibrillation not on anticoagulation, hyperlipidemia, CVA , left knee pain who presented to the emergency department with multiple complaints.  Patient complaint of diarrhea for 2 weeks she stated she was told it was E. coli by her primary doctor Dr. Arlana Pouch.  She stated she had her last bowel movement x2 this morning She said that she has completed his cefuroxime but continues to have the copious diarrhea that is causing her to feel very weak denies any nausea or vomiting.  Patient stated that she is afraid that she may have had C. difficile because she has a history of C. difficile in 2012 while she was in the nursing home   for rehab from a a bone fracture.  Patient also requested for nursing home placement because she states that she lives alone and has not been able to walk because of knee pain.  She stated she cannot go back home and she does not have anybody to help her.  She says she has an aide who comes in at night to help her but throughout the day she is just sitting in the chair unable to get up because she cannot walk and not able fix her food.  She stated she needs 24-hour care.  And that she cannot go back home she has had a history of fall with fracture in 2012 when she has severe degenerative arthritis of the right knee and she is legally blind on the right eye.  She stated she can no longer take care of herself at home.      ED Course: In the emergency room CT scan was done that showed mild right colitis, she is slightly dehydrated with increased BUN and creatinine ratio she was started on IV fluid , her stool  was negative for Hemoccult, she white count was normal, EKG was sinus rhythm.  Her UA showed only mucus but she had just completed antibiotics for urinary tract infection which she stated was E. coli UTI.  Right knee x-ray showed osteopenia no acute abnormality or fracture.  Status post knee replacement with intramedullary rod which is intact and in place. ED provider has consulted social work and transition care management team    Review of Systems: . Pt complains of right knee pain, diarrhea Appreciated .  Review of systems are otherwise negative   Past Medical History:  Diagnosis Date   Atrial fibrillation (HCC)    Hyperlipidemia    Migraine headache    Stroke (HCC)    No past surgical history on file.  Social History:  reports that she quit smoking about 34 years ago. She has never used smokeless tobacco. She reports that she does not drink alcohol and does not use drugs.   Allergies  Allergen Reactions   Amoxicillin Diarrhea   Erythromycin    Nsaids Other (See Comments)    Other Reaction: GI ulcers    Family History  Problem Relation Age of Onset   Hypertension Mother    Diabetes Mother    Hypertension Father       Prior to Admission medications   Medication Sig Start Date End Date Taking? Authorizing  Provider  acetaminophen (TYLENOL) 500 MG tablet Take 500-1,000 mg by mouth every 6 (six) hours as needed (pain).   Yes [provider]  ALPRAZolam (XANAX) 0.25 MG tablet Take 0.125 mg by mouth 2 (two) times daily as needed for anxiety or sleep.   Yes [provider]  aspirin 81 MG tablet Take 81 mg by mouth daily.   Yes [provider]  Cholecalciferol 50 MCG (2000 UT) CAPS Take 2,000 Units by mouth daily.   Yes [provider]  Coenzyme Q10 (COQ10) 50 MG CAPS Take 50 mg by mouth daily.   Yes [provider]  famotidine (PEPCID) 10 MG tablet Take 10 mg by mouth daily as needed for heartburn or indigestion.   Yes [provider]  glucosamine-chondroitin (COSAMIN DS) 500-400 MG tablet Take 1 tablet by mouth as directed.   Yes [provider]  metoprolol succinate (TOPROL-XL) 50 MG 24 hr tablet TAKE 1 TABLET EVERY DAY 07/07/20  Yes [name], MD  Multiple Vitamins-Minerals (PRESERVISION AREDS 2+MULTI VIT PO) Take 2 tablets by mouth daily.   Yes [provider]  simvastatin (ZOCOR) 10 MG tablet TAKE 1 TABLET AT BEDTIME 11/16/20  Yes [name], MD    Physical Exam: BP 107/75   Pulse 69   Temp 98.7 F (37.1 C) (Rectal)   Resp 15   Ht 5\' 2"  (1.575 m)   Wt 60.3 kg   SpO2 95%   BMI 24.31 kg/m   Exam:  General: 87 y.o. year-old female well developed well nourished in no acute distress.  Alert and oriented x3. HEENT: Slightly hard of hearing Cardiovascular: Regular rate and rhythm with no rubs or gallops.  No thyromegaly or JVD noted.   Respiratory: Clear to auscultation with no wheezes or rales. Good inspiratory effort. Abdomen: Soft nontender nondistended with normal bowel sounds x4 quadrants. Musculoskeletal: No lower extremity edema. 2/4 pulses in all 4 extremities.  Tender right knee with mild swelling Skin: No ulcerative lesions noted or rashes, Psychiatry: Mood is appropriate for condition and setting           Labs on Admission:  Basic Metabolic Panel: Recent Labs  Lab 01/29/21 0537  NA 139  K 4.0  CL 103  CO2 24  GLUCOSE 109*  BUN 33*  CREATININE 0.88  CALCIUM 9.4   Liver Function Tests: Recent Labs  Lab 01/29/21 0537  AST 18  ALT 12  ALKPHOS 68  BILITOT 0.9  PROT 7.5  ALBUMIN 4.2   Recent Labs  Lab 01/29/21 0537  LIPASE 34   No results for input(s): AMMONIA in the last 168 hours. CBC: Recent Labs  Lab 01/29/21 0537  WBC 8.7  NEUTROABS 5.2  HGB 10.4*  HCT 30.2*  MCV 96.5  PLT 101*   Cardiac Enzymes: No results for input(s): CKTOTAL, CKMB, CKMBINDEX, TROPONINI in the last 168 hours.  BNP (last 3 results) No results for  input(s): BNP in the last 8760 hours.  ProBNP (last 3 results) No results for input(s): PROBNP in the last 8760 hours.  CBG: No results for input(s): GLUCAP in the last 168 hours.  Radiological Exams on Admission: CT ABDOMEN PELVIS W CONTRAST  Result Date: 01/29/2021 CLINICAL DATA:  87 year old female with abdominal distension and diarrhea. EXAM: CT ABDOMEN AND PELVIS WITH CONTRAST TECHNIQUE: Multidetector CT imaging of the abdomen and pelvis was performed using the standard protocol following bolus administration of intravenous contrast. CONTRAST:  75mL OMNIPAQUE IOHEXOL 350 MG/ML SOLN COMPARISON:  CT Abdomen and Pelvis 12/07/2015. FINDINGS: Lower chest: Cardiomegaly. No pericardial effusion. Calcified coronary artery and aortic atherosclerosis. Mild lung base scarring is stable since 2017. No pleural effusion. Hepatobiliary: Stable liver with multiple scattered small circumscribed cysts and occasional calcified hepatic granulomas. Probable small gallstones on series 2, image 32 but otherwise negative gallbladder. Pancreas: Negative. Spleen: Negative. Adrenals/Urinary Tract: Adrenal glands are normal. Benign right upper pole renal cyst is stable. Nonobstructed kidneys. Symmetric renal enhancement and contrast excretion. Diminutive and unremarkable bladder. Numerous pelvic phleboliths. No convincing urinary calculus. Stomach/Bowel: Redundant large bowel. Decompressed transverse and rectosigmoid colon. But wall thickening and indistinct appearance of the large bowel from the distal transverse through the descending segments (coronal image 27 and series 2, image 39) with up to mild mesenteric stranding. Proximal transverse colon, hepatic flexure, and right colon have a more normal appearance, although a portion of the redundant ascending colon also appears inflamed on coronal image 43. Questionable mild cecal inflammation. Appendix not identified, could be diminutive or absent. Terminal ileum is  decompressed and not definitely inflamed. No dilated small bowel. Decompressed stomach. Duodenum is within normal limits. No free air or free fluid. Vascular/Lymphatic: Extensive Aortoiliac calcified atherosclerosis. Normal caliber abdominal aorta. Major aortic structures remain patent. Portal venous system is patent. No lymphadenopathy. Reproductive: Negative. Other: No pelvic free fluid. Musculoskeletal: Osteopenia. L2 compression fracture with inferior endplate deformity is stable since 2017. Previous proximal right femur ORIF. No acute osseous abnormality identified. IMPRESSION: 1. Evidence of Acute Colitis with intermittent inflamed large bowel from the right colon through the sigmoid, maximal from the distal transverse through the descending segments. Favor Infectious Colitis. No associated bowel obstruction, free fluid, or other complicating features. 2. No other acute or inflammatory process identified in the abdomen or pelvis. Cholelithiasis without evidence of acute cholecystitis. Cardiomegaly. Calcified coronary artery and Aortic Atherosclerosis (ICD10-I70.0). Chronic lumbar compression fracture. Electronically Signed   By: Odessa Fleming M.D.   On: 01/29/2021 07:45   DG Knee Complete 4 Views Right  Result Date: 01/29/2021 CLINICAL DATA:  87 year old female with diarrhea for 2 weeks. Right knee pain. EXAM: RIGHT KNEE - COMPLETE 4+ VIEW COMPARISON:  Right femur series 06/11/2014. FINDINGS: Distal aspect of the chronic right femoral intramedullary rod with distal interlocking cortical screw. Visible hardware appears intact. No joint effusion on the cross-table lateral view. Joint spaces and alignment are normal for age. Osteopenia. No acute osseous abnormality identified. Calcified peripheral vascular disease. IMPRESSION: 1. No acute osseous abnormality identified about the right knee. 2. Stable visible chronic right femoral intramedullary rod. 3. Calcified peripheral vascular disease. Electronically Signed    By: Odessa Fleming M.D.   On: 01/29/2021 05:58    EKG: Independently reviewed.non  Assessment/Plan Present on Admission: **None**  Active Problems:   Generalized weakness #1 generalized weakness 2 to 2 weeks of diarrhea. CT scan is showing mild colitis..  Patient has not had any bowel movement since had 2 loose stool this morning at home. We will continue supportive care  2.  Mild dehydration due to diarrhea for 2 weeks.  We will continue IV hydration Will monitor electrolytes  3..  Right knee pain status post replacement.  Patient is not able to ambulate due to right knee pain.  Denies any recent fall.  I have requested for physical therapy evaluation  4.  History of atrial fibrillation not on medication EKG shows sinus rhythm  5.  Debility 19 dysfunction due to right knee pain.  Patient is requesting nursing home placement as  she can no longer stay at home and unable to take care of herself.  ED physician has consulted transitional care as well as Child psychotherapist. I have requested for physical therapy evaluation.  Severity of Illness: The appropriate patient status for this patient is OBSERVATION. Observation status is judged to be reasonable and necessary in order to provide the required intensity of service to ensure the patient's safety. The patient's presenting symptoms, physical exam findings, and initial radiographic and laboratory data in the context of their medical condition is felt to place them at decreased risk for further clinical deterioration. Furthermore, it is anticipated that the patient will be medically stable for discharge from the hospital within 2 midnights of admission. The following factors support the patient status of observation.   " The patient's presenting symptoms include generalized weakness due to 2 weeks of diarrhea, the right upper quadrant inability to ambulate. " The physical exam findings include tenderness right knee. " The initial radiographic and  laboratory data are mild colitis.  And mild dehydration  DVT prophylaxis: scd  Code Status:full  Family Communication: none  Disposition Plan: to be determined   Consults called:   Admission status: obs    Myrtie Neither MD [facility] Pager [phone]  If 7PM-7AM, please contact night-coverage [email or web address] Password [email or web address]  01/29/2021, 10:50 AM

## 2021-01-29 NOTE — ED Provider Notes (Signed)
[facility] Emergency Department Provider Note  ____________________________________________   Event Date/Time   First MD Initiated Contact with Patient 01/29/21 [phone]     (approximate)  I have reviewed the triage vital signs and the nursing notes.   HISTORY  Chief Complaint Diarrhea and Knee Pain    HPI Alicia Moran is a 87 y.o. female history of atrial fibrillation not on anticoagulation, hyperlipidemia, CVA who presents to the emergency department with EMS with multiple complaints.  Reports she has had diarrhea for 2 weeks and was told that it was E. coli by her primary care doctor Dr. Hall Busing and was started on cefuroxime.  States she has finished this medication but continues to have copious diarrhea.  She states she is worried that this could now be C. difficile as she has had a history of the same.  She states her abdomen is bloated but not painful.  No fevers, chills, nausea, vomiting, dysuria.  Also feels that she has had significant generalized weakness.  States she feels she is unable to care for herself at home due to this weakness.  She lives at home alone.  She denies any chest pain or shortness of breath.  She does have home health care provider but they are not with her 24/7.  Patient also complaining of right knee pain.  States she woke up with this pain Wednesday, August 10.  No known injury.  No recent falls.  Has had previous right hip surgery in 2014.  She states she went to [facility] and had an x-ray and was told it was "bone-on-bone" but was told she was not a surgical candidate given her age.  She states he is having a hard time ambulating because of this pain.        Past Medical History:  Diagnosis Date   Atrial fibrillation ([place])    Hyperlipidemia    Migraine headache    Stroke [facility])     Patient Active Problem List   Diagnosis Date Noted   Disorder of bursae of shoulder region 03/15/2020   Cervical spondylosis 06/30/2018    Muscle strain of left scapular region 06/30/2018   Degenerative tear of medial meniscus of left knee 03/12/2018   Rotator cuff tendinitis, right 03/12/2018   Stress fracture of right calcaneus 01/14/2018   Left knee pain 10/23/2017   Stress fracture of left femur with delayed healing 10/23/2017   Osteoarthritis of knee 09/26/2017   Dysuria 01/19/2017   Acute bronchitis 09/19/2016   HTN (hypertension) 09/13/2016   Monoclonal gammopathy of unknown significance (MGUS) 09/11/2016   Fatigue 05/05/2016   Thrombocytopenia ([place]) 05/03/2016   Osteoporosis 03/20/2016   Valvular heart disease 12/23/2015   Constipation 09/04/2015   Perforation of right tympanic membrane 03/28/2015   Mild cerebral atrophy ([place]) 03/02/2015   Neuropathy 03/02/2015   History of falling, presenting hazards to health 07/17/2014   Iron deficiency anemia due to chronic blood loss 07/17/2014   Positive FIT (fecal immunochemical test) 07/17/2014   Atherosclerotic peripheral vascular disease ([place]) 07/17/2014   H/O: CVA (cerebrovascular accident) 06/14/2014   Subdural hematoma ([place]) 06/12/2014   Adjustment disorder with mixed anxiety and depressed mood 10/31/2013   History of Clostridium difficile colitis 03/05/2013   Intertrochanteric fracture of right hip ([place]) 03/05/2013   Lens replaced by other means 04/25/2012   Senile cataract 04/25/2012   Severe stage glaucoma 12/01/2011   Paroxysmal atrial fibrillation (HCC)    Stroke (HCC)    Migraine headache  High cholesterol     No past surgical history on file.  Prior to Admission medications   Medication Sig Start Date End Date Taking? Authorizing Provider  Acetaminophen (TYLENOL EXTRA STRENGTH PO) Take by mouth as needed.    [provider]  ALPRAZolam Duanne Moron) 0.25 MG tablet Take 0.25 mg by mouth at bedtime as needed for anxiety.    [provider]  aspirin 81 MG tablet Take 81 mg by mouth daily.    [provider]  Cholecalciferol  50 MCG (2000 UT) CAPS Take 1 capsule by mouth daily.    [provider]  Coenzyme Q10 (COQ10) 50 MG CAPS Take 1 capsule by mouth daily.    [provider]  famotidine (PEPCID) 10 MG tablet Take 10 mg by mouth as needed for heartburn or indigestion.    [provider]  metoprolol succinate (TOPROL-XL) 50 MG 24 hr tablet TAKE 1 TABLET EVERY DAY 07/07/20   Wellington Hampshire, MD  Multiple Vitamins-Minerals (PRESERVISION AREDS 2+MULTI VIT PO) Take 2 tablets by mouth daily.    [provider]  NON FORMULARY Cosamine 1 capsule qd.    [provider]  simvastatin (ZOCOR) 10 MG tablet TAKE 1 TABLET AT BEDTIME 11/16/20   Wellington Hampshire, MD    Allergies Amoxicillin, Erythromycin, and Nsaids  Family History  Problem Relation Age of Onset   Hypertension Mother    Diabetes Mother    Hypertension Father     Social History Social History   Tobacco Use   Smoking status: Former    Types: Cigarettes    Quit date: 03/29/1986    Years since quitting: 34.8   Smokeless tobacco: Never  Substance Use Topics   Alcohol use: No   Drug use: No    Review of Systems Constitutional: No fever. Eyes: No visual changes. ENT: No sore throat. Cardiovascular: Denies chest pain. Respiratory: Denies shortness of breath. Gastrointestinal: No nausea, vomiting.  + diarrhea. Genitourinary: Negative for dysuria. Musculoskeletal: Negative for back pain. Skin: Negative for rash. Neurological: Negative for focal weakness or numbness.  ____________________________________________   PHYSICAL EXAM:  VITAL SIGNS: ED Triage Vitals  Enc Vitals Group     BP 01/29/21 0519 (!) 115/93     Pulse Rate 01/29/21 0519 85     Resp 01/29/21 0519 18     Temp 01/29/21 0519 98 F (36.7 C)     Temp Source 01/29/21 0519 Oral     SpO2 01/29/21 0519 98 %     Weight 01/29/21 0523 132 lb 15 oz (60.3 kg)     Height 01/29/21 0523 '5\' 2"'$  (1.575 m)     Head Circumference --      Peak  Flow --      Pain Score 01/29/21 0528 0     Pain Loc --      Pain Edu? --      Excl. in [place]? --    CONSTITUTIONAL: Alert and oriented and responds appropriately to questions.  Elderly, hard of hearing HEAD: Normocephalic EYES: Conjunctivae clear, pupils appear equal, EOM appear intact ENT: normal nose; moist mucous membranes NECK: Supple, normal ROM CARD: RRR; S1 and S2 appreciated; no murmurs, no clicks, no rubs, no gallops RESP: Normal chest excursion without splinting or tachypnea; breath sounds clear and equal bilaterally; no wheezes, no rhonchi, no rales, no hypoxia or respiratory distress, speaking full sentences ABD/GI: Normal bowel sounds; slightly distended, soft, non-tender, no rebound, no guarding, no peritoneal signs, no hepatosplenomegaly RECTAL:  Normal rectal tone, no gross blood or melena, guaiac NEGATIVE, no hemorrhoids appreciated, nontender rectal exam, no fecal impaction. Chaperone present. BACK: The back appears normal EXT: Patient is tender to palpation over the anterior knee diffusely with some soft tissue swelling and mild joint effusion.  There is no redness, warmth.  She is able to fully extend his knee but does have pain with flexion.  Difficult to test for ligamentous laxity due to patient's poor tolerance to the exam.  Extremity is warm and well-perfused.  Normal sensation in the right leg.  No bony deformity.  Compartments are soft.  No calf tenderness or calf swelling. SKIN: Normal color for age and race; warm; no rash on exposed skin NEURO: Moves all extremities equally, normal speech, no facial asymmetry PSYCH: The patient's mood and manner are appropriate.  ____________________________________________   LABS (all labs ordered are listed, but only abnormal results are displayed)  Labs Reviewed  CBC WITH DIFFERENTIAL/PLATELET - Abnormal; Notable for the following components:      Result Value   RBC 3.13 (*)    Hemoglobin 10.4 (*)    HCT 30.2 (*)     Platelets 101 (*)    All other components within normal limits  COMPREHENSIVE METABOLIC PANEL - Abnormal; Notable for the following components:   Glucose, Bld 109 (*)    BUN 33 (*)    All other components within normal limits  GASTROINTESTINAL PANEL BY PCR, STOOL (REPLACES STOOL CULTURE)  C DIFFICILE QUICK SCREEN W PCR REFLEX    RESP PANEL BY RT-PCR (FLU A&B, COVID) ARPGX2  LIPASE, BLOOD  URINALYSIS, COMPLETE (UACMP) WITH MICROSCOPIC  TROPONIN I (HIGH SENSITIVITY)   ____________________________________________  EKG   EKG Interpretation  Date/Time:  Sunday January 29 2021 05:36:36 EDT Ventricular Rate:  75 PR Interval:  159 QRS Duration: 99 QT Interval:  382 QTC Calculation: 427 R Axis:   -2 Text Interpretation: Sinus rhythm No significant change since last tracing Confirmed by Pryor Curia [phone]) on 01/29/2021 5:38:08 AM        ____________________________________________  RADIOLOGY Jessie Foot Shauntelle Jamerson, personally viewed and evaluated these images (plain radiographs) as part of my medical decision making, as well as reviewing the written report by the radiologist.  ED MD interpretation: Right knee x-ray shows no acute osseous abnormality.  Official radiology report(s): DG Knee Complete 4 Views Right  Result Date: 01/29/2021 CLINICAL DATA:  87 year old female with diarrhea for 2 weeks. Right knee pain. EXAM: RIGHT KNEE - COMPLETE 4+ VIEW COMPARISON:  Right femur series 06/11/2014. FINDINGS: Distal aspect of the chronic right femoral intramedullary rod with distal interlocking cortical screw. Visible hardware appears intact. No joint effusion on the cross-table lateral view. Joint spaces and alignment are normal for age. Osteopenia. No acute osseous abnormality identified. Calcified peripheral vascular disease. IMPRESSION: 1. No acute osseous abnormality identified about the right knee. 2. Stable visible chronic right femoral intramedullary rod. 3. Calcified peripheral vascular  disease. Electronically Signed   By: Genevie Ann M.D.   On: 01/29/2021 05:58    ____________________________________________   PROCEDURES  Procedure(s) performed (including Critical Care):  Procedures   ____________________________________________   INITIAL IMPRESSION / ASSESSMENT AND PLAN / ED COURSE  As part of my medical decision making, I reviewed the following data within the [place] notes reviewed and incorporated, Labs reviewed , EKG interpreted , Old EKG reviewed, Old chart reviewed, Patient signed out to oncoming EDP, Radiograph reviewed , and Notes from prior ED visits  Patient here with complaints of diarrhea, abdominal bloating and distention, generalized weakness and right knee pain.  I suspect that her generalized weakness may be secondary to having 2 weeks of diarrhea.  We will check for anemia, electrolyte derangement, dehydration, UTI.  Also obtain a CT of the abdomen pelvis to evaluate for colitis, diverticulitis, bowel obstruction.  Will obtain a stool sample.  We will gently hydrate patient.  I suspect that her right knee pain is also contributing to why she feels she cannot care for herself at home.  We will repeat x-rays here to evaluate for possible pathologic fracture.  No sign of compartment syndrome, arterial obstruction, DVT, gout, septic arthritis, cellulitis on exam.  Will provide Tylenol for pain control.  ED PROGRESS  Patient's labs unremarkable.  Normal electrolytes, glucose, LFTs, lipase, renal function.  She is anemic but this is stable.  Troponin negative.  X-ray of the knee shows no acute osseous abnormality.  Urinalysis, CT abdomen pelvis, stool studies pending.  Signed out the oncoming ED physician to follow-up on these test results for further disposition.  Patient may need medical admission versus social work evaluation for potential placement.  I reviewed all nursing notes and pertinent previous records as  available.  I have reviewed and interpreted any EKGs, lab and urine results, imaging (as available).  ____________________________________________   FINAL CLINICAL IMPRESSION(S) / ED DIAGNOSES  Final diagnoses:  Diarrhea of infectious origin  Acute pain of right knee  Generalized weakness     ED Discharge Orders     None       *Please note:  Alicia Moran was evaluated in Emergency Department on 01/29/2021 for the symptoms described in the history of present illness. She was evaluated in the context of the global COVID-19 pandemic, which necessitated consideration that the patient might be at risk for infection with the SARS-CoV-2 virus that causes COVID-19. Institutional protocols and algorithms that pertain to the evaluation of patients at risk for COVID-19 are in a state of rapid change based on information released by regulatory bodies including the CDC and federal and state organizations. These policies and algorithms were followed during the patient's care in the ED.  Some ED evaluations and interventions may be delayed as a result of limited staffing during and the pandemic.*   Note:  This document was prepared using Dragon voice recognition software and may include unintentional dictation errors.    Ercil Cassis, Delice Bison, DO 01/29/21 [phone]

## 2021-01-29 NOTE — ED Notes (Signed)
ED Provider at bedside. 

## 2021-01-29 NOTE — NC FL2 (Signed)
[place] MEDICAID FL2 LEVEL OF CARE SCREENING TOOL     IDENTIFICATION  Patient Name: Alicia Moran Birthdate: 1929-01-03 Sex: female Admission Date (Current Location): 01/29/2021  [facility] and [place] Number:  Chiropodist and Address:  [facility], [address], [place], [place] [address]      Provider Number: [id number]  Attending Physician Name and Address:  Myrtie Neither, MD  Relative Name and Phone Number:  Erskine Squibb (niece) [phone]    Current Level of Care: Hospital Recommended Level of Care: Skilled Nursing Facility Prior Approval Number:    Date Approved/Denied:   PASRR Number: [id number]  Discharge Plan: SNF    Current Diagnoses: Patient Active Problem List   Diagnosis Date Noted   Generalized weakness 01/29/2021   Disorder of bursae of shoulder region 03/15/2020   Cervical spondylosis 06/30/2018   Muscle strain of left scapular region 06/30/2018   Degenerative tear of medial meniscus of left knee 03/12/2018   Rotator cuff tendinitis, right 03/12/2018   Stress fracture of right calcaneus 01/14/2018   Left knee pain 10/23/2017   Stress fracture of left femur with delayed healing 10/23/2017   Osteoarthritis of knee 09/26/2017   Dysuria 01/19/2017   Acute bronchitis 09/19/2016   HTN (hypertension) 09/13/2016   Monoclonal gammopathy of unknown significance (MGUS) 09/11/2016   Fatigue 05/05/2016   Thrombocytopenia (HCC) 05/03/2016   Osteoporosis 03/20/2016   Valvular heart disease 12/23/2015   Constipation 09/04/2015   Perforation of right tympanic membrane 03/28/2015   Mild cerebral atrophy (HCC) 03/02/2015   Neuropathy 03/02/2015   History of falling, presenting hazards to health 07/17/2014   Iron deficiency anemia due to chronic blood loss 07/17/2014   Positive FIT (fecal immunochemical test) 07/17/2014   Atherosclerotic peripheral vascular disease (HCC) 07/17/2014   H/O: CVA (cerebrovascular accident)  06/14/2014   Subdural hematoma (HCC) 06/12/2014   Adjustment disorder with mixed anxiety and depressed mood 10/31/2013   History of Clostridium difficile colitis 03/05/2013   Intertrochanteric fracture of right hip (HCC) 03/05/2013   Lens replaced by other means 04/25/2012   Senile cataract 04/25/2012   Severe stage glaucoma 12/01/2011   Paroxysmal atrial fibrillation (HCC)    Stroke (HCC)    Migraine headache    High cholesterol     Orientation RESPIRATION BLADDER Height & Weight     Self, Time, Situation, Place  Normal Incontinent Weight: 132 lb 15 oz (60.3 kg) Height:  5\' 2"  (157.5 cm)  BEHAVIORAL SYMPTOMS/MOOD NEUROLOGICAL BOWEL NUTRITION STATUS      Continent Diet (no discharge summary)  AMBULATORY STATUS COMMUNICATION OF NEEDS Skin   Limited Assist Verbally Normal                       Personal Care Assistance Level of Assistance  Bathing, Feeding, Dressing, Total care Bathing Assistance: Limited assistance Feeding assistance: Independent Dressing Assistance: Limited assistance Total Care Assistance: Limited assistance   Functional Limitations Info  Sight, Speech, Hearing Sight Info: Adequate Hearing Info: Adequate Speech Info: Adequate    SPECIAL CARE FACTORS FREQUENCY  PT (By licensed PT), OT (By licensed OT)     PT Frequency: min 4x weekly OT Frequency: min 4x weekly            Contractures Contractures Info: Not present    Additional Factors Info  Code Status, Allergies Code Status Info: full Allergies Info: amoxicillin, erythromycin, nsaids           Current Medications (01/29/2021):  This  is the current hospital active medication list Current Facility-Administered Medications  Medication Dose Route Frequency Provider Last Rate Last Admin   0.9 %  sodium chloride infusion   Intravenous Continuous Ward, Kristen N, DO 100 mL/hr at 01/29/21 0545 New Bag at 01/29/21 0545   0.9 %  sodium chloride infusion   Intravenous Continuous Myrtie Neither, MD       acetaminophen (TYLENOL) tablet 650 mg  650 mg Oral Q6H PRN Arnaldo Natal, MD       ALPRAZolam Prudy Feeler) tablet 0.25 mg  0.25 mg Oral QHS PRN Arnaldo Natal, MD       aspirin EC tablet 81 mg  81 mg Oral Daily Arnaldo Natal, MD   81 mg at 01/29/21 1212   cholecalciferol (VITAMIN D3) tablet 2,000 Units  2,000 Units Oral Daily Arnaldo Natal, MD   2,000 Units at 01/29/21 1212   famotidine (PEPCID) tablet 10 mg  10 mg Oral PRN Arnaldo Natal, MD   10 mg at 01/29/21 1218   lidocaine (LIDODERM) 5 % 1 patch  1 patch Transdermal Q24H Myrtie Neither, MD   1 patch at 01/29/21 1213   metoprolol succinate (TOPROL-XL) 24 hr tablet 50 mg  50 mg Oral Daily Arnaldo Natal, MD       multivitamin-lutein (OCUVITE-LUTEIN) capsule   Oral Daily Arnaldo Natal, MD   1 capsule at 01/29/21 1213   simvastatin (ZOCOR) tablet 10 mg  10 mg Oral QHS Arnaldo Natal, MD       sodium chloride flush (NS) 0.9 % injection 3 mL  3 mL Intravenous Q12H Myrtie Neither, MD       Current Outpatient Medications  Medication Sig Dispense Refill   acetaminophen (TYLENOL) 500 MG tablet Take 500-1,000 mg by mouth every 6 (six) hours as needed (pain).     ALPRAZolam (XANAX) 0.25 MG tablet Take 0.125 mg by mouth 2 (two) times daily as needed for anxiety or sleep.     aspirin 81 MG tablet Take 81 mg by mouth daily.     Cholecalciferol 50 MCG (2000 UT) CAPS Take 2,000 Units by mouth daily.     Coenzyme Q10 (COQ10) 50 MG CAPS Take 50 mg by mouth daily.     famotidine (PEPCID) 10 MG tablet Take 10 mg by mouth daily as needed for heartburn or indigestion.     glucosamine-chondroitin (COSAMIN DS) 500-400 MG tablet Take 1 tablet by mouth as directed.     metoprolol succinate (TOPROL-XL) 50 MG 24 hr tablet TAKE 1 TABLET EVERY DAY 90 tablet 2   Multiple Vitamins-Minerals (PRESERVISION AREDS 2+MULTI VIT PO) Take 2 tablets by mouth daily.     simvastatin (ZOCOR) 10 MG tablet TAKE 1 TABLET AT BEDTIME 90 tablet 2      Discharge Medications: Please see discharge summary for a list of discharge medications.  Relevant Imaging Results:  Relevant Lab Results:   Additional Information SSN: [id number]  Gildardo Griffes, LCSW

## 2021-01-30 ENCOUNTER — Other Ambulatory Visit (HOSPITAL_COMMUNITY): Payer: Self-pay

## 2021-01-30 DIAGNOSIS — M25561 Pain in right knee: Secondary | ICD-10-CM | POA: Diagnosis not present

## 2021-01-30 DIAGNOSIS — Z886 Allergy status to analgesic agent status: Secondary | ICD-10-CM | POA: Diagnosis not present

## 2021-01-30 DIAGNOSIS — R531 Weakness: Secondary | ICD-10-CM | POA: Diagnosis not present

## 2021-01-30 DIAGNOSIS — E785 Hyperlipidemia, unspecified: Secondary | ICD-10-CM | POA: Diagnosis present

## 2021-01-30 DIAGNOSIS — Z880 Allergy status to penicillin: Secondary | ICD-10-CM | POA: Diagnosis not present

## 2021-01-30 DIAGNOSIS — Z8619 Personal history of other infectious and parasitic diseases: Secondary | ICD-10-CM | POA: Diagnosis not present

## 2021-01-30 DIAGNOSIS — E860 Dehydration: Secondary | ICD-10-CM | POA: Diagnosis present

## 2021-01-30 DIAGNOSIS — Z8249 Family history of ischemic heart disease and other diseases of the circulatory system: Secondary | ICD-10-CM | POA: Diagnosis not present

## 2021-01-30 DIAGNOSIS — Z881 Allergy status to other antibiotic agents status: Secondary | ICD-10-CM | POA: Diagnosis not present

## 2021-01-30 DIAGNOSIS — Z8673 Personal history of transient ischemic attack (TIA), and cerebral infarction without residual deficits: Secondary | ICD-10-CM | POA: Diagnosis not present

## 2021-01-30 DIAGNOSIS — A0472 Enterocolitis due to Clostridium difficile, not specified as recurrent: Secondary | ICD-10-CM | POA: Diagnosis present

## 2021-01-30 DIAGNOSIS — Z833 Family history of diabetes mellitus: Secondary | ICD-10-CM | POA: Diagnosis not present

## 2021-01-30 DIAGNOSIS — Z87891 Personal history of nicotine dependence: Secondary | ICD-10-CM | POA: Diagnosis not present

## 2021-01-30 DIAGNOSIS — Y792 Prosthetic and other implants, materials and accessory orthopedic devices associated with adverse incidents: Secondary | ICD-10-CM | POA: Diagnosis present

## 2021-01-30 DIAGNOSIS — Z87440 Personal history of urinary (tract) infections: Secondary | ICD-10-CM | POA: Diagnosis not present

## 2021-01-30 DIAGNOSIS — E440 Moderate protein-calorie malnutrition: Secondary | ICD-10-CM

## 2021-01-30 DIAGNOSIS — I10 Essential (primary) hypertension: Secondary | ICD-10-CM | POA: Diagnosis present

## 2021-01-30 DIAGNOSIS — Z7982 Long term (current) use of aspirin: Secondary | ICD-10-CM | POA: Diagnosis not present

## 2021-01-30 DIAGNOSIS — M1712 Unilateral primary osteoarthritis, left knee: Secondary | ICD-10-CM | POA: Diagnosis present

## 2021-01-30 DIAGNOSIS — F419 Anxiety disorder, unspecified: Secondary | ICD-10-CM | POA: Diagnosis present

## 2021-01-30 DIAGNOSIS — Z20822 Contact with and (suspected) exposure to covid-19: Secondary | ICD-10-CM | POA: Diagnosis present

## 2021-01-30 DIAGNOSIS — Z79899 Other long term (current) drug therapy: Secondary | ICD-10-CM | POA: Diagnosis not present

## 2021-01-30 DIAGNOSIS — I4891 Unspecified atrial fibrillation: Secondary | ICD-10-CM | POA: Diagnosis present

## 2021-01-30 DIAGNOSIS — T8484XA Pain due to internal orthopedic prosthetic devices, implants and grafts, initial encounter: Secondary | ICD-10-CM | POA: Diagnosis present

## 2021-01-30 DIAGNOSIS — A09 Infectious gastroenteritis and colitis, unspecified: Secondary | ICD-10-CM | POA: Diagnosis present

## 2021-01-30 DIAGNOSIS — H548 Legal blindness, as defined in USA: Secondary | ICD-10-CM | POA: Diagnosis present

## 2021-01-30 LAB — GASTROINTESTINAL PANEL BY PCR, STOOL (REPLACES STOOL CULTURE)

## 2021-01-30 LAB — BASIC METABOLIC PANEL
Anion gap: 5 (ref 5–15)
BUN: 17 mg/dL (ref 8–23)
CO2: 25 mmol/L (ref 22–32)
Calcium: 8.6 mg/dL — ABNORMAL LOW (ref 8.9–10.3)
Chloride: 108 mmol/L (ref 98–111)
Creatinine, Ser: 0.73 mg/dL (ref 0.44–1.00)
GFR, Estimated: >60 mL/min (ref >60)
Glucose, Bld: 95 mg/dL (ref 70–99)
Potassium: 3.9 mmol/L (ref 3.5–5.1)
Sodium: 138 mmol/L (ref 135–145)

## 2021-01-30 LAB — C DIFFICILE QUICK SCREEN W PCR REFLEX
C Diff antigen: POSITIVE — AB
C Diff interpretation: DETECTED
C Diff toxin: POSITIVE — AB

## 2021-01-30 MED ORDER — ENSURE ENLIVE PO LIQD
237.0000 mL | Freq: Three times a day (TID) | ORAL | Status: DC
  Administered 2021-01-30 – 2021-02-02 (×8): 237 mL via ORAL

## 2021-01-30 MED ORDER — COVID-19 MRNA VAC-TRIS(PFIZER) 30 MCG/0.3ML IM SUSP
0.3000 mL | Freq: Once | INTRAMUSCULAR | Status: AC
  Filled 2021-01-30: qty 0.3

## 2021-01-30 MED ORDER — VANCOMYCIN 50 MG/ML ORAL SOLUTION
125.0000 mg | Freq: Four times a day (QID) | ORAL | Status: DC
  Administered 2021-01-30 – 2021-01-31 (×5): 125 mg via ORAL
  Filled 2021-01-30 (×9): qty 2.5

## 2021-01-30 NOTE — TOC Initial Note (Signed)
Transition of Care [facility]) - Initial/Assessment Note    Patient Details  Name: Alicia Moran MRN: [id number] Date of Birth: 1928/11/03  Transition of Care [facility] [place]) CM/SW Contact:    Shelbie Hutching, RN Phone Number: 01/30/2021, 3:47 PM  Clinical Narrative:                 Patient admitted to the hospital with C diff.  RNCM met with patient at the bedside.  She reports that she can no longer care for herself at home, she can't walk.  Patient wants to go to [facility] for rehab and then for ALF.  [facility] at this time has declined bed offer due to no bed available.  Patient does not want Therapist, sports.   Patient is interested in [facility] also.  PT is recommending SNF.  RNCM will follow up with patient tomorrow on SNF and bed offers, so far bed offer from [facility].   Patient has been vaccinated x3.  She does not want the second booster while in the hospital.    Expected Discharge Plan: Skilled Nursing Facility Barriers to Discharge: Continued Medical Work up   Patient Goals and CMS Choice Patient states their goals for this hospitalization and ongoing recovery are:: Patient reports she cannot go back home and she wants to go to assisted living- she prefers [facility].gov Compare Post Acute Care list provided to:: Patient Choice offered to / list presented to : Patient  Expected Discharge Plan and Services Expected Discharge Plan: [place]   Discharge Planning Services: CM Consult Post Acute Care Choice: [place] Living arrangements for the past 2 months: Single Family Home                 DME Arranged: N/A DME Agency: NA       HH Arranged: NA          Prior Living Arrangements/Services Living arrangements for the past 2 months: Single Family Home Lives with:: Self Patient language and need for interpreter reviewed:: Yes Do you feel safe going back to the place where you live?: No   cannot  walker and cannot care for herself  Need for Family Participation in Patient Care: Yes (Comment) Care giver support system in place?: Yes (comment) (niece) Current home services: DME (3 wheeled walker) Criminal Activity/Legal Involvement Pertinent to Current Situation/Hospitalization: No - Comment as needed  Activities of Daily Living Home Assistive Devices/Equipment: Eyeglasses, Grab bars around toilet, Walker (specify type) ADL Screening (condition at time of admission) Patient's cognitive ability adequate to safely complete daily activities?: Yes Is the patient deaf or have difficulty hearing?: Yes Does the patient have difficulty seeing, even when wearing glasses/contacts?: Yes Does the patient have difficulty concentrating, remembering, or making decisions?: No Patient able to express need for assistance with ADLs?: Yes Does the patient have difficulty dressing or bathing?: No Independently performs ADLs?: Yes (appropriate for developmental age) Does the patient have difficulty walking or climbing stairs?: Yes Weakness of Legs: Both Weakness of Arms/Hands: Right  Permission Sought/Granted Permission sought to share information with : Case Manager, Family Supports, Chartered certified accountant granted to share information with : Yes, Verbal Permission Granted  Share Information with NAME: Annamaria Helling  Permission granted to share info w AGENCY: [place] granted to share info w Relationship: niece     Emotional Assessment Appearance:: Appears stated age, Well-Groomed Attitude/Demeanor/Rapport: Engaged Affect (typically observed): Accepting Orientation: :  Transition of Care [facility]) - Initial/Assessment Note    Patient Details  Name: Alicia Moran MRN: [id number] Date of Birth: March 24, 1929  Transition of Care [facility]) CM/SW Contact:    Shelbie Hutching, RN Phone Number: 01/30/2021, 3:47 PM  Clinical Narrative:                 Patient admitted to the hospital with C diff.  RNCM met with patient at the bedside.  She reports that she can no longer care for herself at home, she can't walk.  Patient wants to go to [facility] for rehab and then for ALF.  [facility] at this time has declined bed offer due to no bed available.  Patient does not want Therapist, sports.   Patient is interested in [facility] also.  PT is recommending SNF.  RNCM will follow up with patient tomorrow on SNF and bed offers, so far bed offer from [facility].   Patient has been vaccinated x3.  She does not want the second booster while in the hospital.    Expected Discharge Plan: Skilled Nursing Facility Barriers to Discharge: Continued Medical Work up   Patient Goals and CMS Choice Patient states their goals for this hospitalization and ongoing recovery are:: Patient reports she cannot go back home and she wants to go to assisted living- she prefers [facility].gov Compare Post Acute Care list provided to:: Patient Choice offered to / list presented to : Patient  Expected Discharge Plan and Services Expected Discharge Plan: [place]   Discharge Planning Services: CM Consult Post Acute Care Choice: [place] Living arrangements for the past 2 months: Single Family Home                 DME Arranged: N/A DME Agency: NA       HH Arranged: NA          Prior Living Arrangements/Services Living arrangements for the past 2 months: Single Family Home Lives with:: Self Patient language and need for interpreter reviewed:: Yes Do you feel safe going back to the place where you live?: No   cannot  walker and cannot care for herself  Need for Family Participation in Patient Care: Yes (Comment) Care giver support system in place?: Yes (comment) (niece) Current home services: DME (3 wheeled walker) Criminal Activity/Legal Involvement Pertinent to Current Situation/Hospitalization: No - Comment as needed  Activities of Daily Living Home Assistive Devices/Equipment: Eyeglasses, Grab bars around toilet, Walker (specify type) ADL Screening (condition at time of admission) Patient's cognitive ability adequate to safely complete daily activities?: Yes Is the patient deaf or have difficulty hearing?: Yes Does the patient have difficulty seeing, even when wearing glasses/contacts?: Yes Does the patient have difficulty concentrating, remembering, or making decisions?: No Patient able to express need for assistance with ADLs?: Yes Does the patient have difficulty dressing or bathing?: No Independently performs ADLs?: Yes (appropriate for developmental age) Does the patient have difficulty walking or climbing stairs?: Yes Weakness of Legs: Both Weakness of Arms/Hands: Right  Permission Sought/Granted Permission sought to share information with : Case Manager, Family Supports, Chartered certified accountant granted to share information with : Yes, Verbal Permission Granted  Share Information with NAME: Annamaria Helling  Permission granted to share info w AGENCY: [place] granted to share info w Relationship: niece     Emotional Assessment Appearance:: Appears stated age, Well-Groomed Attitude/Demeanor/Rapport: Engaged Affect (typically observed): Accepting Orientation: :

## 2021-01-30 NOTE — Progress Notes (Signed)
PROGRESS NOTE    Alicia Moran  KGM:[id number] DOB: 1929-02-22 DOA: 01/29/2021 PCP: [name], MD   Brief Narrative: Taken from H&P.  Alicia Moran is a 87 y.o. female with medical history significant for atrial fibrillation not on anticoagulation, hyperlipidemia, CVA , osteoarthritis, left knee pain who presented to the emergency department with multiple complaints.  Patient complaint of diarrhea and generalized weakness.  She does not think that she can manage herself by living alone at home.  Unable to walk and cook for herself.  Apparently husband and daughter died few years ago. Recently treated for UTI with cefuroxime by her PCP. History of C. difficile colitis in 2012 when she was in a nursing home for rehab. CT abdomen with mild right colitis.  C. difficile PCR came back positive. Received IV fluid for dehydration and increasing BUN and creatinine. Started on p.o. vancomycin per protocol.  Subjective: Patient was complaining of knee pain when seen this morning.  She was quite tearful that she tried her best to stay independent for few years after the death of her daughter, apparently husband passed in 2017 followed by Dr. In 2018.  She does not want to go back to her home as she cannot take care of herself at this point.  Assessment & Plan:   Active Problems:   Generalized weakness   Malnutrition of moderate degree   Colitis due to Clostridium difficile  C. difficile colitis, POA.  Admitted with diarrhea and worsening generalized weakness.  Recent antibiotic use and prior history of C. difficile in 2012. -Continue with p.o. vancomycin for 10 days per protocol-prior authorization obtained by pharmacy with a copay of $104.86 because she has a $500 deductible"  Elevated BUN.  Improved with IV fluid. -Discontinue IV fluids stool  Right knee pain.  S/p knee replacement.  Patient denies any falls. PT is recommending SNF placement. -Continue with as needed pain  management  Generalized weakness and physical debility.  With diarrhea and worsening osteoarthritis of knees.  PT is recommending SNF placement. -TOC consulted for rehab  History of atrial fibrillation.  Currently in sinus rhythm.  Not on any anticoagulation. -Continue home dose of metoprolol -Continue to monitor  Objective: Vitals:   01/30/21 0522 01/30/21 0523 01/30/21 0843 01/30/21 1137  BP: 131/66  (!) 114/54 111/72  Pulse: 74 75 79 67  Resp: 20  18 20   Temp: 97.7 F (36.5 C)  98.3 F (36.8 C) 98.3 F (36.8 C)  TempSrc:      SpO2: 95% 99% 95% 95%  Weight:      Height:        Intake/Output Summary (Last 24 hours) at 01/30/2021 1526 Last data filed at 01/30/2021 1500 Gross per 24 hour  Intake 2725.13 ml  Output 400 ml  Net 2325.13 ml   Filed Weights   01/29/21 0523  Weight: 60.3 kg    Examination:  General exam: Frail elderly lady appears calm and comfortable  Respiratory system: Clear to auscultation. Respiratory effort normal. Cardiovascular system: S1 & S2 heard, RRR. Marland Kitchen Gastrointestinal system: Soft, nontender, nondistended, bowel sounds positive. Central nervous system: Alert and oriented. No focal neurological deficits. Extremities: No edema, no cyanosis, pulses intact and symmetrical. Psychiatry: Judgement and insight appear normal.    DVT prophylaxis: SCDs Code Status: Full Family Communication: Discussed with patient. Disposition Plan:  Status is: Inpatient  Remains inpatient appropriate because:Inpatient level of care appropriate due to severity of illness  Dispo: The patient is from: Home  Anticipated d/c is to: SNF              Patient currently is not medically stable to d/c.   Difficult to place patient No                Level of care: Med-Surg  All the records are reviewed and case discussed with Care Management/Social Worker. Management plans discussed with the patient, nursing and they are in agreement.  Consultants:   None  Procedures:  Antimicrobials:  P.o. vancomycin  Data Reviewed: I have personally reviewed following labs and imaging studies  CBC: Recent Labs  Lab 01/29/21 0537  WBC 8.7  NEUTROABS 5.2  HGB 10.4*  HCT 30.2*  MCV 96.5  PLT 101*   Basic Metabolic Panel: Recent Labs  Lab 01/29/21 0537 01/29/21 1441 01/30/21 0700  NA 139  --  138  K 4.0  --  3.9  CL 103  --  108  CO2 24  --  25  GLUCOSE 109*  --  95  BUN 33*  --  17  CREATININE 0.88  --  0.73  CALCIUM 9.4  --  8.6*  MG  --  2.0  --    GFR: Estimated Creatinine Clearance: 40 mL/min (by C-G formula based on SCr of 0.73 mg/dL). Liver Function Tests: Recent Labs  Lab 01/29/21 0537  AST 18  ALT 12  ALKPHOS 68  BILITOT 0.9  PROT 7.5  ALBUMIN 4.2   Recent Labs  Lab 01/29/21 0537  LIPASE 34   No results for input(s): AMMONIA in the last 168 hours. Coagulation Profile: No results for input(s): INR, PROTIME in the last 168 hours. Cardiac Enzymes: No results for input(s): CKTOTAL, CKMB, CKMBINDEX, TROPONINI in the last 168 hours. BNP (last 3 results) No results for input(s): PROBNP in the last 8760 hours. HbA1C: No results for input(s): HGBA1C in the last 72 hours. CBG: No results for input(s): GLUCAP in the last 168 hours. Lipid Profile: No results for input(s): CHOL, HDL, LDLCALC, TRIG, CHOLHDL, LDLDIRECT in the last 72 hours. Thyroid Function Tests: No results for input(s): TSH, T4TOTAL, FREET4, T3FREE, THYROIDAB in the last 72 hours. Anemia Panel: No results for input(s): VITAMINB12, FOLATE, FERRITIN, TIBC, IRON, RETICCTPCT in the last 72 hours. Sepsis Labs: No results for input(s): PROCALCITON, LATICACIDVEN in the last 168 hours.  Recent Results (from the past 240 hour(s))  Resp Panel by RT-PCR (Flu A&B, Covid) Nasopharyngeal Swab     Status: None   Collection Time: 01/29/21  5:37 AM   Specimen: Nasopharyngeal Swab; Nasopharyngeal(NP) swabs in vial transport medium  Result Value Ref Range  Status   SARS Coronavirus 2 by RT PCR NEGATIVE NEGATIVE Final    Comment: (NOTE) SARS-CoV-2 target nucleic acids are NOT DETECTED.  The SARS-CoV-2 RNA is generally detectable in upper respiratory specimens during the acute phase of infection. The lowest concentration of SARS-CoV-2 viral copies this assay can detect is 138 copies/mL. A negative result does not preclude SARS-Cov-2 infection and should not be used as the sole basis for treatment or other patient management decisions. A negative result may occur with  improper specimen collection/handling, submission of specimen other than nasopharyngeal swab, presence of viral mutation(s) within the areas targeted by this assay, and inadequate number of viral copies(<138 copies/mL). A negative result must be combined with clinical observations, patient history, and epidemiological information. The expected result is Negative.  Fact Sheet for Patients:  BloggerCourse.com  Fact Sheet for Healthcare Providers:  SeriousBroker.it  This test is no t yet approved or cleared by the [place] and  has been authorized for detection and/or diagnosis of SARS-CoV-2 by FDA under an Emergency Use Authorization (EUA). This EUA will remain  in effect (meaning this test can be used) for the duration of the COVID-19 declaration under Section 564(b)(1) of the Act, 21 U.S.C.section 360bbb-3(b)(1), unless the authorization is terminated  or revoked sooner.       Influenza A by PCR NEGATIVE NEGATIVE Final   Influenza B by PCR NEGATIVE NEGATIVE Final    Comment: (NOTE) The Xpert Xpress SARS-CoV-2/FLU/RSV plus assay is intended as an aid in the diagnosis of influenza from Nasopharyngeal swab specimens and should not be used as a sole basis for treatment. Nasal washings and aspirates are unacceptable for Xpert Xpress SARS-CoV-2/FLU/RSV testing.  Fact Sheet for  Patients: BloggerCourse.com  Fact Sheet for Healthcare Providers: SeriousBroker.it  This test is not yet approved or cleared by the [place] FDA and has been authorized for detection and/or diagnosis of SARS-CoV-2 by FDA under an Emergency Use Authorization (EUA). This EUA will remain in effect (meaning this test can be used) for the duration of the COVID-19 declaration under Section 564(b)(1) of the Act, 21 U.S.C. section 360bbb-3(b)(1), unless the authorization is terminated or revoked.  Performed at [facility], [address] [place], [place] [address]   Gastrointestinal Panel by PCR , Stool     Status: None   Collection Time: 01/30/21 12:07 AM   Specimen: Stool  Result Value Ref Range Status   Campylobacter species NOT DETECTED NOT DETECTED Final   Plesimonas shigelloides NOT DETECTED NOT DETECTED Final   Salmonella species NOT DETECTED NOT DETECTED Final   Yersinia enterocolitica NOT DETECTED NOT DETECTED Final   Vibrio species NOT DETECTED NOT DETECTED Final   Vibrio cholerae NOT DETECTED NOT DETECTED Final   Enteroaggregative E coli (EAEC) NOT DETECTED NOT DETECTED Final   Enteropathogenic E coli (EPEC) NOT DETECTED NOT DETECTED Final   Enterotoxigenic E coli (ETEC) NOT DETECTED NOT DETECTED Final   Shiga like toxin producing E coli (STEC) NOT DETECTED NOT DETECTED Final   Shigella/Enteroinvasive E coli (EIEC) NOT DETECTED NOT DETECTED Final   Cryptosporidium NOT DETECTED NOT DETECTED Final   Cyclospora cayetanensis NOT DETECTED NOT DETECTED Final   Entamoeba histolytica NOT DETECTED NOT DETECTED Final   Giardia lamblia NOT DETECTED NOT DETECTED Final   Adenovirus F40/41 NOT DETECTED NOT DETECTED Final   Astrovirus NOT DETECTED NOT DETECTED Final   Norovirus GI/GII NOT DETECTED NOT DETECTED Final   Rotavirus A NOT DETECTED NOT DETECTED Final   Sapovirus (I, II, IV, and V) NOT DETECTED NOT DETECTED Final     Comment: Performed at [facility], [address] [place], [place] [address]  C Difficile Quick Screen w PCR reflex     Status: Abnormal   Collection Time: 01/30/21 12:07 AM   Specimen: STOOL  Result Value Ref Range Status   C Diff antigen POSITIVE (A) NEGATIVE Final   C Diff toxin POSITIVE (A) NEGATIVE Final   C Diff interpretation Toxin producing C. difficile detected.  Final    Comment: CRITICAL RESULT CALLED TO, READ BACK BY AND VERIFIED WITH: Wilford Sports AT 0208 ON 01/30/21 BY SS Performed at [facility], [address] [place], [place] [address]      Radiology Studies: CT ABDOMEN PELVIS W CONTRAST  Result Date: 01/29/2021 CLINICAL DATA:  87 year old female with abdominal distension and diarrhea. EXAM:  CT ABDOMEN AND PELVIS WITH CONTRAST TECHNIQUE: Multidetector CT imaging of the abdomen and pelvis was performed using the standard protocol following bolus administration of intravenous contrast. CONTRAST:  75mL OMNIPAQUE IOHEXOL 350 MG/ML SOLN COMPARISON:  CT Abdomen and Pelvis 12/07/2015. FINDINGS: Lower chest: Cardiomegaly. No pericardial effusion. Calcified coronary artery and aortic atherosclerosis. Mild lung base scarring is stable since 2017. No pleural effusion. Hepatobiliary: Stable liver with multiple scattered small circumscribed cysts and occasional calcified hepatic granulomas. Probable small gallstones on series 2, image 32 but otherwise negative gallbladder. Pancreas: Negative. Spleen: Negative. Adrenals/Urinary Tract: Adrenal glands are normal. Benign right upper pole renal cyst is stable. Nonobstructed kidneys. Symmetric renal enhancement and contrast excretion. Diminutive and unremarkable bladder. Numerous pelvic phleboliths. No convincing urinary calculus. Stomach/Bowel: Redundant large bowel. Decompressed transverse and rectosigmoid colon. But wall thickening and indistinct appearance of the large bowel from the distal transverse through the  descending segments (coronal image 27 and series 2, image 39) with up to mild mesenteric stranding. Proximal transverse colon, hepatic flexure, and right colon have a more normal appearance, although a portion of the redundant ascending colon also appears inflamed on coronal image 43. Questionable mild cecal inflammation. Appendix not identified, could be diminutive or absent. Terminal ileum is decompressed and not definitely inflamed. No dilated small bowel. Decompressed stomach. Duodenum is within normal limits. No free air or free fluid. Vascular/Lymphatic: Extensive Aortoiliac calcified atherosclerosis. Normal caliber abdominal aorta. Major aortic structures remain patent. Portal venous system is patent. No lymphadenopathy. Reproductive: Negative. Other: No pelvic free fluid. Musculoskeletal: Osteopenia. L2 compression fracture with inferior endplate deformity is stable since 2017. Previous proximal right femur ORIF. No acute osseous abnormality identified. IMPRESSION: 1. Evidence of Acute Colitis with intermittent inflamed large bowel from the right colon through the sigmoid, maximal from the distal transverse through the descending segments. Favor Infectious Colitis. No associated bowel obstruction, free fluid, or other complicating features. 2. No other acute or inflammatory process identified in the abdomen or pelvis. Cholelithiasis without evidence of acute cholecystitis. Cardiomegaly. Calcified coronary artery and Aortic Atherosclerosis (ICD10-I70.0). Chronic lumbar compression fracture. Electronically Signed   By: [place] Fleming M.D.   On: 01/29/2021 07:45   DG Knee Complete 4 Views Right  Result Date: 01/29/2021 CLINICAL DATA:  87 year old female with diarrhea for 2 weeks. Right knee pain. EXAM: RIGHT KNEE - COMPLETE 4+ VIEW COMPARISON:  Right femur series 06/11/2014. FINDINGS: Distal aspect of the chronic right femoral intramedullary rod with distal interlocking cortical screw. Visible hardware appears  intact. No joint effusion on the cross-table lateral view. Joint spaces and alignment are normal for age. Osteopenia. No acute osseous abnormality identified. Calcified peripheral vascular disease. IMPRESSION: 1. No acute osseous abnormality identified about the right knee. 2. Stable visible chronic right femoral intramedullary rod. 3. Calcified peripheral vascular disease. Electronically Signed   By: [place] Fleming M.D.   On: 01/29/2021 05:58    Scheduled Meds:  aspirin EC  81 mg Oral Daily   cholecalciferol  2,000 Units Oral Daily   [START ON 01/31/2021] COVID-19 mRNA Vac-TriS ([employer])  0.3 mL Intramuscular ONCE-1600   feeding supplement  237 mL Oral TID with meals   lidocaine  1 patch Transdermal Q24H   metoprolol succinate  50 mg Oral Daily   multivitamin-lutein   Oral Daily   simvastatin  10 mg Oral QHS   sodium chloride flush  3 mL Intravenous Q12H   vancomycin  125 mg Oral Q6H   Continuous Infusions:   LOS: 0 days  Time spent: 38 minutes. More than 50% of the time was spent in counseling/coordination of care  Arnetha Courser, MD [facility]  If 7PM-7AM, please contact night-coverage [email or web address]  01/30/2021, 3:26 PM   This record has been created using Conservation officer, historic buildings. Errors have been sought and corrected,but may not always be located. Such creation errors do not reflect on the standard of care. 38 minutes.

## 2021-01-30 NOTE — Progress Notes (Signed)
Initial Nutrition Assessment  DOCUMENTATION CODES:  Non-severe (moderate) malnutrition in context of social or environmental circumstances  INTERVENTION:  Continue current diet as ordered, encourage PO intake Ensure Enlive po BID, each supplement provides 350 kcal and 20 grams of protein  NUTRITION DIAGNOSIS:  Moderate Malnutrition (in the context of social/environmental circumstances) related to decreased appetite as evidenced by mild muscle depletion, mild fat depletion, severe muscle depletion.  GOAL:  Patient will meet greater than or equal to 90% of their needs  MONITOR:  PO intake, Supplement acceptance, Weight trends  REASON FOR ASSESSMENT:  Malnutrition Screening Tool    ASSESSMENT:  87 y.o. female with medical history of atrial fibrillation, HLD, hx of CVA presented to ED from home with ongoing diarrhea and weakness.  Pt requesting placement, reports she can no longer take care of herself at home and does not have family that can assist. Workup in ED suggestive of dehydration and mild colitis. Stool sample sent and pt found to be positive for Clostridium difficile infection.   Pt resting in bed at the time of assessment. States that she has been eating fairly well today, but not as much as she does at her baseline. Reports that appetite has been poor for a few weeks due to abdominal pain and diarrhea. Pt reported she is not able to cook for herself at home due to weakness and vision problems.   Muscle and fat deficits present on exam. Discussed nutrition supplements and pt reports that she like ensure and drinks them multiple times a day at home, prefers chocolate flavor and would like to receive them with her meals. Reports stable weight at ~130 lbs  Nutritionally Relevant Medications: Scheduled Meds:  cholecalciferol  2,000 Units Oral Daily   multivitamin-lutein   Oral Daily   simvastatin  10 mg Oral QHS   vancomycin  125 mg Oral Q6H   Continuous Infusions:  sodium  chloride 75 mL/hr at 01/30/21 0351   PRN Meds: famotidine  Labs Reviewed  NUTRITION - FOCUSED PHYSICAL EXAM: Flowsheet Row Most Recent Value  Orbital Region Mild depletion  Upper Arm Region No depletion  Thoracic and Lumbar Region No depletion  Buccal Region Mild depletion  Temple Region Mild depletion  Clavicle Bone Region Severe depletion  Clavicle and Acromion Bone Region Severe depletion  Scapular Bone Region Mild depletion  Dorsal Hand Severe depletion  Patellar Region Mild depletion  Anterior Thigh Region Mild depletion  Posterior Calf Region Mild depletion  Edema (RD Assessment) Mild  [right lower extremity]  Hair Reviewed  Eyes Reviewed  Mouth Reviewed  Skin Reviewed  Nails Reviewed   Diet Order:   Diet Order             Diet regular Room service appropriate? Yes; Fluid consistency: Thin  Diet effective now                  EDUCATION NEEDS:  No education needs have been identified at this time  Skin:  Skin Assessment: Reviewed RN Assessment  Last BM:  8/15 - type 3  Height:  Ht Readings from Last 1 Encounters:  01/29/21 '5\' 2"'$  (1.575 m)   Weight:  Wt Readings from Last 1 Encounters:  01/29/21 60.3 kg   Ideal Body Weight:  50 kg  BMI:  Body mass index is 24.31 kg/m.  Estimated Nutritional Needs:  Kcal:  1400-1600 kcal/d Protein:  70-80g/d Fluid:  1.5-1.8 L/d   Ranell Patrick, RD, LDN Clinical Dietitian Pager on [place]

## 2021-01-30 NOTE — TOC Benefit Eligibility Note (Signed)
Patient Advocate Encounter  Prior Authorization for Vancomycin 125 mg Capsules has been approved.    PA# [id number] Effective dates: 01/30/2021 through 06/17/2021  Patients co-pay is $104.86 due to a $500.00 deductible remaining.     Lyndel Safe, [place] Patient Advocate Specialist [place] Antimicrobial Stewardship Team Direct Number: [phone]  Fax: [phone]

## 2021-01-30 NOTE — TOC Benefit Eligibility Note (Signed)
Patient Advocate Encounter   Received notification that prior authorization for Vancomycin 125 mg Capsules is required.   PA submitted on 01/30/2021 Key [id number] Status is pending       Lyndel Safe, CPhT Pharmacy Patient Advocate Specialist [facility] Antimicrobial Stewardship Team Direct Number: [phone]  Fax: [phone]

## 2021-01-30 NOTE — Progress Notes (Signed)
Physical Therapy Treatment Patient Details Name: Alicia Moran MRN: [id number] DOB: 1929-06-16 Today's Date: 01/30/2021    History of Present Illness Pt is a 87 y.o. female with medical history significant for atrial fibrillation not on anticoagulation, hyperlipidemia, CVA , left knee pain who presented to the emergency department with multiple complaints.  Patient complaint of diarrhea for 2 weeks she stated she was told it was E. coli by her primary doctor.    PT Comments    Upon entry of room, pt very impulsive to get to [facility] due to reports of urgent need to have BM. PT intervened with modA to T/f pt from supine to seated EOB to SPT to [facility] with B HHA for safety. Pt indep with perihygiene and hand washing with set up needed for soap in hands due to inability to reach dispenser.  Use of RW to t/f from [facility] to seated EOB. MinA required with VC's for safe hand placement. Pt consistently reporting fatigue and remains having significant R knee pain with functional mobility albeit it appears pt is a little self limiting as with RW support pt required very little assist once standing. Pt did require cuing for sequencing RW to optimize safety of transfer back to EOB. Pt very impulsive to return to supine with no PT assistance needed. With cuing pt agreeable and willing to participate in supine therex despite reporting she is "very tired". Pt will still benefit from STR to optimize safety with OOB mobility with LRAD and improve overall strength and endurance to improve independence and safety with functional mobility.    Follow Up Recommendations  SNF     Equipment Recommendations  None recommended by PT    Recommendations for Other Services       Precautions / Restrictions Precautions Precautions: Fall Restrictions Weight Bearing Restrictions: No    Mobility  Bed Mobility Overal bed mobility: Needs Assistance Bed Mobility: Supine to Sit;Sit to Supine     Supine to sit: Min assist Sit  to supine: Min assist   General bed mobility comments: Rushed bed mobility due to urgent need to sit on [facility]    Transfers Overall transfer level: Needs assistance Equipment used: Rolling walker (2 wheeled) Transfers: Sit to/from Stand Sit to Stand: Min assist;Mod assist         General transfer comment: Overall MinA for stand pivot transfers. one with RW and one with no AD.  Ambulation/Gait Ambulation/Gait assistance: Min assist Gait Distance (Feet): 2 Feet Assistive device: Rolling walker (2 wheeled)       General Gait Details: Took steps in stand pivot returning to bed with RW.   Stairs             Wheelchair Mobility    Modified Rankin (Stroke Patients Only)       Balance Overall balance assessment: Needs assistance Sitting-balance support: Feet unsupported;Bilateral upper extremity supported Sitting balance-Leahy Scale: Fair       Standing balance-Leahy Scale: Poor Standing balance comment: reliant on UE support                            Cognition Arousal/Alertness: Awake/alert Behavior During Therapy: WFL for tasks assessed/performed Overall Cognitive Status: Within Functional Limits for tasks assessed                                 General Comments: Tearful when telling PT she  has no help at home d/t husband and child passing away      Exercises General Exercises - Lower Extremity Ankle Circles/Pumps: AROM;10 reps;Supine;Both Hip ABduction/ADduction: AROM;Supine;Strengthening;Both;10 reps Straight Leg Raises: AAROM;Strengthening;Both;10 reps Other Exercises Other Exercises: min<> modA for SPT with RW <> bed to [facility]    General Comments        Pertinent Vitals/Pain Pain Assessment: Faces Faces Pain Scale: Hurts little more Pain Location: R knee pain Pain Descriptors / Indicators: Grimacing;Guarding Pain Intervention(s): Limited activity within patient's tolerance;Monitored during session;Repositioned    Home  Living                      Prior Function            PT Goals (current goals can now be found in the care plan section) Acute Rehab PT Goals Patient Stated Goal: pt would like to transition to ALF for safety PT Goal Formulation: With patient Time For Goal Achievement: 02/12/21 Potential to Achieve Goals: Good Progress towards PT goals: Progressing toward goals    Frequency    Min 2X/week      PT Plan Current plan remains appropriate    Co-evaluation              AM-PAC PT "6 Clicks" Mobility   Outcome Measure  Help needed turning from your back to your side while in a flat bed without using bedrails?: None Help needed moving from lying on your back to sitting on the side of a flat bed without using bedrails?: A Little Help needed moving to and from a bed to a chair (including a wheelchair)?: A Little Help needed standing up from a chair using your arms (e.g., wheelchair or bedside chair)?: A Little Help needed to walk in hospital room?: A Little Help needed climbing 3-5 steps with a railing? : A Lot 6 Click Score: 18    End of Session Equipment Utilized During Treatment: Gait belt Activity Tolerance: Patient limited by pain Patient left: in bed;with call bell/phone within reach;with bed alarm set Nurse Communication: Mobility status PT Visit Diagnosis: Other abnormalities of gait and mobility (R26.89);Muscle weakness (generalized) (M62.81);Difficulty in walking, not elsewhere classified (R26.2);Pain Pain - Right/Left: Right Pain - part of body: Knee     Time: 1009-1046 PT Time Calculation (min) (ACUTE ONLY): 37 min  Charges:  $Therapeutic Exercise: 8-22 mins $Therapeutic Activity: 8-22 mins                    Charleen Madera M. Fairly IV, PT, DPT Physical Therapist- [place]  [facility]  01/30/2021, 11:39 AM

## 2021-01-31 MED ORDER — VANCOMYCIN 50 MG/ML ORAL SOLUTION
125.0000 mg | Freq: Four times a day (QID) | ORAL | Status: DC
  Administered 2021-01-31 – 2021-02-02 (×8): 125 mg via ORAL
  Filled 2021-01-31 (×11): qty 2.5

## 2021-01-31 NOTE — Progress Notes (Signed)
Physical Therapy Treatment Patient Details Name: Alicia Moran MRN: [id number] DOB: [birth year] Today's Date: 01/31/2021    History of Present Illness Pt is a 87 y.o. female with medical history significant for atrial fibrillation not on anticoagulation, hyperlipidemia, CVA , left knee pain who presented to the emergency department with multiple complaints.  Patient complaint of diarrhea for 2 weeks she stated she was told it was E. coli by her primary doctor.    PT Comments    Pt received upright in bed.  Agreeable to PT services. Performed supine therex to improve blood circulation and R knee joint mobility prior to attempting OOB mobility. Pt only requiring initial minA to from supine to seated EOB with HOB elevated and bed rails. As pt began to move, she only required supervision and cuing for hand placement. Able to stand to RW with bed elevated and minguard for safety. Pt displays post lean with x2 standing bouts despite multimodal cuing. On third standing attempt, pt SPT with RW to recliner <> bed with seated rest b/t bouts. minguard utilized with limited foot clearance with side steps due to pain with Wb'ing on RLE. Pt deferred walking at this point in time due to fatigue but does admit her R knee feels better after exercise. Pt even practiced x2 standing up EOB impulsively with no AD with Pt minguard to reduce falls risk. Pt returned to supine in bed. Nursing notified pt safe to SPT with RW to recliner for meals and toileting. Pt remains STR to progress safe OOB mobility with LRAD and improve independence with ADL's.   Follow Up Recommendations  SNF     Equipment Recommendations  None recommended by PT    Recommendations for Other Services       Precautions / Restrictions Precautions Precautions: Fall Restrictions Weight Bearing Restrictions: No    Mobility  Bed Mobility Overal bed mobility: Needs Assistance Bed Mobility: Supine to Sit     Supine to sit: Min  assist;Supervision;HOB elevated     General bed mobility comments: First needed minA but once pt began moving, able to sit EOb with supervision    Transfers Overall transfer level: Needs assistance Equipment used: Rolling walker (2 wheeled) Transfers: Sit to/from [employer] Sit to Stand: Min guard Stand pivot transfers: Min guard       General transfer comment: Remained minguard for all OOB mobility.  Ambulation/Gait Ambulation/Gait assistance: Min assist Gait Distance (Feet): 3 Feet Assistive device: Rolling walker (2 wheeled)       General Gait Details: Remains with SPT recliner <> bed with RW.   Stairs             Wheelchair Mobility    Modified Rankin (Stroke Patients Only)       Balance Overall balance assessment: Needs assistance Sitting-balance support: Feet unsupported;Bilateral upper extremity supported Sitting balance-Leahy Scale: Fair       Standing balance-Leahy Scale: Poor Standing balance comment: reliant on UE support. Noted post lean during today's session. Poor ability to correct despite multimodal cuing.                            Cognition Arousal/Alertness: Awake/alert Behavior During Therapy: WFL for tasks assessed/performed Overall Cognitive Status: Within Functional Limits for tasks assessed  Exercises General Exercises - Lower Extremity Ankle Circles/Pumps: AROM;10 reps;Supine;Both Quad Sets: AROM;Supine;Right;10 reps Gluteal Sets: AROM;Supine;Right;Both;10 reps Long Arc Quad: AROM;Strengthening;Both;10 reps Hip ABduction/ADduction: AROM;Supine;Strengthening;Both;10 reps Straight Leg Raises: AAROM;Strengthening;Both;10 reps Hip Flexion/Marching: AROM;5 reps;Both;Standing    General Comments        Pertinent Vitals/Pain Pain Assessment: Faces Faces Pain Scale: Hurts little more Pain Location: R knee pain Pain Descriptors / Indicators:  Grimacing;Guarding Pain Intervention(s): Limited activity within patient's tolerance;Monitored during session;Repositioned    Home Living                      Prior Function            PT Goals (current goals can now be found in the care plan section) Acute Rehab PT Goals Patient Stated Goal: pt would like to transition to ALF for safety PT Goal Formulation: With patient Time For Goal Achievement: 02/12/21 Potential to Achieve Goals: Good Progress towards PT goals: Progressing toward goals    Frequency    Min 2X/week      PT Plan Current plan remains appropriate    Co-evaluation              AM-PAC PT "6 Clicks" Mobility   Outcome Measure  Help needed turning from your back to your side while in a flat bed without using bedrails?: None Help needed moving from lying on your back to sitting on the side of a flat bed without using bedrails?: A Little Help needed moving to and from a bed to a chair (including a wheelchair)?: A Little Help needed standing up from a chair using your arms (e.g., wheelchair or bedside chair)?: A Little Help needed to walk in hospital room?: A Lot Help needed climbing 3-5 steps with a railing? : A Lot 6 Click Score: 17    End of Session Equipment Utilized During Treatment: Gait belt Activity Tolerance: Patient tolerated treatment well;Patient limited by fatigue Patient left: in bed;with call bell/phone within reach;with bed alarm set Nurse Communication: Mobility status PT Visit Diagnosis: Other abnormalities of gait and mobility (R26.89);Muscle weakness (generalized) (M62.81);Difficulty in walking, not elsewhere classified (R26.2);Pain Pain - Right/Left: Right Pain - part of body: Knee     Time: 9562-1308 PT Time Calculation (min) (ACUTE ONLY): 31 min  Charges:  $Therapeutic Exercise: 8-22 mins $Therapeutic Activity: 8-22 mins                     Brae Schaafsma M. Fairly IV, PT, DPT Physical Therapist- [place]  [facility]  01/31/2021, 3:31 PM

## 2021-01-31 NOTE — Progress Notes (Signed)
PROGRESS NOTE    Alicia Moran  ZSW:[id number] DOB: 09-12-1928 DOA: 01/29/2021 PCP: [name], MD   Brief Narrative: Taken from H&P.  Alicia Moran is a 87 y.o. female with medical history significant for atrial fibrillation not on anticoagulation, hyperlipidemia, CVA , osteoarthritis, left knee pain who presented to the emergency department with multiple complaints.  Patient complaint of diarrhea and generalized weakness.  She does not think that she can manage herself by living alone at home.  Unable to walk and cook for herself.  Apparently husband and daughter died few years ago. Recently treated for UTI with cefuroxime by her PCP. History of C. difficile colitis in 2012 when she was in a nursing home for rehab. CT abdomen with mild right colitis.  C. difficile PCR came back positive. Received IV fluid for dehydration and increasing BUN and creatinine. Started on p.o. vancomycin per protocol. Per regular Medicare protocol she needs to be in hospital for 3 nights in order to go to SNF. Should be able to leave on Thursday, 02/02/2021  Subjective: Diarrhea seems improving.  No bowel movement since this morning.  Eating and drinking okay.  She was very anxious and worried that she cannot live alone and take care of herself.  Reassured again that we are working on sending her to a rehab but she has to stay 2 more nights in the hospital. Wants to talk with her niece herself.  Assessment & Plan:   Active Problems:   Generalized weakness   Malnutrition of moderate degree   Colitis due to Clostridium difficile   Acute pain of right knee  C. difficile colitis, POA.  Admitted with diarrhea and worsening generalized weakness.  Recent antibiotic use and prior history of C. difficile in 2012. -Continue with p.o. vancomycin for 10 days per protocol-prior authorization obtained by pharmacy with a copay of $104.86 because she has a $500 deductible"  Elevated BUN.  Improved with IV  fluid. -Discontinue IV fluids stool  Right knee pain.  S/p knee replacement.  Patient denies any falls. PT is recommending SNF placement. -Continue with as needed pain management  Generalized weakness and physical debility.  With diarrhea and worsening osteoarthritis of knees.  PT is recommending SNF placement. -TOC consulted for rehab -Will need to stay 3 nights per Medicare protocol-should be able to leave on Thursday.  History of atrial fibrillation.  Currently in sinus rhythm.  Not on any anticoagulation. -Continue home dose of metoprolol -Continue to monitor  Objective: Vitals:   01/31/21 0549 01/31/21 0727 01/31/21 1218 01/31/21 1505  BP: 133/73 139/71 (!) 162/74 138/64  Pulse: 87 65 76 75  Resp: 18 18 18 20   Temp: 98 F (36.7 C) 97.7 F (36.5 C) 97.6 F (36.4 C) 97.8 F (36.6 C)  TempSrc:      SpO2: 98% 96% 95% 95%  Weight:      Height:        Intake/Output Summary (Last 24 hours) at 01/31/2021 1611 Last data filed at 01/31/2021 1218 Gross per 24 hour  Intake 3 ml  Output 1075 ml  Net -1072 ml    Filed Weights   01/29/21 0523  Weight: 60.3 kg    Examination:  General.  Frail elderly lady, in no acute distress. Pulmonary.  Lungs clear bilaterally, normal respiratory effort. CV.  Regular rate and rhythm, no JVD, rub or murmur. Abdomen.  Soft, nontender, nondistended, BS positive. CNS.  Alert and oriented .  No focal neurologic deficit. Extremities.  No edema, no cyanosis, pulses intact and symmetrical. Psychiatry.  Judgment and insight appears normal.    DVT prophylaxis: SCDs Code Status: Full Family Communication: Discussed with patient.  She wants to discuss herself with her family and does not want me to give her needs a call at this time. Disposition Plan:  Status is: Inpatient  Remains inpatient appropriate because:Inpatient level of care appropriate due to severity of illness  Dispo: The patient is from: Home              Anticipated d/c is  to: SNF              Patient currently is not medically stable to d/c.   Difficult to place patient No                Level of care: Med-Surg  All the records are reviewed and case discussed with Care Management/Social Worker. Management plans discussed with the patient, nursing and they are in agreement.  Consultants:  None  Procedures:  Antimicrobials:  P.o. vancomycin  Data Reviewed: I have personally reviewed following labs and imaging studies  CBC: Recent Labs  Lab 01/29/21 0537  WBC 8.7  NEUTROABS 5.2  HGB 10.4*  HCT 30.2*  MCV 96.5  PLT 101*    Basic Metabolic Panel: Recent Labs  Lab 01/29/21 0537 01/29/21 1441 01/30/21 0700  NA 139  --  138  K 4.0  --  3.9  CL 103  --  108  CO2 24  --  25  GLUCOSE 109*  --  95  BUN 33*  --  17  CREATININE 0.88  --  0.73  CALCIUM 9.4  --  8.6*  MG  --  2.0  --     GFR: Estimated Creatinine Clearance: 40 mL/min (by C-G formula based on SCr of 0.73 mg/dL). Liver Function Tests: Recent Labs  Lab 01/29/21 0537  AST 18  ALT 12  ALKPHOS 68  BILITOT 0.9  PROT 7.5  ALBUMIN 4.2    Recent Labs  Lab 01/29/21 0537  LIPASE 34    No results for input(s): AMMONIA in the last 168 hours. Coagulation Profile: No results for input(s): INR, PROTIME in the last 168 hours. Cardiac Enzymes: No results for input(s): CKTOTAL, CKMB, CKMBINDEX, TROPONINI in the last 168 hours. BNP (last 3 results) No results for input(s): PROBNP in the last 8760 hours. HbA1C: No results for input(s): HGBA1C in the last 72 hours. CBG: No results for input(s): GLUCAP in the last 168 hours. Lipid Profile: No results for input(s): CHOL, HDL, LDLCALC, TRIG, CHOLHDL, LDLDIRECT in the last 72 hours. Thyroid Function Tests: No results for input(s): TSH, T4TOTAL, FREET4, T3FREE, THYROIDAB in the last 72 hours. Anemia Panel: No results for input(s): VITAMINB12, FOLATE, FERRITIN, TIBC, IRON, RETICCTPCT in the last 72 hours. Sepsis Labs: No results  for input(s): PROCALCITON, LATICACIDVEN in the last 168 hours.  Recent Results (from the past 240 hour(s))  Resp Panel by RT-PCR (Flu A&B, Covid) Nasopharyngeal Swab     Status: None   Collection Time: 01/29/21  5:37 AM   Specimen: Nasopharyngeal Swab; Nasopharyngeal(NP) swabs in vial transport medium  Result Value Ref Range Status   SARS Coronavirus 2 by RT PCR NEGATIVE NEGATIVE Final    Comment: (NOTE) SARS-CoV-2 target nucleic acids are NOT DETECTED.  The SARS-CoV-2 RNA is generally detectable in upper respiratory specimens during the acute phase of infection. The lowest concentration of SARS-CoV-2 viral copies this assay can detect is  138 copies/mL. A negative result does not preclude SARS-Cov-2 infection and should not be used as the sole basis for treatment or other patient management decisions. A negative result may occur with  improper specimen collection/handling, submission of specimen other than nasopharyngeal swab, presence of viral mutation(s) within the areas targeted by this assay, and inadequate number of viral copies(<138 copies/mL). A negative result must be combined with clinical observations, patient history, and epidemiological information. The expected result is Negative.  Fact Sheet for Patients:  BloggerCourse.com  Fact Sheet for Healthcare Providers:  SeriousBroker.it  This test is no t yet approved or cleared by the [place] FDA and  has been authorized for detection and/or diagnosis of SARS-CoV-2 by FDA under an Emergency Use Authorization (EUA). This EUA will remain  in effect (meaning this test can be used) for the duration of the COVID-19 declaration under Section 564(b)(1) of the Act, 21 U.S.C.section 360bbb-3(b)(1), unless the authorization is terminated  or revoked sooner.       Influenza A by PCR NEGATIVE NEGATIVE Final   Influenza B by PCR NEGATIVE NEGATIVE Final    Comment: (NOTE) The  Xpert Xpress SARS-CoV-2/FLU/RSV plus assay is intended as an aid in the diagnosis of influenza from Nasopharyngeal swab specimens and should not be used as a sole basis for treatment. Nasal washings and aspirates are unacceptable for Xpert Xpress SARS-CoV-2/FLU/RSV testing.  Fact Sheet for Patients: BloggerCourse.com  Fact Sheet for Healthcare Providers: SeriousBroker.it  This test is not yet approved or cleared by the [place] FDA and has been authorized for detection and/or diagnosis of SARS-CoV-2 by FDA under an Emergency Use Authorization (EUA). This EUA will remain in effect (meaning this test can be used) for the duration of the COVID-19 declaration under Section 564(b)(1) of the Act, 21 U.S.C. section 360bbb-3(b)(1), unless the authorization is terminated or revoked.  Performed at [facility], [address] [place], [place] [address]   Gastrointestinal Panel by PCR , Stool     Status: None   Collection Time: 01/30/21 12:07 AM   Specimen: Stool  Result Value Ref Range Status   Campylobacter species NOT DETECTED NOT DETECTED Final   Plesimonas shigelloides NOT DETECTED NOT DETECTED Final   Salmonella species NOT DETECTED NOT DETECTED Final   Yersinia enterocolitica NOT DETECTED NOT DETECTED Final   Vibrio species NOT DETECTED NOT DETECTED Final   Vibrio cholerae NOT DETECTED NOT DETECTED Final   Enteroaggregative E coli (EAEC) NOT DETECTED NOT DETECTED Final   Enteropathogenic E coli (EPEC) NOT DETECTED NOT DETECTED Final   Enterotoxigenic E coli (ETEC) NOT DETECTED NOT DETECTED Final   Shiga like toxin producing E coli (STEC) NOT DETECTED NOT DETECTED Final   Shigella/Enteroinvasive E coli (EIEC) NOT DETECTED NOT DETECTED Final   Cryptosporidium NOT DETECTED NOT DETECTED Final   Cyclospora cayetanensis NOT DETECTED NOT DETECTED Final   Entamoeba histolytica NOT DETECTED NOT DETECTED Final   Giardia  lamblia NOT DETECTED NOT DETECTED Final   Adenovirus F40/41 NOT DETECTED NOT DETECTED Final   Astrovirus NOT DETECTED NOT DETECTED Final   Norovirus GI/GII NOT DETECTED NOT DETECTED Final   Rotavirus A NOT DETECTED NOT DETECTED Final   Sapovirus (I, II, IV, and V) NOT DETECTED NOT DETECTED Final    Comment: Performed at [facility], [address] [place], [place] [address]  C Difficile Quick Screen w PCR reflex     Status: Abnormal   Collection Time: 01/30/21 12:07 AM   Specimen: STOOL  Result Value Ref Range Status   C Diff antigen POSITIVE (A) NEGATIVE Final   C Diff toxin POSITIVE (A) NEGATIVE Final   C Diff interpretation Toxin producing C. difficile detected.  Final    Comment: CRITICAL RESULT CALLED TO, READ BACK BY AND VERIFIED WITH: Wilford Sports AT 0208 ON 01/30/21 BY SS Performed at [facility], [address], [place], [place] [address]       Radiology Studies: No results found.  Scheduled Meds:  aspirin EC  81 mg Oral Daily   cholecalciferol  2,000 Units Oral Daily   COVID-19 mRNA Vac-TriS (Pfizer)  0.3 mL Intramuscular ONCE-1600   feeding supplement  237 mL Oral TID with meals   lidocaine  1 patch Transdermal Q24H   metoprolol succinate  50 mg Oral Daily   multivitamin-lutein   Oral Daily   simvastatin  10 mg Oral QHS   sodium chloride flush  3 mL Intravenous Q12H   vancomycin  125 mg Oral QID   Continuous Infusions:   LOS: 1 day   Time spent: 33 minutes. More than 50% of the time was spent in counseling/coordination of care  Arnetha Courser, MD [facility]  If 7PM-7AM, please contact night-coverage [email or web address]  01/31/2021, 4:11 PM   This record has been created using Conservation officer, historic buildings. Errors have been sought and corrected,but may not always be located. Such creation errors do not reflect on the standard of care. 38 minutes.

## 2021-02-01 LAB — IRON AND TIBC
Iron: 62 ug/dL (ref 28–170)
Saturation Ratios: 23 % (ref 10.4–31.8)
TIBC: 276 ug/dL (ref 250–450)
UIBC: 214 ug/dL

## 2021-02-01 LAB — VITAMIN D 25 HYDROXY (VIT D DEFICIENCY, FRACTURES): Vit D, 25-Hydroxy: 52.61 ng/mL (ref 30–100)

## 2021-02-01 LAB — FOLATE: Folate: 28 ng/mL (ref >5.9)

## 2021-02-01 LAB — VITAMIN B12: Vitamin B-12: 558 pg/mL (ref 180–914)

## 2021-02-01 MED ORDER — ENOXAPARIN SODIUM 40 MG/0.4ML IJ SOSY
40.0000 mg | PREFILLED_SYRINGE | INTRAMUSCULAR | Status: DC
  Administered 2021-02-01: 23:00:00 40 mg via SUBCUTANEOUS
  Filled 2021-02-01: qty 0.4

## 2021-02-01 NOTE — TOC Progression Note (Signed)
Transition of Care [facility]) - Progression Note    Patient Details  Name: Alicia Moran MRN: [id number] Date of Birth: 16-Nov-1928  Transition of Care [facility] CM/SW Contact  Shelbie Hutching, RN Phone Number: 02/01/2021, 2:57 PM  Clinical Narrative:    Patient accepts bed offer for [facility].  Potentially ready for discharge to SNF tomorrow.     Expected Discharge Plan: [place] Barriers to Discharge: Continued Medical Work up  Expected Discharge Plan and Services Expected Discharge Plan: [place]   Discharge Planning Services: CM Consult Post Acute Care Choice: [place] Living arrangements for the past 2 months: Single Family Home                 DME Arranged: N/A DME Agency: NA       HH Arranged: NA           Social Determinants of Health (SDOH) Interventions    Readmission Risk Interventions No flowsheet data found.

## 2021-02-01 NOTE — Progress Notes (Signed)
[facility] Progress Note  Patient: Alicia Moran    [id number]  DOA: 01/29/2021     Date of Service: the patient was seen and examined on 02/01/2021  Chief Complaint  Patient presents with   Diarrhea   Knee Pain   Brief hospital course: Taken from H&P and previous PN  Alicia Moran is a 87 y.o. female with medical history significant for atrial fibrillation not on anticoagulation, hyperlipidemia, CVA , osteoarthritis, left knee pain who presented to the emergency department with multiple complaints.  Patient complaint of diarrhea and generalized weakness.  She does not think that she can manage herself by living alone at home.  Unable to walk and cook for herself.  Apparently husband and daughter died few years ago. Recently treated for UTI with cefuroxime by her PCP. History of C. difficile colitis in 2012 when she was in a nursing home for rehab. CT abdomen with mild right colitis.  C. difficile PCR came back positive. Received IV fluid for dehydration and increasing BUN and creatinine. Started on p.o. vancomycin per protocol. Per regular Medicare protocol she needs to be in hospital for 3 nights in order to go to SNF. Should be able to leave on Thursday, 02/02/2021   Assessment and Plan: Active Problems:   Generalized weakness   Malnutrition of moderate degree   Colitis due to Clostridium difficile   Acute pain of right knee   C. difficile colitis, POA.  Admitted with diarrhea and worsening generalized weakness.  Recent antibiotic use and prior history of C. difficile in 2012. -Continue with p.o. vancomycin for 10 days per protocol-prior authorization obtained by pharmacy with a copay of $104.86 because she has a $500 deductible"  Elevated BUN.  Improved with IV fluid. -Discontinue IV fluids stool   Right knee pain.  S/p knee replacement.  Patient denies any falls. PT is recommending SNF placement. -Continue with as needed pain management  Generalized  weakness and physical debility.  With diarrhea and worsening osteoarthritis of knees.  PT is recommending SNF placement. -TOC consulted for rehab -Will need to stay 3 nights per Medicare protocol-should be able to leave on Thursday.   History of atrial fibrillation.  Currently in sinus rhythm.  Not on any anticoagulation. -Continue home dose of metoprolol -Continue to monitor   Body mass index is 24.31 kg/m.  Nutrition Problem: Moderate Malnutrition (in the context of social/environmental circumstances) Etiology: decreased appetite Interventions: Interventions: Ensure Enlive (each supplement provides 350kcal and 20 grams of protein), MVI     Diet: Regular diet DVT Prophylaxis: Subcutaneous Lovenox   Advance goals of care discussion: Full code  Family Communication: family was not present at bedside, at the time of interview.  The pt provided permission to discuss medical plan with the family. Opportunity was given to ask question and all questions were answered satisfactorily.   Disposition:  Pt is from home, admitted with colitis, medically stable, patient can be discharged to SNF tomorrow a.m.  Subjective: No overnight issues, patient denies any abdominal pain, no more diarrhea for the past 2 days.  Patient denies nausea vomiting.  Tolerating diet well.  Patient was just feeling generalized weakness and was asking when she can be discharged.  Most likely she can go tomorrow  Physical Exam: General:  alert, NAD.  Appear in no distress, affect appropriate Eyes: PERRLA ENT: Oral Mucosa Clear, moist  Neck: no JVD,  Cardiovascular: S1 and S2 Present, no Murmur,  Respiratory: good respiratory effort, Bilateral Air entry equal  and Decreased, no Crackles, no wheezes Abdomen: Bowel Sound present, Soft and no tenderness,  Skin: no rashes Extremities: no Pedal edema, no calf tenderness Neurologic: without any new focal findings Gait not checked due to patient safety  concerns  Vitals:   02/01/21 0032 02/01/21 0425 02/01/21 0925 02/01/21 1159  BP: 135/63 131/66 (!) 148/77 109/73  Pulse: 75 69 71 98  Resp: '18 17 16 17  '$ Temp: 98.4 F (36.9 C) 97.7 F (36.5 C) 98.7 F (37.1 C) (!) 97.5 F (36.4 C)  TempSrc:      SpO2: 96% 94% 95% 96%  Weight:      Height:        Intake/Output Summary (Last 24 hours) at 02/01/2021 1519 Last data filed at 02/01/2021 0427 Gross per 24 hour  Intake --  Output 800 ml  Net -800 ml   Filed Weights   01/29/21 0523  Weight: 60.3 kg    Data Reviewed: I have personally reviewed and interpreted daily labs, tele strips, imagings as discussed above. I reviewed all nursing notes, pharmacy notes, vitals, pertinent old records I have discussed plan of care as described above with RN and patient/family.  CBC: Recent Labs  Lab 01/29/21 0537  WBC 8.7  NEUTROABS 5.2  HGB 10.4*  HCT 30.2*  MCV 96.5  PLT [id number]*   Basic Metabolic Panel: Recent Labs  Lab 01/29/21 0537 01/29/21 1441 01/30/21 0700  NA 139  --  138  K 4.0  --  3.9  CL 103  --  108  CO2 24  --  25  GLUCOSE 109*  --  95  BUN 33*  --  17  CREATININE 0.88  --  0.73  CALCIUM 9.4  --  8.6*  MG  --  2.0  --     Studies: No results found.  Scheduled Meds:  aspirin EC  81 mg Oral Daily   cholecalciferol  2,000 Units Oral Daily   COVID-19 mRNA Vac-TriS (Pfizer)  0.3 mL Intramuscular ONCE-1600   enoxaparin (LOVENOX) injection  40 mg Subcutaneous Q24H   feeding supplement  237 mL Oral TID with meals   lidocaine  1 patch Transdermal Q24H   metoprolol succinate  50 mg Oral Daily   multivitamin-lutein   Oral Daily   simvastatin  10 mg Oral QHS   sodium chloride flush  3 mL Intravenous Q12H   vancomycin  125 mg Oral QID   Continuous Infusions: PRN Meds: acetaminophen, ALPRAZolam, famotidine, morphine injection  Time spent: 35 minutes  Author: Val Riles. MD [facility] 02/01/2021 3:19 PM  To reach On-call, see care teams to locate the  attending and reach out to them via [email or web address]. If 7PM-7AM, please contact night-coverage If you still have difficulty reaching the attending provider, please page the [facility] (Director on Call) for [facility] on amion for assistance.

## 2021-02-02 LAB — BASIC METABOLIC PANEL
Anion gap: 7 (ref 5–15)
BUN: 24 mg/dL — ABNORMAL HIGH (ref 8–23)
CO2: 27 mmol/L (ref 22–32)
Calcium: 9 mg/dL (ref 8.9–10.3)
Chloride: 104 mmol/L (ref 98–111)
Creatinine, Ser: 0.82 mg/dL (ref 0.44–1.00)
GFR, Estimated: >60 mL/min (ref >60)
Glucose, Bld: 96 mg/dL (ref 70–99)
Potassium: 4.4 mmol/L (ref 3.5–5.1)
Sodium: 138 mmol/L (ref 135–145)

## 2021-02-02 LAB — CBC
HCT: 28.6 % — ABNORMAL LOW (ref 36.0–46.0)
Hemoglobin: 9.9 g/dL — ABNORMAL LOW (ref 12.0–15.0)
MCH: 33.1 pg (ref 26.0–34.0)
MCHC: 34.6 g/dL (ref 30.0–36.0)
MCV: 95.7 fL (ref 80.0–100.0)
Platelets: 113 10*3/uL — ABNORMAL LOW (ref 150–400)
RBC: 2.99 MIL/uL — ABNORMAL LOW (ref 3.87–5.11)
RDW: 13.3 % (ref 11.5–15.5)
WBC: 5.7 10*3/uL (ref 4.0–10.5)
nRBC: 0 % (ref 0.0–0.2)

## 2021-02-02 LAB — RESP PANEL BY RT-PCR (FLU A&B, COVID) ARPGX2
Influenza A by PCR: NEGATIVE
Influenza B by PCR: NEGATIVE
SARS Coronavirus 2 by RT PCR: NEGATIVE

## 2021-02-02 LAB — PHOSPHORUS: Phosphorus: 3.7 mg/dL (ref 2.5–4.6)

## 2021-02-02 LAB — MAGNESIUM: Magnesium: 2.1 mg/dL (ref 1.7–2.4)

## 2021-02-02 MED ORDER — VANCOMYCIN HCL 125 MG PO CAPS: 125.0000 mg | ORAL_CAPSULE | Freq: Four times a day (QID) | ORAL | 0 refills | Status: AC

## 2021-02-02 MED ORDER — ALPRAZOLAM 0.25 MG PO TABS: 0.1250 mg | ORAL_TABLET | Freq: Two times a day (BID) | ORAL | 0 refills | Status: DC | PRN

## 2021-02-02 NOTE — TOC Transition Note (Signed)
Transition of Care [facility]) - CM/SW Discharge Note   Patient Details  Name: Alicia Moran MRN: [id number] Date of Birth: [birth year]  Transition [facility] Care [facility]) CM/SW Contact:  Shelbie Hutching, RN Phone Number: 02/02/2021, 11:57 AM   Clinical Narrative:    Patient medically cleared for discharge to [facility] today.  Patient will be going to room 604B.  Bedside RN will call report.  Once discharge completed RNCM will arrange EMS transport.    Final next level of care: Skilled Nursing Facility Barriers to Discharge: Barriers Resolved   Patient Goals and CMS Choice Patient states their goals for this hospitalization and ongoing recovery are:: Patient reports she cannot go back home and she wants to go to assisted living- she prefers [facility].gov Compare Post Acute Care list provided to:: Patient Choice offered to / list presented to : Patient  Discharge Placement   Existing PASRR number confirmed : 01/30/21          Patient chooses bed at: [facility] [place] Patient to be transferred to facility by: [facility] Name of family member notified: patient notified Patient and family notified of of transfer: 02/02/21  Discharge Plan and Services   Discharge Planning Services: CM Consult Post Acute Care Choice: [place]          DME Arranged: N/A DME Agency: NA       HH Arranged: NA          Social Determinants of Health (SDOH) Interventions     Readmission Risk Interventions No flowsheet data found.

## 2021-02-02 NOTE — TOC Progression Note (Signed)
Transition of Care [facility]) - Progression Note    Patient Details  Name: Alicia Moran MRN: [id number] Date of Birth: 09/18/1928  Transition of Care [facility]) CM/SW Contact  Shelbie Hutching, RN Phone Number: 02/02/2021, 1:28 PM  Clinical Narrative:    RNCM has arranged EMS transport.  Patient is 2nd on the list for pick up   Expected Discharge Plan: [place] Barriers to Discharge: Barriers Resolved  Expected Discharge Plan and Services Expected Discharge Plan: [place]   Discharge Planning Services: CM Consult Post Acute Care Choice: [place] Living arrangements for the past 2 months: Single Family Home Expected Discharge Date: 02/02/21               DME Arranged: N/A DME Agency: NA       HH Arranged: NA           Social Determinants of Health (SDOH) Interventions    Readmission Risk Interventions No flowsheet data found.

## 2021-02-02 NOTE — Discharge Summary (Signed)
[facility] Discharge Summary   Patient: Alicia Moran [id number]  PCP: Albina Billet, MD  Date of admission: 01/29/2021   Date of discharge:  02/02/2021     Discharge Diagnoses:  Principal diagnosis C. difficile colitis Active Problems:   Generalized weakness   Malnutrition of moderate degree   Colitis due to Clostridium difficile   Acute pain of right knee   Admitted From: Home Disposition:  SNF   Recommendations for Outpatient Follow-up:  PCP: in 1 wk Follow up LABS/TEST:     Contact information for after-discharge care     Destination     [facility] Preferred SNF .   Service: Skilled Nursing Contact information: [place] [phone]                    Diet recommendation: Cardiac diet  Activity: The patient is advised to gradually reintroduce usual activities, as tolerated  Discharge Condition: stable  Code Status: Full code   History of present illness: As per the H and P dictated on admission Hospital Course:  Alicia Moran is a 87 y.o. female with medical history significant for atrial fibrillation not on anticoagulation, hyperlipidemia, CVA , osteoarthritis, left knee pain who presented to the emergency department with multiple complaints.  Patient complaint of diarrhea and generalized weakness.  She does not think that she can manage herself by living alone at home.  Unable to walk and cook for herself.  Apparently husband and daughter died few years ago. Recently treated for UTI with cefuroxime by her PCP. History of C. difficile colitis in 2012 when she was in a nursing home for rehab. CT abdomen with mild right colitis.  C. difficile PCR came back positive. Received IV fluid for dehydration and increasing BUN and creatinine. Started on p.o. vancomycin per protocol. Per regular Medicare protocol she needs to be in hospital for 3 nights in order to go to SNF. So  patient is being discharged to SNF today Active Problems:   Generalized weakness   Malnutrition of moderate degree   Colitis due to Clostridium difficile   Acute pain of right knee   # C. difficile colitis, POA.  Admitted with diarrhea and worsening generalized weakness.  Recent antibiotic use and prior history of C. difficile in 2012. -Continue with p.o. vancomycin for 10 days per protocol-prior authorization obtained by pharmacy with a copay of $104.86 because she has a $500 deductible" Elevated BUN.  Improved with IV fluid. -Discontinue IV fluids continue oral hydration Right knee pain.  S/p knee replacement.  Patient denies any falls. PT is recommending SNF placement. -Continue with as needed pain management Generalized weakness and physical debility.  With diarrhea and worsening osteoarthritis of knees.  PT is recommending SNF placement. -TOC consulted for rehab. -Will need to stay 3 nights per Medicare protocol-so patient being discharged today. History of atrial fibrillation.  Currently in sinus rhythm.  Not on any anticoagulation. -Continue home dose of metoprolol, Continue to monitor BP and HR  Body mass index is 24.31 kg/m.  Nutrition Problem: Moderate Malnutrition (in the context of social/environmental circumstances) Etiology: decreased appetite Interventions: Ensure Enlive (each supplement provides 350kcal and 20 grams of protein), MVI   - Patient was instructed, not to drive, operate heavy machinery, perform activities at heights, swimming or participation in water activities or provide baby sitting services while on Pain, Sleep and Anxiety Medications; until her outpatient Physician has advised to do so again.  -  Also recommended to not to take more than prescribed Pain, Sleep and Anxiety Medications.  Patient was seen by physical therapy, who recommended SNF, which was arranged. On the day of the discharge the patient's vitals were stable, and no other acute medical condition  were reported by patient. the patient was felt safe to be discharge at [facility].  Consultants: None Procedures: none  Discharge Exam: General: Appear in no distress, no Rash; Oral Mucosa Clear, moist. Cardiovascular: S1 and S2 Present, no Murmur, Respiratory: normal respiratory effort, Bilateral Air entry present and no Crackles, no wheezes Abdomen: Bowel Sound present, Soft and no tenderness, no hernia Extremities: no Pedal edema, no calf tenderness Neurology: alert and oriented to time, place, and person affect appropriate.  Filed Weights   01/29/21 0523  Weight: 60.3 kg   Vitals:   02/02/21 0000 02/02/21 0442  BP: (!) 141/62 (!) 151/64  Pulse: 65 62  Resp: 19 17  Temp: (!) 97.4 F (36.3 C) 98.6 F (37 C)  SpO2: 96% 95%    DISCHARGE MEDICATION: Allergies as of 02/02/2021       Reactions   Amoxicillin Diarrhea   Erythromycin    Nsaids Other (See Comments)   Other Reaction: GI ulcers        Medication List     TAKE these medications    acetaminophen 500 MG tablet Commonly known as: TYLENOL Take 500-1,000 mg by mouth every 6 (six) hours as needed (pain).   ALPRAZolam 0.25 MG tablet Commonly known as: XANAX Take 0.5 tablets (0.125 mg total) by mouth 2 (two) times daily as needed for anxiety or sleep.   aspirin 81 MG tablet Take 81 mg by mouth daily.   Cholecalciferol 50 MCG (2000 UT) Caps Take 2,000 Units by mouth daily.   CoQ10 50 MG Caps Take 50 mg by mouth daily.   Cosamin DS 500-400 MG tablet Generic drug: glucosamine-chondroitin Take 1 tablet by mouth as directed.   famotidine 10 MG tablet Commonly known as: PEPCID Take 10 mg by mouth daily as needed for heartburn or indigestion.   metoprolol succinate 50 MG 24 hr tablet Commonly known as: TOPROL-XL TAKE 1 TABLET EVERY DAY   PRESERVISION AREDS 2+MULTI VIT PO Take 2 tablets by mouth daily.   simvastatin 10 MG tablet Commonly known as: ZOCOR TAKE 1 TABLET AT BEDTIME   vancomycin 125 MG  capsule Commonly known as: VANCOCIN Take 1 capsule (125 mg total) by mouth 4 (four) times daily for 7 days.       Allergies  Allergen Reactions   Amoxicillin Diarrhea   Erythromycin    Nsaids Other (See Comments)    Other Reaction: GI ulcers   Discharge Instructions     Call MD for:  extreme fatigue   Complete by: As directed    Call MD for:  persistant dizziness or light-headedness   Complete by: As directed    Call MD for:  persistant nausea and vomiting   Complete by: As directed    Call MD for:  severe uncontrolled pain   Complete by: As directed    Call MD for:  temperature >100.4   Complete by: As directed    Diet - low sodium heart healthy   Complete by: As directed    Discharge instructions   Complete by: As directed    Follow with PCP in 1 week, patient should be seen by an MD, monitor blood pressure   Increase activity slowly   Complete by: As directed  The results of significant diagnostics from this hospitalization (including imaging, microbiology, ancillary and laboratory) are listed below for reference.    Significant Diagnostic Studies: CT ABDOMEN PELVIS W CONTRAST  Result Date: 01/29/2021 CLINICAL DATA:  87 year old female with abdominal distension and diarrhea. EXAM: CT ABDOMEN AND PELVIS WITH CONTRAST TECHNIQUE: Multidetector CT imaging of the abdomen and pelvis was performed using the standard protocol following bolus administration of intravenous contrast. CONTRAST:  86m OMNIPAQUE IOHEXOL 350 MG/ML SOLN COMPARISON:  CT Abdomen and Pelvis 12/07/2015. FINDINGS: Lower chest: Cardiomegaly. No pericardial effusion. Calcified coronary artery and aortic atherosclerosis. Mild lung base scarring is stable since 2017. No pleural effusion. Hepatobiliary: Stable liver with multiple scattered small circumscribed cysts and occasional calcified hepatic granulomas. Probable small gallstones on series 2, image 32 but otherwise negative gallbladder. Pancreas:  Negative. Spleen: Negative. Adrenals/Urinary Tract: Adrenal glands are normal. Benign right upper pole renal cyst is stable. Nonobstructed kidneys. Symmetric renal enhancement and contrast excretion. Diminutive and unremarkable bladder. Numerous pelvic phleboliths. No convincing urinary calculus. Stomach/Bowel: Redundant large bowel. Decompressed transverse and rectosigmoid colon. But wall thickening and indistinct appearance of the large bowel from the distal transverse through the descending segments (coronal image 27 and series 2, image 39) with up to mild mesenteric stranding. Proximal transverse colon, hepatic flexure, and right colon have a more normal appearance, although a portion of the redundant ascending colon also appears inflamed on coronal image 43. Questionable mild cecal inflammation. Appendix not identified, could be diminutive or absent. Terminal ileum is decompressed and not definitely inflamed. No dilated small bowel. Decompressed stomach. Duodenum is within normal limits. No free air or free fluid. Vascular/Lymphatic: Extensive Aortoiliac calcified atherosclerosis. Normal caliber abdominal aorta. Major aortic structures remain patent. Portal venous system is patent. No lymphadenopathy. Reproductive: Negative. Other: No pelvic free fluid. Musculoskeletal: Osteopenia. L2 compression fracture with inferior endplate deformity is stable since 2017. Previous proximal right femur ORIF. No acute osseous abnormality identified. IMPRESSION: 1. Evidence of Acute Colitis with intermittent inflamed large bowel from the right colon through the sigmoid, maximal from the distal transverse through the descending segments. Favor Infectious Colitis. No associated bowel obstruction, free fluid, or other complicating features. 2. No other acute or inflammatory process identified in the abdomen or pelvis. Cholelithiasis without evidence of acute cholecystitis. Cardiomegaly. Calcified coronary artery and Aortic  Atherosclerosis (ICD10-I70.0). Chronic lumbar compression fracture. Electronically Signed   By: HGenevie AnnM.D.   On: 01/29/2021 07:45   DG Knee Complete 4 Views Right  Result Date: 01/29/2021 CLINICAL DATA:  87year old female with diarrhea for 2 weeks. Right knee pain. EXAM: RIGHT KNEE - COMPLETE 4+ VIEW COMPARISON:  Right femur series 06/11/2014. FINDINGS: Distal aspect of the chronic right femoral intramedullary rod with distal interlocking cortical screw. Visible hardware appears intact. No joint effusion on the cross-table lateral view. Joint spaces and alignment are normal for age. Osteopenia. No acute osseous abnormality identified. Calcified peripheral vascular disease. IMPRESSION: 1. No acute osseous abnormality identified about the right knee. 2. Stable visible chronic right femoral intramedullary rod. 3. Calcified peripheral vascular disease. Electronically Signed   By: HGenevie AnnM.D.   On: 01/29/2021 05:58    Microbiology: Recent Results (from the past 240 hour(s))  Resp Panel by RT-PCR (Flu A&B, Covid) Nasopharyngeal Swab     Status: None   Collection Time: 01/29/21  5:37 AM   Specimen: Nasopharyngeal Swab; Nasopharyngeal(NP) swabs in vial transport medium  Result Value Ref Range Status   SARS Coronavirus 2 by RT PCR NEGATIVE  NEGATIVE Final    Comment: (NOTE) SARS-CoV-2 target nucleic acids are NOT DETECTED.  The SARS-CoV-2 RNA is generally detectable in upper respiratory specimens during the acute phase of infection. The lowest concentration of SARS-CoV-2 viral copies this assay can detect is 138 copies/mL. A negative result does not preclude SARS-Cov-2 infection and should not be used as the sole basis for treatment or other patient management decisions. A negative result may occur with  improper specimen collection/handling, submission of specimen other than nasopharyngeal swab, presence of viral mutation(s) within the areas targeted by this assay, and inadequate number of  viral copies(<138 copies/mL). A negative result must be combined with clinical observations, patient history, and epidemiological information. The expected result is Negative.  Fact Sheet for Patients:  EntrepreneurPulse.com.au  Fact Sheet for Healthcare Providers:  IncredibleEmployment.be  This test is no t yet approved or cleared by the [place] FDA and  has been authorized for detection and/or diagnosis of SARS-CoV-2 by FDA under an Emergency Use Authorization (EUA). This EUA will remain  in effect (meaning this test can be used) for the duration of the COVID-19 declaration under Section 564(b)(1) of the Act, 21 U.S.C.section 360bbb-3(b)(1), unless the authorization is terminated  or revoked sooner.       Influenza A by PCR NEGATIVE NEGATIVE Final   Influenza B by PCR NEGATIVE NEGATIVE Final    Comment: (NOTE) The Xpert Xpress SARS-CoV-2/FLU/RSV plus assay is intended as an aid in the diagnosis of influenza from Nasopharyngeal swab specimens and should not be used as a sole basis for treatment. Nasal washings and aspirates are unacceptable for Xpert Xpress SARS-CoV-2/FLU/RSV testing.  Fact Sheet for Patients: EntrepreneurPulse.com.au  Fact Sheet for Healthcare Providers: IncredibleEmployment.be  This test is not yet approved or cleared by the [place] FDA and has been authorized for detection and/or diagnosis of SARS-CoV-2 by FDA under an Emergency Use Authorization (EUA). This EUA will remain in effect (meaning this test can be used) for the duration of the COVID-19 declaration under Section 564(b)(1) of the Act, 21 U.S.C. section 360bbb-3(b)(1), unless the authorization is terminated or revoked.  Performed at [facility], [address] [place], [place] [address]   Gastrointestinal Panel by PCR , Stool     Status: None   Collection Time: 01/30/21 12:07 AM   Specimen: Stool   Result Value Ref Range Status   Campylobacter species NOT DETECTED NOT DETECTED Final   Plesimonas shigelloides NOT DETECTED NOT DETECTED Final   Salmonella species NOT DETECTED NOT DETECTED Final   Yersinia enterocolitica NOT DETECTED NOT DETECTED Final   Vibrio species NOT DETECTED NOT DETECTED Final   Vibrio cholerae NOT DETECTED NOT DETECTED Final   Enteroaggregative E coli (EAEC) NOT DETECTED NOT DETECTED Final   Enteropathogenic E coli (EPEC) NOT DETECTED NOT DETECTED Final   Enterotoxigenic E coli (ETEC) NOT DETECTED NOT DETECTED Final   Shiga like toxin producing E coli (STEC) NOT DETECTED NOT DETECTED Final   Shigella/Enteroinvasive E coli (EIEC) NOT DETECTED NOT DETECTED Final   Cryptosporidium NOT DETECTED NOT DETECTED Final   Cyclospora cayetanensis NOT DETECTED NOT DETECTED Final   Entamoeba histolytica NOT DETECTED NOT DETECTED Final   Giardia lamblia NOT DETECTED NOT DETECTED Final   Adenovirus F40/41 NOT DETECTED NOT DETECTED Final   Astrovirus NOT DETECTED NOT DETECTED Final   Norovirus GI/GII NOT DETECTED NOT DETECTED Final   Rotavirus A NOT DETECTED NOT DETECTED Final   Sapovirus (I, II, IV, and V) NOT DETECTED NOT DETECTED  Final    Comment: Performed at [facility] [place], [place] [address]  C Difficile Quick Screen w PCR reflex     Status: Abnormal   Collection Time: 01/30/21 12:07 AM   Specimen: STOOL  Result Value Ref Range Status   C Diff antigen POSITIVE (A) NEGATIVE Final   C Diff toxin POSITIVE (A) NEGATIVE Final   C Diff interpretation Toxin producing C. difficile detected.  Final    Comment: CRITICAL RESULT CALLED TO, READ BACK BY AND VERIFIED WITH: Georga Hacking AT 0208 ON 01/30/21 BY SS Performed at [facility], [address] [place], [place] [address]   Resp Panel by RT-PCR (Flu A&B, Covid) Nasopharyngeal Swab     Status: None   Collection Time: 02/02/21  9:17 AM   Specimen: Nasopharyngeal Swab;  Nasopharyngeal(NP) swabs in vial transport medium  Result Value Ref Range Status   SARS Coronavirus 2 by RT PCR NEGATIVE NEGATIVE Final    Comment: (NOTE) SARS-CoV-2 target nucleic acids are NOT DETECTED.  The SARS-CoV-2 RNA is generally detectable in upper respiratory specimens during the acute phase of infection. The lowest concentration of SARS-CoV-2 viral copies this assay can detect is 138 copies/mL. A negative result does not preclude SARS-Cov-2 infection and should not be used as the sole basis for treatment or other patient management decisions. A negative result may occur with  improper specimen collection/handling, submission of specimen other than nasopharyngeal swab, presence of viral mutation(s) within the areas targeted by this assay, and inadequate number of viral copies(<138 copies/mL). A negative result must be combined with clinical observations, patient history, and epidemiological information. The expected result is Negative.  Fact Sheet for Patients:  EntrepreneurPulse.com.au  Fact Sheet for Healthcare Providers:  IncredibleEmployment.be  This test is no t yet approved or cleared by the [place] FDA and  has been authorized for detection and/or diagnosis of SARS-CoV-2 by FDA under an Emergency Use Authorization (EUA). This EUA will remain  in effect (meaning this test can be used) for the duration of the COVID-19 declaration under Section 564(b)(1) of the Act, 21 U.S.C.section 360bbb-3(b)(1), unless the authorization is terminated  or revoked sooner.       Influenza A by PCR NEGATIVE NEGATIVE Final   Influenza B by PCR NEGATIVE NEGATIVE Final    Comment: (NOTE) The Xpert Xpress SARS-CoV-2/FLU/RSV plus assay is intended as an aid in the diagnosis of influenza from Nasopharyngeal swab specimens and should not be used as a sole basis for treatment. Nasal washings and aspirates are unacceptable for Xpert Xpress  SARS-CoV-2/FLU/RSV testing.  Fact Sheet for Patients: EntrepreneurPulse.com.au  Fact Sheet for Healthcare Providers: IncredibleEmployment.be  This test is not yet approved or cleared by the [place] FDA and has been authorized for detection and/or diagnosis of SARS-CoV-2 by FDA under an Emergency Use Authorization (EUA). This EUA will remain in effect (meaning this test can be used) for the duration of the COVID-19 declaration under Section 564(b)(1) of the Act, 21 U.S.C. section 360bbb-3(b)(1), unless the authorization is terminated or revoked.  Performed at [facility], [address] [place], [place] [address]      Labs: CBC: Recent Labs  Lab 01/29/21 0537 02/02/21 0557  WBC 8.7 5.7  NEUTROABS 5.2  --   HGB 10.4* 9.9*  HCT 30.2* 28.6*  MCV 96.5 95.7  PLT 101* 123456*   Basic Metabolic Panel: Recent Labs  Lab 01/29/21 0537 01/29/21 1441 01/30/21 0700 02/02/21 0557  NA 139  --  138 138  K 4.0  --  3.9 4.4  CL 103  --  108 104  CO2 24  --  25 27  GLUCOSE 109*  --  95 96  BUN 33*  --  17 24*  CREATININE 0.88  --  0.73 0.82  CALCIUM 9.4  --  8.6* 9.0  MG  --  2.0  --  2.1  PHOS  --   --   --  3.7   Liver Function Tests: Recent Labs  Lab 01/29/21 0537  AST 18  ALT 12  ALKPHOS 68  BILITOT 0.9  PROT 7.5  ALBUMIN 4.2   Recent Labs  Lab 01/29/21 0537  LIPASE 34   No results for input(s): AMMONIA in the last 168 hours. Cardiac Enzymes: No results for input(s): CKTOTAL, CKMB, CKMBINDEX, TROPONINI in the last 168 hours. BNP (last 3 results) No results for input(s): BNP in the last 8760 hours. CBG: No results for input(s): GLUCAP in the last 168 hours.  Time spent: 35 minutes  Signed:  Val Riles  [facility]  02/02/2021 12:04 PM

## 2021-02-02 NOTE — Care Management Important Message (Signed)
Important Message  Patient Details  Name: Alicia Moran MRN: [id number] Date of Birth: 1928-10-15   Medicare Important Message Given:  Yes  Patient is in an isolation room so I talked with her by phone [phone]) and she is in agreement with the discharge plan.  Stated she is ready to make the transition to SNF. I asked if she would like a copy and she declined.  I wished her well and thanked her for her time.  Alicia Moran 02/02/2021, 1:58 PM

## 2021-02-02 NOTE — Progress Notes (Signed)
Patient discharged to [facility] via EMS transport in stable condition. Patient belongings and discharge packet sent with EMS. Report given to Charlie at [facility]. Patient to notify niece per patient request.

## 2021-05-18 ENCOUNTER — Other Ambulatory Visit: Payer: Self-pay | Admitting: *Deleted

## 2021-05-18 DIAGNOSIS — D509 Iron deficiency anemia, unspecified: Secondary | ICD-10-CM

## 2021-05-18 DIAGNOSIS — D472 Monoclonal gammopathy: Secondary | ICD-10-CM

## 2021-05-18 DIAGNOSIS — D696 Thrombocytopenia, unspecified: Secondary | ICD-10-CM

## 2021-05-19 NOTE — Progress Notes (Signed)
[facility]  [phone] [phone]  ID: Alicia Moran OB: 10/27/1928  MR#: [id number]  XBM#:[id number]  [name], [name] (Internal Medicine) [name], [name] (Cardiology)  CHIEF COMPLAINT: Thrombocytopenia, iron deficiency anemia, MGUS.  INTERVAL HISTORY: Patient last evaluated in clinic in March of 2021. She is referred back by Neurology for further evaluation and discussion of laboratory results. She has chronic weakness and fatigue, but otherwise feels well.  She has no neurologic complaints. She denies any recent fevers or illnesses. She has a good appetite and denies weight loss. She has no chest pain,cough, hemoptysis, or shortness of breath. She denies any nausea, vomiting, constipation, or diarrhea. She has no melena or hematochezia. She has no urinary complaints.  Patient offer no further specific complaints today.  REVIEW OF SYSTEMS:   Review of Systems  Constitutional:  Positive for malaise/fatigue. Negative for fever and weight loss.  Respiratory: Negative.  Negative for cough and shortness of breath.   Cardiovascular: Negative.  Negative for chest pain and leg swelling.  Gastrointestinal: Negative.  Negative for abdominal pain, blood in stool and melena.  Genitourinary: Negative.  Negative for hematuria.  Musculoskeletal: Negative.  Negative for back pain.  Skin: Negative.  Negative for rash.  Neurological:  Positive for weakness. Negative for focal weakness and headaches.  Endo/Heme/Allergies: Negative.  Does not bruise/bleed easily.  Psychiatric/Behavioral: Negative.  The patient is not nervous/anxious and does not have insomnia.    As per HPI. Otherwise, a complete review of systems is negative.  PAST MEDICAL HISTORY: Past Medical History:  Diagnosis Date   Atrial fibrillation (HCC)    Hyperlipidemia    Migraine headache    Stroke (HCC)     PAST SURGICAL  HISTORY: No past surgical history on file.  FAMILY HISTORY: Family History  Problem Relation Age of Onset   Hypertension Mother    Diabetes Mother    Hypertension Father     ADVANCED DIRECTIVES (Y/N):  N  HEALTH MAINTENANCE: Social History   Tobacco Use   Smoking status: Former    Types: Cigarettes    Quit date: 03/29/1986    Years since quitting: 35.1   Smokeless tobacco: Never  Substance Use Topics   Alcohol use: No   Drug use: No     Colonoscopy:  PAP:  Bone density:  Lipid panel:  Allergies  Allergen Reactions   Amoxicillin Diarrhea   Erythromycin    Nsaids Other (See Comments)    Other Reaction: GI ulcers    Current Outpatient Medications  Medication Sig Dispense Refill   acetaminophen (TYLENOL) 500 MG tablet Take 500-1,000 mg by mouth every 6 (six) hours as needed (pain).     ALPRAZolam (XANAX) 0.25 MG tablet Take 0.5 tablets (0.125 mg total) by mouth 2 (two) times daily as needed for anxiety or sleep. 10 tablet 0   aspirin 81 MG tablet Take 81 mg by mouth daily.     Cholecalciferol 50 MCG (2000 UT) CAPS Take 2,000 Units by mouth daily.     Coenzyme Q10 (COQ10) 50 MG CAPS Take 50 mg by mouth daily.     famotidine (PEPCID) 10 MG tablet Take 10 mg by mouth daily as needed for heartburn or indigestion.     glucosamine-chondroitin 500-400 MG tablet Take 1 tablet by mouth as directed.     metoprolol succinate (TOPROL-XL) 50 MG 24 hr tablet TAKE 1 TABLET EVERY DAY 90 tablet  2   Multiple Vitamins-Minerals (PRESERVISION AREDS 2+MULTI VIT PO) Take 2 tablets by mouth daily.     simvastatin (ZOCOR) 10 MG tablet TAKE 1 TABLET AT BEDTIME 90 tablet 2   hydrOXYzine (ATARAX) 10 MG tablet Take 10 mg by mouth 2 (two) times daily as needed. (Patient not taking: Reported on 05/23/2021)     loperamide (IMODIUM) 2 MG capsule loperamide 2 mg capsule (Patient not taking: Reported on 05/23/2021)     ondansetron (ZOFRAN) 4 MG tablet ondansetron HCl 4 mg tablet (Patient not taking:  Reported on 05/23/2021)     No current facility-administered medications for this visit.    OBJECTIVE: Vitals:   05/23/21 1057  BP: 116/75  Pulse: 78  Resp: 18  SpO2: 98%     There is no height or weight on file to calculate BMI.    ECOG FS:0 - Asymptomatic  General: Well-developed, well-nourished, no acute distress. Sitting in a wheelchair. Eyes: Pink conjunctiva, anicteric sclera. HEENT: Normocephalic, moist mucous membranes. Lungs: No audible wheezing or coughing. Heart: Regular rate and rhythm. Abdomen: Soft, nontender, no obvious distention. Musculoskeletal: No edema, cyanosis, or clubbing. Neuro: Alert, answering all questions appropriately. Cranial nerves grossly intact. Skin: No rashes or petechiae noted. Psych: Normal affect.   LAB RESULTS:  Lab Results  Component Value Date   NA 135 05/23/2021   K 3.9 05/23/2021   CL 100 05/23/2021   CO2 24 05/23/2021   GLUCOSE 100 (H) 05/23/2021   BUN 38 (H) 05/23/2021   CREATININE 0.77 05/23/2021   CALCIUM 9.4 05/23/2021   PROT 7.5 01/29/2021   ALBUMIN 4.2 01/29/2021   AST 18 01/29/2021   ALT 12 01/29/2021   ALKPHOS 68 01/29/2021   BILITOT 0.9 01/29/2021   GFRNONAA >60 05/23/2021   GFRAA >60 08/31/2019    Lab Results  Component Value Date   WBC 5.6 05/23/2021   NEUTROABS 2.0 05/23/2021   HGB 9.6 (L) 05/23/2021   HCT 27.9 (L) 05/23/2021   MCV 96.5 05/23/2021   PLT 83 (L) 05/23/2021   Lab Results  Component Value Date   IRON 53 05/23/2021   TIBC 321 05/23/2021   IRONPCTSAT 17 05/23/2021   Lab Results  Component Value Date   FERRITIN 46 05/23/2021   Lab Results  Component Value Date   TOTALPROTELP 6.4 08/31/2019   ALBUMINELP 3.7 08/31/2019   A1GS 0.2 08/31/2019   A2GS 0.6 08/31/2019   BETS 0.7 08/31/2019   GAMS 1.2 08/31/2019   MSPIKE 0.9 (H) 08/31/2019   SPEI Comment 08/31/2019   Lab Results  Component Value Date   CEA 1.9 05/30/2016     STUDIES: No results found.  ASSESSMENT:  Thrombocytopenia, Iron deficiency anemia, MGUS  PLAN:    1. MGUS: Patient's most recent M-spike on May 04, 2021 was reported at 1.3 which is unchanged from 2 years prior. Her kappa-free light chains have increase to 294.8. She continues to have a mild anemia and thrombocytopenia, but these are also essentially unchanged.  If she in fact progressed to multiple myeloma, given her decreased performance status and age treatment will be difficult.  She does not require a bone marrow biopsy.  She does not require routine monitoring of SPEP. No further follow up has been scheduled. 2. Thrombocytopenia: Platelets have decreased slightly from her baseline to 83. No intervention needed. Continue monitoring by primary care.  No follow-up as above. 3. Anemia: Chronic and unchanged.  Her most recent hemoglobin is 9.6. Previously the remainder of her laboratory work  including iron stores was either negative or within normal limits.  Follow-up with primary care as above.    I spent a total of 30 minutes reviewing chart data, face-to-face evaluation with the patient, counseling and coordination of care as detailed above.   Patient expressed understanding and was in agreement with this plan. She also understands that She can call clinic at any time with any questions, concerns, or complaints.    [name], MD   05/23/2021 6:03 PM

## 2021-05-23 ENCOUNTER — Inpatient Hospital Stay (HOSPITAL_BASED_OUTPATIENT_CLINIC_OR_DEPARTMENT_OTHER): Payer: Medicare Other | Admitting: Oncology

## 2021-05-23 ENCOUNTER — Inpatient Hospital Stay: Payer: Medicare Other | Attending: Oncology

## 2021-05-23 ENCOUNTER — Other Ambulatory Visit: Payer: Self-pay

## 2021-05-23 VITALS — BP 116/75 | HR 78 | Resp 18

## 2021-05-23 DIAGNOSIS — D472 Monoclonal gammopathy: Secondary | ICD-10-CM | POA: Insufficient documentation

## 2021-05-23 DIAGNOSIS — Z87891 Personal history of nicotine dependence: Secondary | ICD-10-CM | POA: Insufficient documentation

## 2021-05-23 DIAGNOSIS — D696 Thrombocytopenia, unspecified: Secondary | ICD-10-CM | POA: Insufficient documentation

## 2021-05-23 DIAGNOSIS — D509 Iron deficiency anemia, unspecified: Secondary | ICD-10-CM | POA: Insufficient documentation

## 2021-05-23 LAB — CBC WITH DIFFERENTIAL/PLATELET
Abs Immature Granulocytes: 0.02 10*3/uL (ref 0.00–0.07)
Basophils Absolute: 0 10*3/uL (ref 0.0–0.1)
Basophils Relative: 1 %
Eosinophils Absolute: 0 10*3/uL (ref 0.0–0.5)
Eosinophils Relative: 1 %
HCT: 27.9 % — ABNORMAL LOW (ref 36.0–46.0)
Hemoglobin: 9.6 g/dL — ABNORMAL LOW (ref 12.0–15.0)
Immature Granulocytes: 0 %
Lymphocytes Relative: 55 %
Lymphs Abs: 3.2 10*3/uL (ref 0.7–4.0)
MCH: 33.2 pg (ref 26.0–34.0)
MCHC: 34.4 g/dL (ref 30.0–36.0)
MCV: 96.5 fL (ref 80.0–100.0)
Monocytes Absolute: 0.3 10*3/uL (ref 0.1–1.0)
Monocytes Relative: 6 %
Neutro Abs: 2 10*3/uL (ref 1.7–7.7)
Neutrophils Relative %: 37 %
Platelets: 83 10*3/uL — ABNORMAL LOW (ref 150–400)
RBC: 2.89 MIL/uL — ABNORMAL LOW (ref 3.87–5.11)
RDW: 13.1 % (ref 11.5–15.5)
WBC: 5.6 10*3/uL (ref 4.0–10.5)
nRBC: 0 % (ref 0.0–0.2)

## 2021-05-23 LAB — IRON AND TIBC
Iron: 53 ug/dL (ref 28–170)
Saturation Ratios: 17 % (ref 10.4–31.8)
TIBC: 321 ug/dL (ref 250–450)
UIBC: 268 ug/dL

## 2021-05-23 LAB — BASIC METABOLIC PANEL
Anion gap: 11 (ref 5–15)
BUN: 38 mg/dL — ABNORMAL HIGH (ref 8–23)
CO2: 24 mmol/L (ref 22–32)
Calcium: 9.4 mg/dL (ref 8.9–10.3)
Chloride: 100 mmol/L (ref 98–111)
Creatinine, Ser: 0.77 mg/dL (ref 0.44–1.00)
GFR, Estimated: >60 mL/min (ref >60)
Glucose, Bld: 100 mg/dL — ABNORMAL HIGH (ref 70–99)
Potassium: 3.9 mmol/L (ref 3.5–5.1)
Sodium: 135 mmol/L (ref 135–145)

## 2021-05-23 LAB — FERRITIN: Ferritin: 46 ng/mL (ref 11–307)

## 2021-05-24 LAB — IGG, IGA, IGM
IgA: 45 mg/dL — ABNORMAL LOW (ref 64–422)
IgG (Immunoglobin G), Serum: 445 mg/dL — ABNORMAL LOW (ref 586–1602)
IgM (Immunoglobulin M), Srm: 1772 mg/dL — ABNORMAL HIGH (ref 26–217)

## 2021-05-24 LAB — KAPPA/LAMBDA LIGHT CHAINS
Kappa free light chain: 246.5 mg/L — ABNORMAL HIGH (ref 3.3–19.4)
Kappa, lambda light chain ratio: 30.81 — ABNORMAL HIGH (ref 0.26–1.65)
Lambda free light chains: 8 mg/L (ref 5.7–26.3)

## 2021-05-25 LAB — PROTEIN ELECTROPHORESIS, SERUM
A/G Ratio: 1.2 (ref 0.7–1.7)
Albumin ELP: 3.7 g/dL (ref 2.9–4.4)
Alpha-1-Globulin: 0.2 g/dL (ref 0.0–0.4)
Alpha-2-Globulin: 0.6 g/dL (ref 0.4–1.0)
Beta Globulin: 0.7 g/dL (ref 0.7–1.3)
Gamma Globulin: 1.7 g/dL (ref 0.4–1.8)
Globulin, Total: 3.2 g/dL (ref 2.2–3.9)
M-Spike, %: 1.4 g/dL — ABNORMAL HIGH
Total Protein ELP: 6.9 g/dL (ref 6.0–8.5)

## 2021-07-12 ENCOUNTER — Telehealth: Payer: Self-pay | Admitting: Cardiovascular Disease

## 2021-07-12 DIAGNOSIS — I38 Endocarditis, valve unspecified: Secondary | ICD-10-CM

## 2021-07-12 NOTE — Telephone Encounter (Signed)
Patient calling to see if she is due for echo, please advise

## 2021-07-12 NOTE — Telephone Encounter (Signed)
Per the patients 10/13/20 appt with Dr. Fletcher Anon  Moderate valvular heart disease: Echo last year showed relatively stable findings with moderate mitral regurgitation, mild aortic stenosis and mild tricuspid regurgitation with minimal pulmonary hypertension.  The plan is to repeat echocardiogram next year.    Patient is to f/u with an appt in April 2023. Order placed for echo. Echo should be done some time prior to the patients appt.

## 2021-09-01 ENCOUNTER — Other Ambulatory Visit: Payer: Self-pay | Admitting: Cardiovascular Disease

## 2021-10-09 ENCOUNTER — Other Ambulatory Visit: Payer: Medicare Other

## 2021-10-12 ENCOUNTER — Ambulatory Visit: Payer: Medicare Other | Admitting: Cardiovascular Disease

## 2021-10-13 ENCOUNTER — Encounter: Payer: Self-pay | Admitting: Cardiovascular Disease

## 2021-10-13 ENCOUNTER — Ambulatory Visit (INDEPENDENT_AMBULATORY_CARE_PROVIDER_SITE_OTHER): Payer: Medicare Other | Admitting: Cardiovascular Disease

## 2021-10-13 ENCOUNTER — Ambulatory Visit (INDEPENDENT_AMBULATORY_CARE_PROVIDER_SITE_OTHER): Payer: Medicare Other

## 2021-10-13 VITALS — BP 110/78 | HR 67 | Ht 62.0 in | Wt 128.4 lb

## 2021-10-13 DIAGNOSIS — I38 Endocarditis, valve unspecified: Secondary | ICD-10-CM

## 2021-10-13 DIAGNOSIS — I10 Essential (primary) hypertension: Secondary | ICD-10-CM

## 2021-10-13 DIAGNOSIS — R600 Localized edema: Secondary | ICD-10-CM

## 2021-10-13 DIAGNOSIS — Z8673 Personal history of transient ischemic attack (TIA), and cerebral infarction without residual deficits: Secondary | ICD-10-CM

## 2021-10-13 DIAGNOSIS — I359 Nonrheumatic aortic valve disorder, unspecified: Secondary | ICD-10-CM

## 2021-10-13 LAB — ECHOCARDIOGRAM COMPLETE
AR max vel: 1.52 cm2
AV Area VTI: 1.51 cm2
AV Area mean vel: 1.35 cm2
AV Mean grad: 14.4 mmHg
AV Peak grad: 23.4 mmHg
AV Vena cont: 0.6 cm
Ao pk vel: 2.42 m/s
Area-P 1/2: 3.83 cm2
Calc EF: 75.8 %
Height: 62 in
MV M vel: 6.22 m/s
MV Peak grad: 154.8 mmHg
MV VTI: 3.54 cm2
P 1/2 time: 584 msec
S' Lateral: 3.1 cm
Single Plane A2C EF: 69.7 %
Single Plane A4C EF: 77.2 %
Weight: 2054 oz

## 2021-10-13 NOTE — Progress Notes (Signed)
?  ?Cardiology Office Note ? ? ?Date:  10/13/2021  ? ?ID:  Alicia Moran, DOB Jun 09, 1929, MRN [id number] ? ?PCP:  [name], MD  ?Cardiologist:   Lorine Bears, MD  ? ?Chief Complaint  ?Patient presents with  ? Other  ?  6 month f/u. Meds reviewed verbally with pt.  ? ? ?  ?History of Present Illness: ?Alicia Moran is a 88 y.o. female who Is here today for a follow-up visit regarding mild to moderate valvular disease. ?Paroxysmal atrial fibrillation is mentioned in her chart. However, after extensive review of her chart from [facility], I did not find conclusive evidence of atrial fibrillation. It appears that in 2008 she suffered from a stroke in spite of being on Plavix. Thus, she was started on anticoagulation with warfarin at that time for that indication.  She suffered from subdural hematoma in 2015 after a fall and thus warfarin was discontinued at that time and was not resumed.  she had previous cardiac catheterization at [facility] in 2006 with no obstructive coronary artery disease.  ?  ?Her daughter died in 2019/01/18and since then the patient had significant depression and grief. ? ?Most recent echocardiogram in March 2021 showed normal LV systolic function, mild aortic stenosis with mild regurgitation, moderate mitral regurgitation and mild tricuspid regurgitation.  There was mild pulmonary hypertension.   ?She lives by herself but she does have an aide. ? ?She has been doing reasonably well with no recent chest pain or worsening dyspnea.  No palpitations.  She walks slowly with a walker.  She does not drive. ? ? ? ? ?Past Medical History:  ?Diagnosis Date  ? Hyperlipidemia   ? Migraine headache   ? Stroke [facility])   ? ? ?No past surgical history on file. ? ? ?Current Outpatient Medications  ?Medication Sig Dispense Refill  ? acetaminophen (TYLENOL) 500 MG tablet Take 500-1,000 mg by mouth every 6 (six) hours as needed (pain).    ? ALPRAZolam (XANAX) 0.25 MG tablet Take 0.5 tablets (0.125 mg total) by  mouth 2 (two) times daily as needed for anxiety or sleep. 10 tablet 0  ? aspirin 81 MG tablet Take 81 mg by mouth daily.    ? Cholecalciferol 50 MCG (2000 UT) CAPS Take 2,000 Units by mouth daily.    ? Coenzyme Q10 (COQ10) 50 MG CAPS Take 50 mg by mouth daily.    ? famotidine (PEPCID) 10 MG tablet Take 10 mg by mouth daily as needed for heartburn or indigestion.    ? glucosamine-chondroitin 500-400 MG tablet Take 1 tablet by mouth as directed.    ? hydrOXYzine (ATARAX) 10 MG tablet Take 10 mg by mouth 2 (two) times daily as needed.    ? loperamide (IMODIUM) 2 MG capsule     ? methylPREDNISolone (MEDROL DOSEPAK) 4 MG TBPK tablet as directed.    ? metoprolol succinate (TOPROL-XL) 50 MG 24 hr tablet TAKE 1 TABLET EVERY DAY 90 tablet 0  ? Multiple Vitamins-Minerals (PRESERVISION AREDS 2+MULTI VIT PO) Take 2 tablets by mouth daily.    ? ondansetron (ZOFRAN) 4 MG tablet     ? simvastatin (ZOCOR) 10 MG tablet TAKE 1 TABLET AT BEDTIME 90 tablet 2  ? ?No current facility-administered medications for this visit.  ? ? ?Allergies:   Amoxicillin, Erythromycin, and Nsaids  ? ? ?Social History:  The patient  reports that she quit smoking about 35 years ago. She has never used smokeless tobacco. She reports that she  does not drink alcohol and does not use drugs.  ? ?Family History:  The patient's family history includes Diabetes in her mother; Hypertension in her father and mother.  ? ? ?ROS:  Please see the history of present illness.   Otherwise, review of systems are positive for none.   All other systems are reviewed and negative.  ? ? ?PHYSICAL EXAM: ?VS:  BP 110/78 (BP Location: Right Arm, Patient Position: Sitting, Cuff Size: Normal)   Pulse 67   Ht 5\' 2"  (1.575 m)   Wt 128 lb 6 oz (58.2 kg)   SpO2 98%   BMI 23.48 kg/m?  , BMI Body mass index is 23.48 kg/m?. ?GEN: Well nourished, well developed, in no acute distress  ?HEENT: normal  ?Neck: no JVD, carotid bruits, or masses ?Cardiac: RRR; no rubs, or gallops . 2/6  systolic murmur in the aortic area. 2/6 holosystolic murmur at the apex.  Mild bilateral leg edema worse on the left side ?Respiratory:  clear to auscultation bilaterally, normal work of breathing ?GI: soft, nontender, nondistended, + BS ?MS: no deformity or atrophy  ?Skin: warm and dry, no rash ?Neuro:  Strength and sensation are intact ?Psych: euthymic mood, full affect ? ? ?EKG:  EKG is ordered today. ?The ekg ordered today demonstrates normal sinus rhythm with LVH.  He has ? ? ?Recent Labs: ?01/29/2021: ALT 12 ?02/02/2021: Magnesium 2.1 ?05/23/2021: BUN 38; Creatinine, Ser 0.77; Hemoglobin 9.6; Platelets 83; Potassium 3.9; Sodium 135  ? ? ?Lipid Panel ?   ?Component Value Date/Time  ? CHOL 121 08/26/2015 0941  ? TRIG 48.0 08/26/2015 0941  ? HDL 52.00 08/26/2015 0941  ? CHOLHDL 2 08/26/2015 0941  ? VLDL 9.6 08/26/2015 0941  ? LDLCALC 60 08/26/2015 0941  ? ?  ? ?Wt Readings from Last 3 Encounters:  ?10/13/21 128 lb 6 oz (58.2 kg)  ?01/29/21 132 lb 15 oz (60.3 kg)  ?10/13/20 133 lb (60.3 kg)  ?  ? ? ? ? ?ASSESSMENT AND PLAN: ? ?1.  Moderate valvular heart disease: She has known moderate mitral regurgitation mild aortic stenosis.  She is scheduled for a repeat echocardiogram today but I do not think she is going to be a good candidate for any intervention regarding her valvular disease.  No plans for repeat echocardiogram after today.   ?  ?2. Previous history of stroke: No documented history of atrial fibrillation. Continue low-dose aspirin.  ?  ?3. Essential hypertension: Blood pressure is reasonably controlled on Toprol.  ? ?4.  Grief and depression: She did not respond to multiple medications.  This is being managed by Dr. Arlana Pouch. ? ?5.  Bilateral leg edema: Likely due to chronic venous insufficiency.  ? ? ? ?Disposition:   FU with me in 12 months. ? ? ?Signed, ? ?Lorine Bears, MD  ?10/13/2021 10:25 AM    ?[facility] ?

## 2021-10-13 NOTE — Patient Instructions (Signed)
Medication Instructions:  ?Your physician recommends that you continue on your current medications as directed. Please refer to the Current Medication list given to you today. ? ?*If you need a refill on your cardiac medications before your next appointment, please call your pharmacy* ? ? ?Lab Work: ?None ordered ?If you have labs (blood work) drawn today and your tests are completely normal, you will receive your results only by: ?MyChart Message (if you have MyChart) OR ?A paper copy in the mail ?If you have any lab test that is abnormal or we need to change your treatment, we will call you to review the results. ? ? ?Testing/Procedures: ?Echo today ? ? ?Follow-Up: ?At [facility], you and your health needs are our priority.  As part of our continuing mission to provide you with exceptional heart care, we have created designated Provider Care Teams.  These Care Teams include your primary Cardiologist (physician) and Advanced Practice Providers (APPs -  Physician Assistants and Nurse Practitioners) who all work together to provide you with the care you need, when you need it. ? ?We recommend signing up for the patient portal called "MyChart".  Sign up information is provided on this After Visit Summary.  MyChart is used to connect with patients for Virtual Visits (Telemedicine).  Patients are able to view lab/test results, encounter notes, upcoming appointments, etc.  Non-urgent messages can be sent to your provider as well.   ?To learn more about what you can do with MyChart, go to [email or web address].   ? ?Your next appointment:   ?Your physician wants you to follow-up in: 1 year You will receive a reminder letter in the mail two months in advance. If you don't receive a letter, please call our office to schedule the follow-up appointment. ? ? ?The format for your next appointment:   ?In Person ? ?Provider:   ?You may see Kathlyn Sacramento, MD or one of the following Advanced Practice Providers on your  designated Care Team:   ?Murray Hodgkins, NP ?Christell Faith, PA-C ?Cadence Kathlen Mody, PA-C{ ? ? ? ?Other Instructions ?N/A ? ?Important Information About Sugar ? ? ? ? ? ? ?

## 2021-12-02 ENCOUNTER — Other Ambulatory Visit: Payer: Self-pay | Admitting: Cardiovascular Disease

## 2021-12-21 ENCOUNTER — Emergency Department: Payer: Medicare Other

## 2021-12-21 ENCOUNTER — Encounter: Payer: Self-pay | Admitting: Emergency Medicine

## 2021-12-21 ENCOUNTER — Inpatient Hospital Stay
Admission: EM | Admit: 2021-12-21 | Discharge: 2021-12-26 | DRG: 554 | Disposition: A | Discharge status: Skilled Nursing Facility | Payer: Medicare Other | Attending: Internal Medicine | Admitting: Internal Medicine

## 2021-12-21 ENCOUNTER — Other Ambulatory Visit: Payer: Self-pay

## 2021-12-21 DIAGNOSIS — E785 Hyperlipidemia, unspecified: Secondary | ICD-10-CM | POA: Diagnosis present

## 2021-12-21 DIAGNOSIS — M5416 Radiculopathy, lumbar region: Secondary | ICD-10-CM | POA: Diagnosis present

## 2021-12-21 DIAGNOSIS — H409 Unspecified glaucoma: Secondary | ICD-10-CM | POA: Diagnosis present

## 2021-12-21 DIAGNOSIS — Z8249 Family history of ischemic heart disease and other diseases of the circulatory system: Secondary | ICD-10-CM

## 2021-12-21 DIAGNOSIS — Z886 Allergy status to analgesic agent status: Secondary | ICD-10-CM

## 2021-12-21 DIAGNOSIS — I959 Hypotension, unspecified: Secondary | ICD-10-CM | POA: Diagnosis not present

## 2021-12-21 DIAGNOSIS — Z881 Allergy status to other antibiotic agents status: Secondary | ICD-10-CM

## 2021-12-21 DIAGNOSIS — Z833 Family history of diabetes mellitus: Secondary | ICD-10-CM

## 2021-12-21 DIAGNOSIS — I340 Nonrheumatic mitral (valve) insufficiency: Secondary | ICD-10-CM

## 2021-12-21 DIAGNOSIS — I10 Essential (primary) hypertension: Secondary | ICD-10-CM | POA: Diagnosis present

## 2021-12-21 DIAGNOSIS — M1712 Unilateral primary osteoarthritis, left knee: Secondary | ICD-10-CM | POA: Diagnosis not present

## 2021-12-21 DIAGNOSIS — Z79899 Other long term (current) drug therapy: Secondary | ICD-10-CM

## 2021-12-21 DIAGNOSIS — R627 Adult failure to thrive: Secondary | ICD-10-CM | POA: Diagnosis present

## 2021-12-21 DIAGNOSIS — Z8673 Personal history of transient ischemic attack (TIA), and cerebral infarction without residual deficits: Secondary | ICD-10-CM

## 2021-12-21 DIAGNOSIS — R531 Weakness: Secondary | ICD-10-CM | POA: Diagnosis not present

## 2021-12-21 DIAGNOSIS — Z7982 Long term (current) use of aspirin: Secondary | ICD-10-CM

## 2021-12-21 DIAGNOSIS — D649 Anemia, unspecified: Secondary | ICD-10-CM

## 2021-12-21 DIAGNOSIS — I083 Combined rheumatic disorders of mitral, aortic and tricuspid valves: Secondary | ICD-10-CM | POA: Diagnosis present

## 2021-12-21 DIAGNOSIS — M1990 Unspecified osteoarthritis, unspecified site: Secondary | ICD-10-CM | POA: Diagnosis not present

## 2021-12-21 DIAGNOSIS — H5461 Unqualified visual loss, right eye, normal vision left eye: Secondary | ICD-10-CM | POA: Diagnosis present

## 2021-12-21 DIAGNOSIS — I110 Hypertensive heart disease with heart failure: Secondary | ICD-10-CM | POA: Diagnosis present

## 2021-12-21 DIAGNOSIS — D500 Iron deficiency anemia secondary to blood loss (chronic): Secondary | ICD-10-CM | POA: Diagnosis present

## 2021-12-21 DIAGNOSIS — D696 Thrombocytopenia, unspecified: Secondary | ICD-10-CM | POA: Diagnosis present

## 2021-12-21 DIAGNOSIS — H3530 Unspecified macular degeneration: Secondary | ICD-10-CM | POA: Diagnosis present

## 2021-12-21 DIAGNOSIS — Z87891 Personal history of nicotine dependence: Secondary | ICD-10-CM

## 2021-12-21 DIAGNOSIS — I5032 Chronic diastolic (congestive) heart failure: Secondary | ICD-10-CM | POA: Diagnosis present

## 2021-12-21 DIAGNOSIS — I350 Nonrheumatic aortic (valve) stenosis: Secondary | ICD-10-CM

## 2021-12-21 DIAGNOSIS — G2581 Restless legs syndrome: Secondary | ICD-10-CM | POA: Diagnosis present

## 2021-12-21 DIAGNOSIS — Z6823 Body mass index (BMI) 23.0-23.9, adult: Secondary | ICD-10-CM

## 2021-12-21 DIAGNOSIS — Z638 Other specified problems related to primary support group: Secondary | ICD-10-CM

## 2021-12-21 DIAGNOSIS — I480 Paroxysmal atrial fibrillation: Secondary | ICD-10-CM | POA: Diagnosis present

## 2021-12-21 DIAGNOSIS — D472 Monoclonal gammopathy: Secondary | ICD-10-CM | POA: Diagnosis present

## 2021-12-21 DIAGNOSIS — M25562 Pain in left knee: Principal | ICD-10-CM

## 2021-12-21 DIAGNOSIS — Z880 Allergy status to penicillin: Secondary | ICD-10-CM

## 2021-12-21 DIAGNOSIS — H9190 Unspecified hearing loss, unspecified ear: Secondary | ICD-10-CM | POA: Diagnosis present

## 2021-12-21 LAB — CBC WITH DIFFERENTIAL/PLATELET
Abs Immature Granulocytes: 0.03 10*3/uL (ref 0.00–0.07)
Basophils Absolute: 0 10*3/uL (ref 0.0–0.1)
Basophils Relative: 0 %
Eosinophils Absolute: 0 10*3/uL (ref 0.0–0.5)
Eosinophils Relative: 0 %
HCT: 26.8 % — ABNORMAL LOW (ref 36.0–46.0)
Hemoglobin: 8.8 g/dL — ABNORMAL LOW (ref 12.0–15.0)
Immature Granulocytes: 1 %
Lymphocytes Relative: 52 %
Lymphs Abs: 2.7 10*3/uL (ref 0.7–4.0)
MCH: 31.9 pg (ref 26.0–34.0)
MCHC: 32.8 g/dL (ref 30.0–36.0)
MCV: 97.1 fL (ref 80.0–100.0)
Monocytes Absolute: 0.3 10*3/uL (ref 0.1–1.0)
Monocytes Relative: 6 %
Neutro Abs: 2.1 10*3/uL (ref 1.7–7.7)
Neutrophils Relative %: 41 %
Platelets: 41 10*3/uL — ABNORMAL LOW (ref 150–400)
RBC: 2.76 MIL/uL — ABNORMAL LOW (ref 3.87–5.11)
RDW: 14.1 % (ref 11.5–15.5)
WBC: 5.2 10*3/uL (ref 4.0–10.5)
nRBC: 0 % (ref 0.0–0.2)

## 2021-12-21 LAB — BASIC METABOLIC PANEL
Anion gap: 7 (ref 5–15)
BUN: 32 mg/dL — ABNORMAL HIGH (ref 8–23)
CO2: 26 mmol/L (ref 22–32)
Calcium: 9.3 mg/dL (ref 8.9–10.3)
Chloride: 104 mmol/L (ref 98–111)
Creatinine, Ser: 0.81 mg/dL (ref 0.44–1.00)
GFR, Estimated: >60 mL/min (ref >60)
Glucose, Bld: 103 mg/dL — ABNORMAL HIGH (ref 70–99)
Potassium: 4 mmol/L (ref 3.5–5.1)
Sodium: 137 mmol/L (ref 135–145)

## 2021-12-21 LAB — SEDIMENTATION RATE: Sed Rate: 89 mm/hr — ABNORMAL HIGH (ref 0–30)

## 2021-12-21 LAB — C-REACTIVE PROTEIN: CRP: 0.7 mg/dL (ref <1.0)

## 2021-12-21 MED ORDER — ASPIRIN 81 MG PO TBEC
81.0000 mg | DELAYED_RELEASE_TABLET | Freq: Every day | ORAL | Status: DC
  Administered 2021-12-22 – 2021-12-26 (×5): 81 mg via ORAL
  Filled 2021-12-21 (×5): qty 1

## 2021-12-21 MED ORDER — OCUVITE-LUTEIN PO CAPS
1.0000 | ORAL_CAPSULE | Freq: Every day | ORAL | Status: DC
  Administered 2021-12-22 – 2021-12-26 (×4): 1 via ORAL
  Filled 2021-12-21 (×5): qty 1

## 2021-12-21 MED ORDER — ALPRAZOLAM 0.25 MG PO TABS
0.1250 mg | ORAL_TABLET | Freq: Two times a day (BID) | ORAL | Status: DC | PRN
Start: 2021-12-21 — End: 2021-12-26
  Administered 2021-12-26: 0.125 mg via ORAL
  Filled 2021-12-21 (×2): qty 1

## 2021-12-21 MED ORDER — ACETAMINOPHEN 500 MG PO TABS
1000.0000 mg | ORAL_TABLET | Freq: Once | ORAL | Status: DC
  Filled 2021-12-21: qty 2

## 2021-12-21 MED ORDER — FAMOTIDINE 20 MG PO TABS
10.0000 mg | ORAL_TABLET | Freq: Every day | ORAL | Status: DC | PRN
  Administered 2021-12-23 – 2021-12-25 (×2): 10 mg via ORAL
  Filled 2021-12-21 (×2): qty 1

## 2021-12-21 MED ORDER — ACETAMINOPHEN 500 MG PO TABS
500.0000 mg | ORAL_TABLET | Freq: Four times a day (QID) | ORAL | Status: DC | PRN
  Filled 2021-12-21: qty 2

## 2021-12-21 MED ORDER — MORPHINE SULFATE (PF) 2 MG/ML IV SOLN
2.0000 mg | INTRAVENOUS | Status: DC | PRN
  Administered 2021-12-21: 2 mg via INTRAVENOUS
  Filled 2021-12-21: qty 1

## 2021-12-21 MED ORDER — LIDOCAINE 5 % EX PTCH
1.0000 | MEDICATED_PATCH | CUTANEOUS | Status: DC
  Administered 2021-12-21 – 2021-12-26 (×6): 1 via TRANSDERMAL
  Filled 2021-12-21 (×6): qty 1

## 2021-12-21 MED ORDER — OXYCODONE HCL 5 MG PO TABS
2.5000 mg | ORAL_TABLET | ORAL | Status: AC
  Administered 2021-12-21: 2.5 mg via ORAL
  Filled 2021-12-21: qty 1

## 2021-12-21 MED ORDER — ACETAMINOPHEN 325 MG PO TABS
650.0000 mg | ORAL_TABLET | Freq: Four times a day (QID) | ORAL | Status: DC | PRN
  Administered 2021-12-25 – 2021-12-26 (×3): 650 mg via ORAL
  Filled 2021-12-21 (×3): qty 2

## 2021-12-21 MED ORDER — VITAMIN D 25 MCG (1000 UNIT) PO TABS
2000.0000 [IU] | ORAL_TABLET | Freq: Every day | ORAL | Status: DC
  Administered 2021-12-22 – 2021-12-26 (×5): 2000 [IU] via ORAL
  Filled 2021-12-21 (×6): qty 2

## 2021-12-21 MED ORDER — METOPROLOL SUCCINATE ER 50 MG PO TB24
50.0000 mg | ORAL_TABLET | Freq: Every day | ORAL | Status: DC
  Administered 2021-12-22 – 2021-12-26 (×5): 50 mg via ORAL
  Filled 2021-12-21 (×5): qty 1

## 2021-12-21 MED ORDER — SODIUM CHLORIDE 0.9 % IV SOLN: INTRAVENOUS | Status: AC

## 2021-12-21 MED ORDER — HYDROCODONE-ACETAMINOPHEN 5-325 MG PO TABS
1.0000 | ORAL_TABLET | ORAL | Status: DC | PRN
  Administered 2021-12-22 – 2021-12-23 (×3): 1 via ORAL
  Filled 2021-12-21 (×4): qty 1

## 2021-12-21 MED ORDER — COQ10 50 MG PO CAPS: 50.0000 mg | ORAL_CAPSULE | Freq: Every day | ORAL | Status: DC

## 2021-12-21 MED ORDER — SODIUM CHLORIDE 0.9% FLUSH
3.0000 mL | Freq: Two times a day (BID) | INTRAVENOUS | Status: DC
Start: 2021-12-21 — End: 2021-12-26
  Administered 2021-12-21 – 2021-12-26 (×10): 3 mL via INTRAVENOUS

## 2021-12-21 MED ORDER — HEPARIN SODIUM (PORCINE) 5000 UNIT/ML IJ SOLN
5000.0000 [IU] | Freq: Two times a day (BID) | INTRAMUSCULAR | Status: DC
Start: 2021-12-21 — End: 2021-12-26
  Administered 2021-12-22 – 2021-12-26 (×9): 5000 [IU] via SUBCUTANEOUS
  Filled 2021-12-21 (×10): qty 1

## 2021-12-21 MED ORDER — ACETAMINOPHEN 650 MG RE SUPP: 650.0000 mg | Freq: Four times a day (QID) | RECTAL | Status: DC | PRN

## 2021-12-21 MED ORDER — SIMVASTATIN 20 MG PO TABS
10.0000 mg | ORAL_TABLET | Freq: Every day | ORAL | Status: DC
  Administered 2021-12-22 – 2021-12-25 (×4): 10 mg via ORAL
  Filled 2021-12-21 (×5): qty 1

## 2021-12-21 NOTE — ED Notes (Signed)
Will collect bloodwork and give tylenol when pt back from scans.

## 2021-12-21 NOTE — ED Notes (Signed)
Epic was in downtime. Labs were collected and IV placed at 1439. Will document now. Also pt is stating that due to her knee pain she cannot bear weight on her knee and therefore cannot take care of ADLs at home. Pt is requesting assistance to get placed in SNF. States that her daughter and husband have both died and she has no one to help her at home and only has home health at night.

## 2021-12-21 NOTE — ED Notes (Signed)
This RN brought pt. Graham crackers, more apple sauce, saltines, and fresh water with pain medication. Pt. Sitting up in bed, chatting with staff, denies further need at this time.

## 2021-12-21 NOTE — ED Provider Notes (Signed)
[facility] Provider Note    Event Date/Time   First MD Initiated Contact with Patient 12/21/21 1134     (approximate)   History   Knee Pain   HPI  Alicia Moran is a 88 y.o. female with past medical history of CVA, HDL, arthritis followed by orthopedics and peripheral neuropathy plus arthropathy on venlafaxine and gabapentin having undergone recent injection left knee on 6/30 per patient who presents for evaluation of some ongoing or severe pain in the left knee.  She denies any change in her chronic pain in her right knee back or other extremities.  No fevers, chills, cough, vomiting, diarrhea or urinary symptoms.  No rashes.  No recent bleeding.  She states she uses a walker to get around but has had severe pain when she tries to bear weight in the left lower extremity centered around the knee that has severely limited her ability to perform her ADLs.  She states she lives alone.  No recent falls injuries or trauma.    Past Medical History:  Diagnosis Date   Hyperlipidemia    Migraine headache    Stroke [facility])      Physical Exam  Triage Vital Signs: ED Triage Vitals [12/21/21 1123]  Enc Vitals Group     BP (!) 175/94     Pulse Rate 90     Resp 16     Temp 97.7 F (36.5 C)     Temp Source Oral     SpO2 96 %     Weight 128 lb 4.9 oz (58.2 kg)     Height _0  (1.575 m)     Head Circumference      Peak Flow      Pain Score 10     Pain Loc      Pain Edu?      Excl. in [place]?     Most recent vital signs: Vitals:   12/21/21 1455 12/21/21 1800  BP: (!) 165/70 (!) 160/78  Pulse: 79 80  Resp: 20 18  Temp:    SpO2: 96% 96%    General: Awake, appears fairly uncomfortable. CV:  Good peripheral perfusion.  2+ bilateral radial and PT pulses. Resp:  Normal effort.  Clear bilaterally. Abd:  No distention.  Soft. Other:  Sensation is intact to light touch throughout the bilateral lower extremities.  Patient does have fairly significant pain on  ranging the left knee no significant weakness compared to the right knee.  No significant pain decree strength at that bilateral hips or ankles.  Sensation is intact light touch throughout.  There is no significant erythema or large visible effusion.   ED Results / Procedures / Treatments  Labs (all labs ordered are listed, but only abnormal results are displayed) Labs Reviewed  CBC WITH DIFFERENTIAL/PLATELET - Abnormal; Notable for the following components:      Result Value   RBC 2.76 (*)    Hemoglobin 8.8 (*)    HCT 26.8 (*)    Platelets 41 (*)    All other components within normal limits  BASIC METABOLIC PANEL - Abnormal; Notable for the following components:   Glucose, Bld 103 (*)    BUN 32 (*)    All other components within normal limits  SEDIMENTATION RATE - Abnormal; Notable for the following components:   Sed Rate 89 (*)    All other components within normal limits  C-REACTIVE PROTEIN     EKG   RADIOLOGY  X-ray  of the left knee my interpretation without evidence of fracture or dislocation.  I also reviewed radiology's interpretation.  Left lower extremity ultrasound my interpretation without evidence of a DVT.  I also reviewed radiology's interpretation.  I agree with findings of what appears to be a reactive lymph node in left groin advised patient of this and recommendation for outpatient nonemergent follow-up.  CT of the left knee on my interpretation shows no evidence of fracture or large effusion.  I reviewed radiologist interpretation and agree to findings of degenerative changes and osteoporosis.   PROCEDURES:  Critical Care performed: Yes, see critical care procedure note(s)  Procedures    MEDICATIONS ORDERED IN ED: Medications  lidocaine (LIDODERM) 5 % 1 patch (1 patch Transdermal Patch Applied 12/21/21 1205)  acetaminophen (TYLENOL) tablet 1,000 mg (1,000 mg Oral Patient Refused/Not Given 12/21/21 1453)  metoprolol succinate (TOPROL-XL) 24 hr tablet 50  mg (has no administration in time range)  multivitamin-lutein (OCUVITE-LUTEIN) capsule 1 capsule (has no administration in time range)  famotidine (PEPCID) tablet 10 mg (has no administration in time range)  Cholecalciferol CAPS 2,000 Units (has no administration in time range)  CoQ10 CAPS 50 mg (has no administration in time range)  simvastatin (ZOCOR) tablet 10 mg (has no administration in time range)  acetaminophen (TYLENOL) tablet 500-1,000 mg (has no administration in time range)  oxyCODONE (Oxy IR/ROXICODONE) immediate release tablet 2.5 mg (2.5 mg Oral Given 12/21/21 1204)     IMPRESSION / MDM / ASSESSMENT AND PLAN / ED COURSE  I reviewed the triage vital signs and the nursing notes. Patient's presentation is most consistent with acute presentation with potential threat to life or bodily function.                               Differential diagnosis includes, but is not limited to worsening arthritis, DVT versus occult fracture.  No evidence of cellulitis or other clear infectious process at this time.  X-ray of the left knee my interpretation without evidence of fracture or dislocation.  I also reviewed radiology's interpretation.  Left lower extremity ultrasound my interpretation without evidence of a DVT.  I also reviewed radiology's interpretation.  I agree with findings of what appears to be a reactive lymph node in left groin advised patient of this and recommendation for outpatient nonemergent follow-up.  CT of the left knee on my interpretation shows no evidence of fracture or large effusion.  I reviewed radiologist interpretation and agree to findings of degenerative changes and osteoporosis.  BMP shows no significant lecture light or metabolic derangements.  CBC shows stable anemia and thrombocytopenia.  Hemoglobin on 6/20 was 8.5 and platelets were 17.  Today hemoglobin is 8.8 and platelets are 41.  ESR is slightly elevated at 89.  There is no effusion or fever or  leukocytosis and have a lower suspicion for a septic joint.  Did consult with orthopedics and patient was seen by Dr. Sharlet Salina recommended no emergent intervention pain control I recommended ice elevation activity modification and analgesia. Given patient has had acute worsening of her pain and inability to care for self or do her ADLs I think it is reasonable for her to be admitted for PT OT pain management and possible SNF placement.   FINAL CLINICAL IMPRESSION(S) / ED DIAGNOSES   Final diagnoses:  Acute pain of left knee     Rx / DC Orders   ED Discharge Orders  None        Note:  This document was prepared using Dragon voice recognition software and may include unintentional dictation errors.   Lucrezia Starch, MD 12/21/21 [phone]

## 2021-12-21 NOTE — ED Notes (Signed)
See triage note  Presents with pain to lateral left knee   States she was seen on Friday  Had a shot  but thinks the pain is worse  No swelling

## 2021-12-21 NOTE — H&P (Signed)
History and Physical    Patient: Alicia Moran BJY:[id number] DOB: May 22, 1929 DOA: 12/21/2021 DOS: the patient was seen and examined on 12/21/2021 PCP: [name], MD  Patient coming from: Home  Chief Complaint:  Chief Complaint  Patient presents with   Knee Pain   HPI: Alicia Moran is a 88 y.o. female with medical history significant of blindness in the right eye and arthritis of the left knee, ambulatory dysfunction due to severe arthritis diffusely in all her joints due to aging presenting to After receiving away shot in her left knee and pain and inability to ambulate states that she thinks that she needs to be placed in a nursing home as she cannot manage herself she does not have any caretaker for the day. In the emergency room orthopedics to saw patient to identified the complications of joint injection.  Ice and elevate   Review of Systems  Unable to perform ROS: Age  Constitutional:  Positive for malaise/fatigue.  Musculoskeletal:  Positive for joint pain and myalgias.  Neurological:  Positive for weakness.    Past Medical History:  Diagnosis Date   Hyperlipidemia    Migraine headache    Stroke [facility])    History reviewed. No pertinent surgical history. Social History:  reports that she quit smoking about 35 years ago. She has never used smokeless tobacco. She reports that she does not drink alcohol and does not use drugs.  Allergies  Allergen Reactions   Amoxicillin Diarrhea   Erythromycin Diarrhea   Nsaids Other (See Comments)    Other Reaction: GI ulcers    Family History  Problem Relation Age of Onset   Hypertension Mother    Diabetes Mother    Hypertension Father     Prior to Admission medications   Medication Sig Start Date End Date Taking? Authorizing Provider  acetaminophen (TYLENOL) 500 MG tablet Take 500-1,000 mg by mouth every 6 (six) hours as needed (pain).    [provider]  ALPRAZolam Prudy Feeler) 0.25 MG tablet Take 0.5 tablets  (0.125 mg total) by mouth 2 (two) times daily as needed for anxiety or sleep. 02/02/21   Gillis Santa, MD  aspirin 81 MG tablet Take 81 mg by mouth daily.    [provider]  Cholecalciferol 50 MCG (2000 UT) CAPS Take 2,000 Units by mouth daily.    [provider]  Coenzyme Q10 (COQ10) 50 MG CAPS Take 50 mg by mouth daily.    [provider]  famotidine (PEPCID) 10 MG tablet Take 10 mg by mouth daily as needed for heartburn or indigestion.    [provider]  glucosamine-chondroitin 500-400 MG tablet Take 1 tablet by mouth as directed.    [provider]  hydrOXYzine (ATARAX) 10 MG tablet Take 10 mg by mouth 2 (two) times daily as needed. 05/17/21   [provider]  loperamide (IMODIUM) 2 MG capsule     [provider]  methylPREDNISolone (MEDROL DOSEPAK) 4 MG TBPK tablet as directed. 10/09/21   [provider]  metoprolol succinate (TOPROL-XL) 50 MG 24 hr tablet TAKE 1 TABLET EVERY DAY 09/01/21   [name], MD  Multiple Vitamins-Minerals (PRESERVISION AREDS 2+MULTI VIT PO) Take 2 tablets by mouth daily.    [provider]  ondansetron (ZOFRAN) 4 MG tablet     [provider]  simvastatin (ZOCOR) 10 MG tablet TAKE 1 TABLET AT BEDTIME 12/04/21   [name], MD    Physical Exam: Vitals:  12/21/21 2000 12/21/21 2030 12/21/21 2100 12/21/21 2130  BP: (!) 146/60 (!) 157/64 (!) 157/64 130/60  Pulse:   78   Resp:   18   Temp:   97.9 F (36.6 C)   TempSrc:   Oral   SpO2:   95%   Weight:      Height:      Physical Exam Vitals and nursing note reviewed.  Constitutional:      General: She is not in acute distress.    Appearance: Normal appearance. She is not ill-appearing, toxic-appearing or diaphoretic.  HENT:     Head: Normocephalic and atraumatic.     Right Ear: Hearing and external ear normal.     Left Ear: Hearing and external ear normal.     Nose: Nose normal. No nasal deformity.      Mouth/Throat:     Lips: Pink.     Mouth: Mucous membranes are moist.     Tongue: No lesions.     Pharynx: Oropharynx is clear.  Eyes:     Extraocular Movements: Extraocular movements intact.     Pupils: Pupils are equal, round, and reactive to light.  Neck:     Vascular: No carotid bruit.  Cardiovascular:     Rate and Rhythm: Normal rate and regular rhythm.     Pulses: Normal pulses.     Heart sounds: Normal heart sounds.  Pulmonary:     Effort: Pulmonary effort is normal.     Breath sounds: Normal breath sounds.  Abdominal:     General: Bowel sounds are normal. There is no distension.     Palpations: Abdomen is soft. There is no mass.     Tenderness: There is no abdominal tenderness. There is no guarding.     Hernia: No hernia is present.  Musculoskeletal:     Right lower leg: No edema.     Left lower leg: No edema.  Skin:    General: Skin is warm.  Neurological:     General: No focal deficit present.     Mental Status: She is alert and oriented to person, place, and time.     Cranial Nerves: Cranial nerves 2-12 are intact.     Motor: Motor function is intact.  Psychiatric:        Attention and Perception: Attention normal.        Mood and Affect: Mood normal.        Speech: Speech normal.        Behavior: Behavior normal. Behavior is cooperative.        Cognition and Memory: Cognition normal.     Data Reviewed: Results for orders placed or performed during the hospital encounter of 12/21/21 (from the past 24 hour(s))  Sedimentation rate     Status: Abnormal   Collection Time: 12/21/21  2:00 PM  Result Value Ref Range   Sed Rate 89 (H) 0 - 30 mm/hr  CBC with Differential     Status: Abnormal   Collection Time: 12/21/21  2:04 PM  Result Value Ref Range   WBC 5.2 4.0 - 10.5 K/uL   RBC 2.76 (L) 3.87 - 5.11 MIL/uL   Hemoglobin 8.8 (L) 12.0 - 15.0 g/dL   HCT 27.2 (L) 53.6 - 64.4 %   MCV 97.1 80.0 - 100.0 fL   MCH 31.9 26.0 - 34.0 pg   MCHC 32.8 30.0 - 36.0 g/dL    RDW 03.4 74.2 - 59.5 %   Platelets 41 (L) 150 - 400  K/uL   nRBC 0.0 0.0 - 0.2 %   Neutrophils Relative % 41 %   Neutro Abs 2.1 1.7 - 7.7 K/uL   Lymphocytes Relative 52 %   Lymphs Abs 2.7 0.7 - 4.0 K/uL   Monocytes Relative 6 %   Monocytes Absolute 0.3 0.1 - 1.0 K/uL   Eosinophils Relative 0 %   Eosinophils Absolute 0.0 0.0 - 0.5 K/uL   Basophils Relative 0 %   Basophils Absolute 0.0 0.0 - 0.1 K/uL   Immature Granulocytes 1 %   Abs Immature Granulocytes 0.03 0.00 - 0.07 K/uL  Basic metabolic panel     Status: Abnormal   Collection Time: 12/21/21  2:04 PM  Result Value Ref Range   Sodium 137 135 - 145 mmol/L   Potassium 4.0 3.5 - 5.1 mmol/L   Chloride 104 98 - 111 mmol/L   CO2 26 22 - 32 mmol/L   Glucose, Bld 103 (H) 70 - 99 mg/dL   BUN 32 (H) 8 - 23 mg/dL   Creatinine, Ser 4.09 0.44 - 1.00 mg/dL   Calcium 9.3 8.9 - 81.1 mg/dL   GFR, Estimated >91 >47 mL/min   Anion gap 7 5 - 15     Assessment and Plan: * Generalized weakness Patient presenting with generalized weakness ambulatory dysfunction secondary to arthralgias joint pain and recent joint injections. Suspect patient will need placement in skilled nursing facility she is unable to take care of herself. Patient is at high risk of falls and injuring herself.  HTN (hypertension) Blood pressure 130/60, pulse 78, temperature 97.9 F (36.6 C), temperature source Oral, resp. rate 18, height 5\' 2"  (1.575 m), weight 58.2 kg, SpO2 95 %. Continue patient on metoprolol. As needed hydralazine.    Iron deficiency anemia due to chronic blood loss Pt has h/o of anemia;    Latest Ref Rng & Units 12/21/2021    2:04 PM 05/23/2021    9:58 AM 02/02/2021    5:57 AM  CBC  WBC 4.0 - 10.5 K/uL 5.2  5.6  5.7   Hemoglobin 12.0 - 15.0 g/dL 8.8  9.6  9.9   Hematocrit 36.0 - 46.0 % 26.8  27.9  28.6   Platelets 150 - 400 K/uL 41  83  113    We will type and screen and transfuse as needed. GI consult if needed but not  approrpittea  Paroxysmal atrial fibrillation (HCC) Currently patient is in sinus rhythm on my auscultation. We will continue patient on aspirin 81 and metoprolol 50      Advance Care Planning:    Code Status: Full Code   Consults:  Dr.Crawford- orthopedic.    Family Communication:  Katherina Right (Niece)  [phone] (Mobile)  Severity of Illness: The appropriate patient status for this patient is OBSERVATION. Observation status is judged to be reasonable and necessary in order to provide the required intensity of service to ensure the patient's safety. The patient's presenting symptoms, physical exam findings, and initial radiographic and laboratory data in the context of their medical condition is felt to place them at decreased risk for further clinical deterioration. Furthermore, it is anticipated that the patient will be medically stable for discharge from the hospital within 2 midnights of admission.   Author: Gertha Calkin, MD 12/21/2021 11:47 PM  For on call review [email or web address].

## 2021-12-21 NOTE — Assessment & Plan Note (Signed)
Patient presenting with generalized weakness ambulatory dysfunction secondary to arthralgias joint pain and recent joint injections. Suspect patient will need placement in skilled nursing facility she is unable to take care of herself. Patient is at high risk of falls and injuring herself.

## 2021-12-21 NOTE — Assessment & Plan Note (Signed)
Blood pressure 130/60, pulse 78, temperature 97.9 F (36.6 C), temperature source Oral, resp. rate 18, height '5\' 2"'$  (1.575 m), weight 58.2 kg, SpO2 95 %. Continue patient on metoprolol. As needed hydralazine.

## 2021-12-21 NOTE — ED Triage Notes (Signed)
Pt here via [facility] with left knee pain. Pt had a shot in that knee on Fri, states it made the pain worse. Pt has not been able to ambulate. Pt here with caregiver.  180/94 90 95% RA

## 2021-12-21 NOTE — Assessment & Plan Note (Signed)
Pt has h/o of anemia;    Latest Ref Rng & Units 12/21/2021    2:04 PM 05/23/2021    9:58 AM 02/02/2021    5:57 AM  CBC  WBC 4.0 - 10.5 K/uL 5.2  5.6  5.7   Hemoglobin 12.0 - 15.0 g/dL 8.8  9.6  9.9   Hematocrit 36.0 - 46.0 % 26.8  27.9  28.6   Platelets 150 - 400 K/uL 41  83  113    We will type and screen and transfuse as needed. GI consult if needed but not approrpittea

## 2021-12-21 NOTE — Assessment & Plan Note (Signed)
Currently patient is in sinus rhythm on my auscultation. We will continue patient on aspirin 81 and metoprolol 50

## 2021-12-21 NOTE — ED Notes (Signed)
Pt calling out for pain meds. Pt declines tylenol earlier. Informed pt will let EDP know.

## 2021-12-21 NOTE — Consult Note (Signed)
ORTHOPAEDIC CONSULTATION  REQUESTING PHYSICIAN: Lucrezia Starch, MD  Chief Complaint: Left knee pain,/left knee arthritis  HPI: Alicia Moran is a 88 y.o. female who complains of worsening left knee pain.  Patient has been followed by Dr. Mack Guise regarding her left knee arthritis.  Viscosupplementation injections were ordered and she was given an injection on 6/29.  She is scheduled for additional injection tomorrow 7/7 with Dr. Mack Guise.  Pain has been worse since her injection on 6/29 she came to the ER as she feels she is unable to take care of herself.  She has advanced tricompartmental arthritis within the left knee.  Orthopedics was consulted as patient is unlikely to make her outpatient clinic visit on 7/7 with Dr. Mack Guise.    Past Medical History:  Diagnosis Date   Hyperlipidemia    Migraine headache    Stroke [facility])    History reviewed. No pertinent surgical history. Social History   Socioeconomic History   Marital status: Widowed    Spouse name: Not on file   Number of children: Not on file   Years of education: Not on file   Highest education level: Not on file  Occupational History   Not on file  Tobacco Use   Smoking status: Former    Types: Cigarettes    Quit date: 03/29/1986    Years since quitting: 35.7   Smokeless tobacco: Never  Substance and Sexual Activity   Alcohol use: No   Drug use: No   Sexual activity: Not on file  Other Topics Concern   Not on file  Social History Narrative   Not on file   Social Determinants of Health   Financial Resource Strain: Not on file  Food Insecurity: Not on file  Transportation Needs: Not on file  Physical Activity: Not on file  Stress: Not on file  Social Connections: Not on file   Family History  Problem Relation Age of Onset   Hypertension Mother    Diabetes Mother    Hypertension Father    Allergies  Allergen Reactions   Amoxicillin Diarrhea   Erythromycin    Nsaids Other (See  Comments)    Other Reaction: GI ulcers   Prior to Admission medications   Medication Sig Start Date End Date Taking? Authorizing Provider  acetaminophen (TYLENOL) 500 MG tablet Take 500-1,000 mg by mouth every 6 (six) hours as needed (pain).    [provider]  ALPRAZolam Duanne Moron) 0.25 MG tablet Take 0.5 tablets (0.125 mg total) by mouth 2 (two) times daily as needed for anxiety or sleep. 02/02/21   Val Riles, MD  aspirin 81 MG tablet Take 81 mg by mouth daily.    [provider]  Cholecalciferol 50 MCG (2000 UT) CAPS Take 2,000 Units by mouth daily.    [provider]  Coenzyme Q10 (COQ10) 50 MG CAPS Take 50 mg by mouth daily.    [provider]  famotidine (PEPCID) 10 MG tablet Take 10 mg by mouth daily as needed for heartburn or indigestion.    [provider]  glucosamine-chondroitin 500-400 MG tablet Take 1 tablet by mouth as directed.    [provider]  hydrOXYzine (ATARAX) 10 MG tablet Take 10 mg by mouth 2 (two) times daily as needed. 05/17/21   [provider]  loperamide (IMODIUM) 2 MG capsule     [provider]  methylPREDNISolone (MEDROL DOSEPAK) 4 MG TBPK tablet as directed. 10/09/21   [provider]  metoprolol succinate (TOPROL-XL)  50 MG 24 hr tablet TAKE 1 TABLET EVERY DAY 09/01/21   Wellington Hampshire, MD  Multiple Vitamins-Minerals (PRESERVISION AREDS 2+MULTI VIT PO) Take 2 tablets by mouth daily.    [provider]  ondansetron (ZOFRAN) 4 MG tablet     [provider]  simvastatin (ZOCOR) 10 MG tablet TAKE 1 TABLET AT BEDTIME 12/04/21   Wellington Hampshire, MD   CT Knee Left Wo Contrast  Result Date: 12/21/2021 CLINICAL DATA:  Painful ambulation. EXAM: CT OF THE LEFT KNEE WITHOUT CONTRAST TECHNIQUE: Multidetector CT imaging of the left knee was performed according to the standard protocol. Multiplanar CT image reconstructions were also generated. RADIATION DOSE REDUCTION: This  exam was performed according to the departmental dose-optimization program which includes automated exposure control, adjustment of the mA and/or kV according to patient size and/or use of iterative reconstruction technique. COMPARISON:  Radiographs, same date. FINDINGS: No acute fracture is identified. Age related degenerative changes and osteoporosis. Grossly by CT the cruciate and collateral ligaments are intact. The quadriceps and patellar tendons are intact. No joint effusion or Baker's cyst. Age related vascular calcifications. On the prior MRI the patient had a cystic lesion in the distal femur which is difficult to identify on the CT scan but overall it think this appears stable measuring approximately 4 cm. If patient's symptoms persist I would recommend a MRI for further evaluation. IMPRESSION: 1. No acute fracture. 2. Age related degenerative changes and osteoporosis. 3. Grossly by CT the cruciate and collateral ligaments are intact. 4. If symptoms persist or worsen MRI may be helpful for further evaluation. Electronically Signed   By: Marijo Sanes M.D.   On: 12/21/2021 15:13   US Venous Img Lower Unilateral Left  Result Date: 12/21/2021 CLINICAL DATA:  A 88 year old female presents for evaluation of lower extremity calf pain for 1 week, no reported edema. EXAM: LEFT LOWER EXTREMITY VENOUS DOPPLER ULTRASOUND TECHNIQUE: Gray-scale sonography with compression, as well as color and duplex ultrasound, were performed to evaluate the deep venous system(s) from the level of the common femoral vein through the popliteal and proximal calf veins. COMPARISON:  CT of the abdomen and pelvis from January 29, 2021. FINDINGS: VENOUS Normal compressibility of the common femoral, superficial femoral, and popliteal veins, as well as the visualized calf veins. Visualized portions of profunda femoral vein and great saphenous vein unremarkable. No filling defects to suggest DVT on grayscale or color Doppler imaging. Doppler  waveforms show normal direction of venous flow, normal respiratory plasticity and response to augmentation. Limited views of the contralateral common femoral vein are unremarkable. OTHER Small lymph node 1.6 x 0.8 x 0.9 cm in the LEFT groin is hypoechoic with compression of the sinus. Limitations: none IMPRESSION: Negative for sonographic evidence of DVT in the LEFT lower extremity. What is likely a reactive type lymph node in the LEFT groin. Imaging features are indeterminate given hypoechoic appearance and thickening of the cortex. Consider follow-up ultrasound evaluation of this area in 6-8 weeks and correlation with any evidence of ongoing inflammation. Electronically Signed   By: Zetta Bills M.D.   On: 12/21/2021 12:29   DG Knee Complete 4 Views Left  Result Date: 12/21/2021 CLINICAL DATA:  Left knee pain.  Recent left knee injection. EXAM: LEFT KNEE - COMPLETE 4+ VIEW COMPARISON:  MRI left knee 10/11/2017 FINDINGS: Negative for acute fracture. Supracondylar fracture noted on the prior MRI peers to have healed. Cyst in the distal femur on MRI not seen on x-ray. No significant  degenerative change or effusion. Arterial calcification IMPRESSION: No acute abnormality. Electronically Signed   By: Franchot Gallo M.D.   On: 12/21/2021 12:01    Positive ROS: All other systems have been reviewed and were otherwise negative with the exception of those mentioned in the HPI and as above.  Physical Exam: General: Alert, no acute distress Cardiovascular: No pedal edema Respiratory: No cyanosis, no use of accessory musculature GI: No organomegaly, abdomen is soft and non-tender Skin: No lesions in the area of chief complaint Neurologic: Sensation intact distally Psychiatric: Patient is competent for consent with normal mood and affect Lymphatic: No axillary or cervical lymphadenopathy  MUSCULOSKELETAL:   Left knee: There is no effusion, no significant erythema noted.  Patient has tenderness with  palpation at the medial and lateral joint line.  Patient is able to perform straight leg raise, tolerates range of motion from 0 to 40 degrees of flexion.  Left lower extremities grossly neurovascular intact   Assessment: 88 year old female with tricompartmental left knee arthritis, worse after viscosupplementation injection on 6/29  Plan: I had a long discussion with the patient regarding her symptoms.  It does not appear that she is having any serious synovitic or infectious reaction to the viscosupplementation injection as she does not have any evidence of effusion.  She finished a Medrol Dosepak today which was prescribed for a lumbar radiculopathy.  I recommend ice, elevation, activity modification, pain medication as needed.  I will notify Dr. Mack Guise that this patient will likely be in the hospital for the next few days and will miss her appointment tomorrow as she is unable to take care of herself at home.    Renee Harder, MD    12/21/2021 5:46 PM

## 2021-12-22 ENCOUNTER — Encounter: Payer: Self-pay | Admitting: Internal Medicine

## 2021-12-22 DIAGNOSIS — I480 Paroxysmal atrial fibrillation: Secondary | ICD-10-CM

## 2021-12-22 DIAGNOSIS — M1712 Unilateral primary osteoarthritis, left knee: Secondary | ICD-10-CM | POA: Diagnosis not present

## 2021-12-22 DIAGNOSIS — R531 Weakness: Secondary | ICD-10-CM | POA: Diagnosis not present

## 2021-12-22 LAB — CBC
HCT: 27.6 % — ABNORMAL LOW (ref 36.0–46.0)
Hemoglobin: 9.3 g/dL — ABNORMAL LOW (ref 12.0–15.0)
MCH: 32.5 pg (ref 26.0–34.0)
MCHC: 33.7 g/dL (ref 30.0–36.0)
MCV: 96.5 fL (ref 80.0–100.0)
Platelets: 42 10*3/uL — ABNORMAL LOW (ref 150–400)
RBC: 2.86 MIL/uL — ABNORMAL LOW (ref 3.87–5.11)
RDW: 14 % (ref 11.5–15.5)
WBC: 5 10*3/uL (ref 4.0–10.5)
nRBC: 0 % (ref 0.0–0.2)

## 2021-12-22 LAB — COMPREHENSIVE METABOLIC PANEL
ALT: 12 U/L (ref 0–44)
AST: 17 U/L (ref 15–41)
Albumin: 3.8 g/dL (ref 3.5–5.0)
Alkaline Phosphatase: 68 U/L (ref 38–126)
Anion gap: 3 — ABNORMAL LOW (ref 5–15)
BUN: 24 mg/dL — ABNORMAL HIGH (ref 8–23)
CO2: 28 mmol/L (ref 22–32)
Calcium: 8.9 mg/dL (ref 8.9–10.3)
Chloride: 108 mmol/L (ref 98–111)
Creatinine, Ser: 0.72 mg/dL (ref 0.44–1.00)
GFR, Estimated: >60 mL/min (ref >60)
Glucose, Bld: 91 mg/dL (ref 70–99)
Potassium: 4.3 mmol/L (ref 3.5–5.1)
Sodium: 139 mmol/L (ref 135–145)
Total Bilirubin: 0.8 mg/dL (ref 0.3–1.2)
Total Protein: 7.1 g/dL (ref 6.5–8.1)

## 2021-12-22 MED ORDER — SENNOSIDES-DOCUSATE SODIUM 8.6-50 MG PO TABS
1.0000 | ORAL_TABLET | Freq: Two times a day (BID) | ORAL | Status: DC | PRN
  Administered 2021-12-23: 1 via ORAL
  Filled 2021-12-22 (×2): qty 1

## 2021-12-22 MED ORDER — ENSURE ENLIVE PO LIQD
237.0000 mL | Freq: Two times a day (BID) | ORAL | Status: DC
  Administered 2021-12-22 – 2021-12-26 (×9): 237 mL via ORAL

## 2021-12-22 MED ORDER — ROPINIROLE HCL 0.25 MG PO TABS
0.2500 mg | ORAL_TABLET | Freq: Every day | ORAL | Status: DC
  Administered 2021-12-22 – 2021-12-23 (×2): 0.25 mg via ORAL
  Filled 2021-12-22 (×2): qty 1

## 2021-12-22 NOTE — Evaluation (Signed)
Physical Therapy Evaluation Patient Details Name: Alicia Moran MRN: [id number] DOB: [birth year] Today's Date: 12/22/2021  History of Present Illness  Pt is a 23 F admitted on 12/21/21 after presenting with c/o L knee pain & inability to care for herself. PMH: R eye blindness, L knee arthritis, arthritis, HLD, stroke  Clinical Impression  Pt seen for PT evaluation with pt agreeable to tx. Pt reports prior to admission she was ambulatory with 3 wheeled walker in a 1 level home with ramped entrance. Pt reports she has a personal care aide that stays with her 7 nights/week but no daytime assistance other than for running errands. Pt reports she has had increasing difficulty mobilizing 2/2 L knee pain. On this date pt required encouragement to attempt standing but does so with RW & min assist but decreased weight bearing through LLE. Pt declined gait at this time but was educated on & performed LLE exercises. PT encouraged mobility as able. Pt is unsafe to return home alone during the day & requires assistance for functional mobility, so would benefit from STR upon d/c.    Recommendations for follow up therapy are one component of a multi-disciplinary discharge planning process, led by the attending physician.  Recommendations may be updated based on patient status, additional functional criteria and insurance authorization.  Follow Up Recommendations Skilled nursing-short term rehab (<3 hours/day) Can patient physically be transported by private vehicle: No    Assistance Recommended at Discharge Frequent or constant Supervision/Assistance  Patient can return home with the following  A little help with walking and/or transfers;A little help with bathing/dressing/bathroom;Assistance with cooking/housework;Direct supervision/assist for medications management;Help with stairs or ramp for entrance;Assist for transportation    Equipment Recommendations None recommended by PT  Recommendations for Other  Services       Functional Status Assessment Patient has had a recent decline in their functional status and demonstrates the ability to make significant improvements in function in a reasonable and predictable amount of time.     Precautions / Restrictions Precautions Precautions: Fall Restrictions Weight Bearing Restrictions: No      Mobility  Bed Mobility               General bed mobility comments: not observed, pt received & left in recliner    Transfers Overall transfer level: Needs assistance Equipment used: Rolling walker (2 wheels) Transfers: Sit to/from Stand Sit to Stand: Min assist           General transfer comment: cuing for BUE/BLE placement, to scoot out to edge of seat    Ambulation/Gait Ambulation/Gait assistance:  (declined)                Stairs            Wheelchair Mobility    Modified Rankin (Stroke Patients Only)       Balance Overall balance assessment: Needs assistance Sitting-balance support: Feet supported Sitting balance-Leahy Scale: Good     Standing balance support: Bilateral upper extremity supported Standing balance-Leahy Scale: Fair                               Pertinent Vitals/Pain Pain Assessment Pain Assessment: Faces Faces Pain Scale: Hurts whole lot Pain Location: L knee Pain Descriptors / Indicators: Discomfort Pain Intervention(s): Monitored during session, Premedicated before session    Home Living Family/patient expects to be discharged to:: Private residence Living Arrangements: Alone Available Help at Discharge: Personal care  attendant Type of Home: House Home Access: Ramped entrance       Home Layout: One level   Additional Comments: 3 wheeled walker    Prior Function Prior Level of Function : Needs assist             Mobility Comments: ambulatory with 3 wheeled walker, denies falls since 2014, sits in lift chair but no assistance to get in/out of bed        Hand Dominance        Extremity/Trunk Assessment        Lower Extremity Assessment Lower Extremity Assessment: LLE deficits/detail LLE Deficits / Details: 3-/5 knee extension LLE (lacks ~30 degrees full knee extension 2/2 pain), 2/5 hip flexion in sitting       Communication   Communication: HOH (blind R eye, decreased vision L eye)  Cognition Arousal/Alertness: Awake/alert Behavior During Therapy: WFL for tasks assessed/performed Overall Cognitive Status: Within Functional Limits for tasks assessed                                          General Comments      Exercises General Exercises - Lower Extremity Long Arc Quad: AROM, Strengthening, Left, 10 reps, Seated Hip Flexion/Marching: AROM, Strengthening, Left, 10 reps, Seated   Assessment/Plan    PT Assessment Patient needs continued PT services  PT Problem List Decreased strength;Pain;Decreased range of motion;Decreased activity tolerance;Decreased balance;Decreased mobility;Decreased knowledge of use of DME       PT Treatment Interventions DME instruction;Therapeutic exercise;Gait training;Balance training;Stair training;Neuromuscular re-education;Functional mobility training;Patient/family education;Therapeutic activities;Modalities;Manual techniques    PT Goals (Current goals can be found in the Care Plan section)  Acute Rehab PT Goals Patient Stated Goal: decreased pain, go to nursing home PT Goal Formulation: With patient Time For Goal Achievement: 01/05/22 Potential to Achieve Goals: Good    Frequency Min 2X/week     Co-evaluation               AM-PAC PT "6 Clicks" Mobility  Outcome Measure Help needed turning from your back to your side while in a flat bed without using bedrails?: None Help needed moving from lying on your back to sitting on the side of a flat bed without using bedrails?: A Little Help needed moving to and from a bed to a chair (including a wheelchair)?: A  Little Help needed standing up from a chair using your arms (e.g., wheelchair or bedside chair)?: A Little Help needed to walk in hospital room?: A Lot Help needed climbing 3-5 steps with a railing? : A Lot 6 Click Score: 17    End of Session Equipment Utilized During Treatment: Gait belt Activity Tolerance: Patient limited by pain Patient left: in chair;with call bell/phone within reach Nurse Communication: Mobility status PT Visit Diagnosis: Muscle weakness (generalized) (M62.81);Difficulty in walking, not elsewhere classified (R26.2);Pain Pain - Right/Left: Left Pain - part of body: Knee    Time: 1047-1100 PT Time Calculation (min) (ACUTE ONLY): 13 min   Charges:   PT Evaluation $PT Eval Low Complexity: 1 Low          Aleda Grana, PT, DPT 12/22/21, 11:17 AM  Sandi Mariscal 12/22/2021, 11:15 AM

## 2021-12-22 NOTE — TOC Initial Note (Addendum)
Transition of Care [facility]) - Initial/Assessment Note    Patient Details  Name: Alicia Moran MRN: [id number] Date of Birth: 1928/12/22  Transition of Care [facility]) CM/SW Contact:    Liliana Cline, LCSW Phone Number: 12/22/2021, 1:32 PM  Clinical Narrative:                 CSW spoke with patient regarding SNF rec. Patient lives alone but has a private pay aide/sitter 7 nights a week. Patient is alone during the day. PCP is Dr. Arlana Pouch. [facility]acy [facility].  Patient has a walker, transport wheelchair, and grab bars at home. Patient went to [facility] in the past for STR. Patient's aide provides transportation to errands and appointments. Patient is agreeable to SNF and prefers [facility]. Explained this is based on availability. Patient states she hopes to apply to become a ILF/ALF resident at [facility]. Patient says her second choice for STR is [facility]. CSW started SNF work up. CSW spoke with Sue Lush at [facility] who stated they do not have any STR beds at this time. Sue Lush stated patient would need to reach out to Felisa Bonier at [facility] at [phone] to start the process for ILF/ALF placement which is a lengthy process. CSW updated patient on this.  Asked Armed forces operational officer (patient's next top choice) to review referral.   1:50- CSW was notified by [facility] that patient will not meet inpatient criteria, therefore would not qualify for SNF through her Medicare coverage.  CSW spoke to patient and explained this. Explained options of private pay SNF, adding more private pay aide hours and setting up [facility]. Patient asks if there is any way this can be reconsidered, patient says she does not feel she can go home safely and does not understand why Medicare would not cover SNF. Notified UR, Ortho MD, and Attending MD.    2:05- Per Dr. Martha Clan and PT, patient does need SNF. Per UR, even if patient needs SNF her Medicare would not cover due to not meeting Inpatient criteria.   Per PT if  patient returns home she will need someone with her 24 hours a day.  CSW spoke to patient again, explained options. Patient asks that CSW call her back later to discuss. Per Verlon Au at [facility], private pay SNF would be $375 per day (not including therapy) and they would require 2 weeks payment up front. Per Verlon Au it would be estimated $10,000 total for 2 weeks.    4:25- Spoke to patient. Patient says she has to think about it. Patient says she does not have anyone to stay with her during the day. She says she knows about agency options for private pay aide services during the day and the cost to private pay for SNF.  Patient continues to say she does not understand why she does not qualify for Inpatient. Provided reassurance.  Patient states she is going to think about what to do. TOC to follow up tomorrow.  Patient asks if there is a patient advocate. Provided number Office of Patient Experience.    Expected Discharge Plan: Skilled Nursing Facility Barriers to Discharge: Continued Medical Work up   Patient Goals and CMS Choice Patient states their goals for this hospitalization and ongoing recovery are:: SNF CMS Medicare.gov Compare Post Acute Care list provided to:: Patient Choice offered to / list presented to : Patient  Expected Discharge Plan and Services Expected Discharge Plan: Skilled Nursing Facility       Living arrangements for  the past 2 months: Single Family Home                                      Prior Living Arrangements/Services Living arrangements for the past 2 months: Single Family Home Lives with:: Self Patient language and need for interpreter reviewed:: Yes Do you feel safe going back to the place where you live?: Yes      Need for Family Participation in Patient Care: Yes (Comment) Care giver support system in place?: Yes (comment) Current home services: Homehealth aide, DME Criminal Activity/Legal Involvement Pertinent to Current  Situation/Hospitalization: No - Comment as needed  Activities of Daily Living Home Assistive Devices/Equipment: Dan Humphreys (specify type)    Permission Sought/Granted Permission sought to share information with : Facility Industrial/product designer granted to share information with : Yes, Verbal Permission Granted     Permission granted to share info w AGENCY: SNFs        Emotional Assessment       Orientation: : Oriented to Self, Oriented to Place, Oriented to  Time, Oriented to Situation Alcohol / Substance Use: Not Applicable Psych Involvement: No (comment)  Admission diagnosis:  Arthritis [M19.90] Thrombocytopenia (HCC) [D69.6] Generalized weakness [R53.1] Acute pain of left knee [M25.562] Anemia, unspecified type [D64.9] Patient Active Problem List   Diagnosis Date Noted   Malnutrition of moderate degree 01/30/2021   Colitis due to Clostridium difficile 01/30/2021   Acute pain of right knee    Generalized weakness 01/29/2021   Disorder of bursae of shoulder region 03/15/2020   Cervical spondylosis 06/30/2018   Muscle strain of left scapular region 06/30/2018   Degenerative tear of medial meniscus of left knee 03/12/2018   Rotator cuff tendinitis, right 03/12/2018   Stress fracture of right calcaneus 01/14/2018   Left knee pain 10/23/2017   Stress fracture of left femur with delayed healing 10/23/2017   Osteoarthritis of knee 09/26/2017   Dysuria 01/19/2017   Acute bronchitis 09/19/2016   HTN (hypertension) 09/13/2016   Monoclonal gammopathy of unknown significance (MGUS) 09/11/2016   Fatigue 05/05/2016   Thrombocytopenia (HCC) 05/03/2016   Osteoporosis 03/20/2016   Valvular heart disease 12/23/2015   Constipation 09/04/2015   Perforation of right tympanic membrane 03/28/2015   Mild cerebral atrophy (HCC) 03/02/2015   Neuropathy 03/02/2015   History of falling, presenting hazards to health 07/17/2014   Iron deficiency anemia due to chronic blood loss  07/17/2014   Positive FIT (fecal immunochemical test) 07/17/2014   Atherosclerotic peripheral vascular disease (HCC) 07/17/2014   H/O: CVA (cerebrovascular accident) 06/14/2014   Subdural hematoma (HCC) 06/12/2014   Adjustment disorder with mixed anxiety and depressed mood 10/31/2013   History of Clostridium difficile colitis 03/05/2013   Intertrochanteric fracture of right hip (HCC) 03/05/2013   Lens replaced by other means 04/25/2012   Senile cataract 04/25/2012   Severe stage glaucoma 12/01/2011   Paroxysmal atrial fibrillation (HCC)    Stroke (HCC)    Migraine headache    High cholesterol    PCP:  [name], MD Pharmacy:   [address] [place], [place] - [address] [place] [address] Phone: [phone] Fax: [phone]  [facility] - [place], [place] - [address] [place] [address] Phone: [phone] Fax: [phone]  [facility] - [place], [place] - [address] [place] [address]  Phone: [phone] Fax: [phone]     Social Determinants of Health (SDOH) Interventions    Readmission Risk Interventions     No data to display

## 2021-12-22 NOTE — Progress Notes (Signed)
Initial Nutrition Assessment  DOCUMENTATION CODES:   Not applicable  INTERVENTION:  Liberalize diet from a heart healthy to a regular diet to provide widest variety of menu options to enhance nutritional adequacy Ensure Enlive po BID, each supplement provides 350 kcal and 20 grams of protein. Meal ordering with assistance  NUTRITION DIAGNOSIS:   Increased nutrient needs related to acute illness as evidenced by estimated needs.  GOAL:   Patient will meet greater than or equal to 90% of their needs  MONITOR:   PO intake, Supplement acceptance, Labs, Weight trends  REASON FOR ASSESSMENT:   Malnutrition Screening Tool    ASSESSMENT:   Pt admitted with generalized weakness and knee pain. PMH significant for R eye blindness and arthritis of L knee and ambulatory dysfunction d/t severe arthritis diffusely in all joints d/t aging.  Pt sitting in chair during time of visit. She reports having an appetite and PO intake at baseline. She has someone that assists with her grocery shopping at home. She reports drinking Boost for breakfast with graham crackers; sometimes [employer] with vegetables for lunch; and Boost with cheese toast for dinner. She has difficulty seeing the menu as she is blind in one eye and has difficulty with her other eye. Reviewed menu with pt and discussed menu options so she is aware what is offered. She denies swallowing difficulties.   Reviewed wt history. Pt is noted to have had a 4.8% since 01/29/21 which is not significant for time frame.   Medications: Vitamin D3 2000 units daily, ocuvite  Labs: BUN 24, anion gap 3  NUTRITION - FOCUSED PHYSICAL EXAM: Other providers present at time of visit. Deferred to follow up.  Diet Order:   Diet Order             Diet regular Room service appropriate? Yes; Fluid consistency: Thin  Diet effective now                   EDUCATION NEEDS:   Education needs have been addressed  Skin:  Skin Assessment: Reviewed RN  Assessment  Last BM:  7/7 (type 2)  Height:   Ht Readings from Last 1 Encounters:  12/21/21 '5\' 2"'$  (1.575 m)    Weight:   Wt Readings from Last 1 Encounters:  12/22/21 57.4 kg   BMI:  Body mass index is 23.15 kg/m.  Estimated Nutritional Needs:   Kcal:  1300-1500  Protein:  60-75g  Fluid:  >/=1.5L  Clayborne Dana, RDN, LDN Clinical Nutrition

## 2021-12-22 NOTE — Progress Notes (Signed)
Subjective:  Patient admitted overnight for left knee pain.  Patient states she has not been unable to weight-bear on the left lower extremity.  She was struggling at home due to her inability to weight-bear and presented to the ED.  Objective:   VITALS:   Vitals:   12/21/21 2130 12/22/21 0500 12/22/21 0601 12/22/21 0800  BP: 130/60  104/67 116/77  Pulse:   70 79  Resp:   16 20  Temp:   98 F (36.7 C) 98.5 F (36.9 C)  TempSrc:      SpO2:   98% 95%  Weight:  57.4 kg    Height:        PHYSICAL EXAM: Left lower extremity: Patient without erythema, ecchymosis or significant effusion.  He has tenderness over the medial greater than lateral joint line.  He has patellofemoral crepitus and grind.  Patient can actively flex her knee to approximately 90 degrees and extend with mild to moderate pain. Neurovascular intact Sensation intact distally Intact pulses distally Dorsiflexion/Plantar flexion intact No cellulitis present Compartment soft  LABS  Results for orders placed or performed during the hospital encounter of 12/21/21 (from the past 24 hour(s))  Sedimentation rate     Status: Abnormal   Collection Time: 12/21/21  2:00 PM  Result Value Ref Range   Sed Rate 89 (H) 0 - 30 mm/hr  CBC with Differential     Status: Abnormal   Collection Time: 12/21/21  2:04 PM  Result Value Ref Range   WBC 5.2 4.0 - 10.5 K/uL   RBC 2.76 (L) 3.87 - 5.11 MIL/uL   Hemoglobin 8.8 (L) 12.0 - 15.0 g/dL   HCT 26.8 (L) 36.0 - 46.0 %   MCV 97.1 80.0 - 100.0 fL   MCH 31.9 26.0 - 34.0 pg   MCHC 32.8 30.0 - 36.0 g/dL   RDW 14.1 11.5 - 15.5 %   Platelets 41 (L) 150 - 400 K/uL   nRBC 0.0 0.0 - 0.2 %   Neutrophils Relative % 41 %   Neutro Abs 2.1 1.7 - 7.7 K/uL   Lymphocytes Relative 52 %   Lymphs Abs 2.7 0.7 - 4.0 K/uL   Monocytes Relative 6 %   Monocytes Absolute 0.3 0.1 - 1.0 K/uL   Eosinophils Relative 0 %   Eosinophils Absolute 0.0 0.0 - 0.5 K/uL   Basophils Relative 0 %   Basophils  Absolute 0.0 0.0 - 0.1 K/uL   Immature Granulocytes 1 %   Abs Immature Granulocytes 0.03 0.00 - 0.07 K/uL  Basic metabolic panel     Status: Abnormal   Collection Time: 12/21/21  2:04 PM  Result Value Ref Range   Sodium 137 135 - 145 mmol/L   Potassium 4.0 3.5 - 5.1 mmol/L   Chloride 104 98 - 111 mmol/L   CO2 26 22 - 32 mmol/L   Glucose, Bld 103 (H) 70 - 99 mg/dL   BUN 32 (H) 8 - 23 mg/dL   Creatinine, Ser 0.81 0.44 - 1.00 mg/dL   Calcium 9.3 8.9 - 10.3 mg/dL   GFR, Estimated >60 >60 mL/min   Anion gap 7 5 - 15  C-reactive protein     Status: None   Collection Time: 12/21/21  2:04 PM  Result Value Ref Range   CRP 0.7 <1.0 mg/dL  Type and screen     Status: None (Preliminary result)   Collection Time: 12/21/21 11:57 PM  Result Value Ref Range   ABO/RH(D) O POS  Antibody Screen POS    Sample Expiration 12/24/2021,2359    Antibody Identification      UNIDENTIFIED ANTIBODY, ALL CELLS REACTIVE NON SPECIFIC ANTIBODY REACTIVITY   Unit Number [id number]    Blood Component Type RED CELLS,LR    Unit division 00    Status of Unit ALLOCATED    Transfusion Status OK TO TRANSFUSE    Crossmatch Result COMPATIBLE    Unit Number [id number]    Blood Component Type RED CELLS,LR    Unit division 00    Status of Unit ALLOCATED    Transfusion Status OK TO TRANSFUSE    Crossmatch Result COMPATIBLE   Comprehensive metabolic panel     Status: Abnormal   Collection Time: 12/22/21  4:18 AM  Result Value Ref Range   Sodium 139 135 - 145 mmol/L   Potassium 4.3 3.5 - 5.1 mmol/L   Chloride 108 98 - 111 mmol/L   CO2 28 22 - 32 mmol/L   Glucose, Bld 91 70 - 99 mg/dL   BUN 24 (H) 8 - 23 mg/dL   Creatinine, Ser 0.72 0.44 - 1.00 mg/dL   Calcium 8.9 8.9 - 10.3 mg/dL   Total Protein 7.1 6.5 - 8.1 g/dL   Albumin 3.8 3.5 - 5.0 g/dL   AST 17 15 - 41 U/L   ALT 12 0 - 44 U/L   Alkaline Phosphatase 68 38 - 126 U/L   Total Bilirubin 0.8 0.3 - 1.2 mg/dL   GFR, Estimated >60 >60 mL/min   Anion gap  3 (L) 5 - 15  CBC     Status: Abnormal   Collection Time: 12/22/21  4:18 AM  Result Value Ref Range   WBC 5.0 4.0 - 10.5 K/uL   RBC 2.86 (L) 3.87 - 5.11 MIL/uL   Hemoglobin 9.3 (L) 12.0 - 15.0 g/dL   HCT 27.6 (L) 36.0 - 46.0 %   MCV 96.5 80.0 - 100.0 fL   MCH 32.5 26.0 - 34.0 pg   MCHC 33.7 30.0 - 36.0 g/dL   RDW 14.0 11.5 - 15.5 %   Platelets 42 (L) 150 - 400 K/uL   nRBC 0.0 0.0 - 0.2 %    CT Knee Left Wo Contrast  Result Date: 12/21/2021 CLINICAL DATA:  Painful ambulation. EXAM: CT OF THE LEFT KNEE WITHOUT CONTRAST TECHNIQUE: Multidetector CT imaging of the left knee was performed according to the standard protocol. Multiplanar CT image reconstructions were also generated. RADIATION DOSE REDUCTION: This exam was performed according to the departmental dose-optimization program which includes automated exposure control, adjustment of the mA and/or kV according to patient size and/or use of iterative reconstruction technique. COMPARISON:  Radiographs, same date. FINDINGS: No acute fracture is identified. Age related degenerative changes and osteoporosis. Grossly by CT the cruciate and collateral ligaments are intact. The quadriceps and patellar tendons are intact. No joint effusion or Baker's cyst. Age related vascular calcifications. On the prior MRI the patient had a cystic lesion in the distal femur which is difficult to identify on the CT scan but overall it think this appears stable measuring approximately 4 cm. If patient's symptoms persist I would recommend a MRI for further evaluation. IMPRESSION: 1. No acute fracture. 2. Age related degenerative changes and osteoporosis. 3. Grossly by CT the cruciate and collateral ligaments are intact. 4. If symptoms persist or worsen MRI may be helpful for further evaluation. Electronically Signed   By: Marijo Sanes M.D.   On: 12/21/2021 15:13   US Venous Img Lower  Unilateral Left  Result Date: 12/21/2021 CLINICAL DATA:  A 88 year old female presents  for evaluation of lower extremity calf pain for 1 week, no reported edema. EXAM: LEFT LOWER EXTREMITY VENOUS DOPPLER ULTRASOUND TECHNIQUE: Gray-scale sonography with compression, as well as color and duplex ultrasound, were performed to evaluate the deep venous system(s) from the level of the common femoral vein through the popliteal and proximal calf veins. COMPARISON:  CT of the abdomen and pelvis from January 29, 2021. FINDINGS: VENOUS Normal compressibility of the common femoral, superficial femoral, and popliteal veins, as well as the visualized calf veins. Visualized portions of profunda femoral vein and great saphenous vein unremarkable. No filling defects to suggest DVT on grayscale or color Doppler imaging. Doppler waveforms show normal direction of venous flow, normal respiratory plasticity and response to augmentation. Limited views of the contralateral common femoral vein are unremarkable. OTHER Small lymph node 1.6 x 0.8 x 0.9 cm in the LEFT groin is hypoechoic with compression of the sinus. Limitations: none IMPRESSION: Negative for sonographic evidence of DVT in the LEFT lower extremity. What is likely a reactive type lymph node in the LEFT groin. Imaging features are indeterminate given hypoechoic appearance and thickening of the cortex. Consider follow-up ultrasound evaluation of this area in 6-8 weeks and correlation with any evidence of ongoing inflammation. Electronically Signed   By: Zetta Bills M.D.   On: 12/21/2021 12:29   DG Knee Complete 4 Views Left  Result Date: 12/21/2021 CLINICAL DATA:  Left knee pain.  Recent left knee injection. EXAM: LEFT KNEE - COMPLETE 4+ VIEW COMPARISON:  MRI left knee 10/11/2017 FINDINGS: Negative for acute fracture. Supracondylar fracture noted on the prior MRI peers to have healed. Cyst in the distal femur on MRI not seen on x-ray. No significant degenerative change or effusion. Arterial calcification IMPRESSION: No acute abnormality. Electronically Signed    By: Franchot Gallo M.D.   On: 12/21/2021 12:01    Assessment/Plan:     Principal Problem:   Generalized weakness Active Problems:   Paroxysmal atrial fibrillation (HCC)   Iron deficiency anemia due to chronic blood loss   HTN (hypertension)  Patient has underlying advanced left knee osteoarthritis.  She was given a hyaluronic acid injection in the office last week and the patient has had increased pain.  She is struggling at home given her inability to weight-bear.  Patient will require a skilled nursing facility for safety as I believe she is a significant fall risk.  Patient is failing to thrive at home and may require an assisted living facility long-term.  Given her age she would be a high risk for knee replacement surgery.    [place] , MD 12/22/2021, 1:57 PM

## 2021-12-22 NOTE — Care Management Obs Status (Signed)
[facility] NOTIFICATION   Patient Details  Name: Alicia Moran MRN: [id number] Date of Birth: June 13, 1929   Medicare Observation Status Notification Given:  Yes Reviewed with patient via phone. Copy printed for patient.    [place], LCSW 12/22/2021, 2:26 PM

## 2021-12-22 NOTE — Progress Notes (Addendum)
Progress Note    Alicia Moran  QIO:[id number] DOB: 12/28/1928  DOA: 12/21/2021 PCP: [name], MD      Brief Narrative:    Medical records reviewed and are as summarized below:  Alicia Moran is a 88 y.o. female with medical history significant for stroke, migraine headache, hyperlipidemia, left knee osteoarthritis, who presented to the hospital because of severe left knee pain.  She had viscosupplementation injection on 12/14/2021.  She was scheduled for another injection on 12/22/2021 with Dr. Sandi Raveling.  Since her left knee injection, she has had worsening pain in the left knee.  She comes to the ED because of worsening left knee pain and inability to care for self at home.    Assessment/Plan:   Principal Problem:   Generalized weakness Active Problems:   Paroxysmal atrial fibrillation (HCC)   Iron deficiency anemia due to chronic blood loss   HTN (hypertension)   Arthritis of left knee   Body mass index is 23.15 kg/m.  Advanced left knee arthritis: Analgesics as needed for pain.  Appreciate input from Dr. Martha Clan, orthopedic surgeon.  Paroxysmal atrial fibrillation and history of stroke: Continue aspirin and metoprolol.  He is not on anticoagulation because of advanced age.  Hypertension: Continue metoprolol  Chronic anemia: H&H is stable.  No indication for blood transfusion at this time.  Generalized weakness: PT recommends discharge to SNF.  Follow-up with social worker to assist with disposition.   Diet Order             Diet regular Room service appropriate? Yes; Fluid consistency: Thin  Diet effective now                            Consultants: Orthopedic surgeon  Procedures: None    Medications:    acetaminophen  1,000 mg Oral Once   aspirin EC  81 mg Oral Daily   cholecalciferol  2,000 Units Oral Daily   feeding supplement  237 mL Oral BID BM   heparin  5,000 Units Subcutaneous Q12H   lidocaine  1 patch  Transdermal Q24H   metoprolol succinate  50 mg Oral Daily   multivitamin-lutein  1 capsule Oral Daily   rOPINIRole  0.25 mg Oral QHS   simvastatin  10 mg Oral QHS   sodium chloride flush  3 mL Intravenous Q12H   Continuous Infusions:  sodium chloride       Anti-infectives (From admission, onward)    None              Family Communication/Anticipated D/C date and plan/Code Status   DVT prophylaxis: heparin injection 5,000 Units Start: 12/21/21 2200     Code Status: Full Code  Family Communication: None Disposition Plan: Plan to discharge to SNF   Status is: Observation The patient will require care spanning > 2 midnights and should be moved to inpatient because: Severe left knee pain, generalized weakness, unsafe discharge plan       Subjective:   Interval events noted.  She complains of pain in the left knee.  She also complains of restless legs at night.  She requested something for restless legs.  Objective:    Vitals:   12/21/21 2130 12/22/21 0500 12/22/21 0601 12/22/21 0800  BP: 130/60  104/67 116/77  Pulse:   70 79  Resp:   16 20  Temp:   98 F (36.7 C) 98.5 F (36.9 C)  TempSrc:  SpO2:   98% 95%  Weight:  57.4 kg    Height:       No data found.  No intake or output data in the 24 hours ending 12/22/21 1550 Filed Weights   12/21/21 1123 12/22/21 0500  Weight: 58.2 kg 57.4 kg    Exam:  GEN: NAD SKIN: Warm and dry EYES: No pallor or icterus ENT: MMM CV: RRR PULM: CTA B ABD: soft, ND, NT, +BS CNS: AAO x 3, non focal EXT: No edema or tenderness        Data Reviewed:   I have personally reviewed following labs and imaging studies:  Labs: Labs show the following:   Basic Metabolic Panel: Recent Labs  Lab 12/21/21 1404 12/22/21 0418  NA 137 139  K 4.0 4.3  CL 104 108  CO2 26 28  GLUCOSE 103* 91  BUN 32* 24*  CREATININE 0.81 0.72  CALCIUM 9.3 8.9   GFR Estimated Creatinine Clearance: 37 mL/min (by C-G  formula based on SCr of 0.72 mg/dL). Liver Function Tests: Recent Labs  Lab 12/22/21 0418  AST 17  ALT 12  ALKPHOS 68  BILITOT 0.8  PROT 7.1  ALBUMIN 3.8   No results for input(s): "LIPASE", "AMYLASE" in the last 168 hours. No results for input(s): "AMMONIA" in the last 168 hours. Coagulation profile No results for input(s): "INR", "PROTIME" in the last 168 hours.  CBC: Recent Labs  Lab 12/21/21 1404 12/22/21 0418  WBC 5.2 5.0  NEUTROABS 2.1  --   HGB 8.8* 9.3*  HCT 26.8* 27.6*  MCV 97.1 96.5  PLT 41* 42*   Cardiac Enzymes: No results for input(s): "CKTOTAL", "CKMB", "CKMBINDEX", "TROPONINI" in the last 168 hours. BNP (last 3 results) No results for input(s): "PROBNP" in the last 8760 hours. CBG: No results for input(s): "GLUCAP" in the last 168 hours. D-Dimer: No results for input(s): "DDIMER" in the last 72 hours. Hgb A1c: No results for input(s): "HGBA1C" in the last 72 hours. Lipid Profile: No results for input(s): "CHOL", "HDL", "LDLCALC", "TRIG", "CHOLHDL", "LDLDIRECT" in the last 72 hours. Thyroid function studies: No results for input(s): "TSH", "T4TOTAL", "T3FREE", "THYROIDAB" in the last 72 hours.  Invalid input(s): "FREET3" Anemia work up: No results for input(s): "VITAMINB12", "FOLATE", "FERRITIN", "TIBC", "IRON", "RETICCTPCT" in the last 72 hours. Sepsis Labs: Recent Labs  Lab 12/21/21 1404 12/22/21 0418  WBC 5.2 5.0    Microbiology No results found for this or any previous visit (from the past 240 hour(s)).  Procedures and diagnostic studies:  CT Knee Left Wo Contrast  Result Date: 12/21/2021 CLINICAL DATA:  Painful ambulation. EXAM: CT OF THE LEFT KNEE WITHOUT CONTRAST TECHNIQUE: Multidetector CT imaging of the left knee was performed according to the standard protocol. Multiplanar CT image reconstructions were also generated. RADIATION DOSE REDUCTION: This exam was performed according to the departmental dose-optimization program which  includes automated exposure control, adjustment of the mA and/or kV according to patient size and/or use of iterative reconstruction technique. COMPARISON:  Radiographs, same date. FINDINGS: No acute fracture is identified. Age related degenerative changes and osteoporosis. Grossly by CT the cruciate and collateral ligaments are intact. The quadriceps and patellar tendons are intact. No joint effusion or Baker's cyst. Age related vascular calcifications. On the prior MRI the patient had a cystic lesion in the distal femur which is difficult to identify on the CT scan but overall it think this appears stable measuring approximately 4 cm. If patient's symptoms persist I would recommend a  MRI for further evaluation. IMPRESSION: 1. No acute fracture. 2. Age related degenerative changes and osteoporosis. 3. Grossly by CT the cruciate and collateral ligaments are intact. 4. If symptoms persist or worsen MRI may be helpful for further evaluation. Electronically Signed   By: Rudie Meyer M.D.   On: 12/21/2021 15:13   US Venous Img Lower Unilateral Left  Result Date: 12/21/2021 CLINICAL DATA:  A 88 year old female presents for evaluation of lower extremity calf pain for 1 week, no reported edema. EXAM: LEFT LOWER EXTREMITY VENOUS DOPPLER ULTRASOUND TECHNIQUE: Gray-scale sonography with compression, as well as color and duplex ultrasound, were performed to evaluate the deep venous system(s) from the level of the common femoral vein through the popliteal and proximal calf veins. COMPARISON:  CT of the abdomen and pelvis from January 29, 2021. FINDINGS: VENOUS Normal compressibility of the common femoral, superficial femoral, and popliteal veins, as well as the visualized calf veins. Visualized portions of profunda femoral vein and great saphenous vein unremarkable. No filling defects to suggest DVT on grayscale or color Doppler imaging. Doppler waveforms show normal direction of venous flow, normal respiratory plasticity  and response to augmentation. Limited views of the contralateral common femoral vein are unremarkable. OTHER Small lymph node 1.6 x 0.8 x 0.9 cm in the LEFT groin is hypoechoic with compression of the sinus. Limitations: none IMPRESSION: Negative for sonographic evidence of DVT in the LEFT lower extremity. What is likely a reactive type lymph node in the LEFT groin. Imaging features are indeterminate given hypoechoic appearance and thickening of the cortex. Consider follow-up ultrasound evaluation of this area in 6-8 weeks and correlation with any evidence of ongoing inflammation. Electronically Signed   By: Donzetta Kohut M.D.   On: 12/21/2021 12:29   DG Knee Complete 4 Views Left  Result Date: 12/21/2021 CLINICAL DATA:  Left knee pain.  Recent left knee injection. EXAM: LEFT KNEE - COMPLETE 4+ VIEW COMPARISON:  MRI left knee 10/11/2017 FINDINGS: Negative for acute fracture. Supracondylar fracture noted on the prior MRI peers to have healed. Cyst in the distal femur on MRI not seen on x-ray. No significant degenerative change or effusion. Arterial calcification IMPRESSION: No acute abnormality. Electronically Signed   By: Marlan Palau M.D.   On: 12/21/2021 12:01               LOS: 0 days   Maudy Yonan  [facility]   Pager on [email or web address]. If 7PM-7AM, please contact night-coverage at [email or web address]     12/22/2021, 3:50 PM

## 2021-12-22 NOTE — NC FL2 (Signed)
[place] MEDICAID FL2 LEVEL OF CARE SCREENING TOOL     IDENTIFICATION  Patient Name: Alicia Moran Birthdate: August 14, 1928 Sex: female Admission Date (Current Location): 12/21/2021  [facility] and [place] Number:  Chiropodist and Address:  [facility], [address] [place], [place] [address]      Provider Number: [id number]  Attending Physician Name and Address:  Lurene Shadow, MD  Relative Name and Phone Number:  Katherina Right (Niece)   [phone] [facility]    Current Level of Care: Hospital Recommended Level of Care: Skilled Nursing Facility Prior Approval Number:    Date Approved/Denied:   PASRR Number: [id number]  Discharge Plan:      Current Diagnoses: Patient Active Problem List   Diagnosis Date Noted   Malnutrition of moderate degree 01/30/2021   Colitis due to Clostridium difficile 01/30/2021   Acute pain of right knee    Generalized weakness 01/29/2021   Disorder of bursae of shoulder region 03/15/2020   Cervical spondylosis 06/30/2018   Muscle strain of left scapular region 06/30/2018   Degenerative tear of medial meniscus of left knee 03/12/2018   Rotator cuff tendinitis, right 03/12/2018   Stress fracture of right calcaneus 01/14/2018   Left knee pain 10/23/2017   Stress fracture of left femur with delayed healing 10/23/2017   Osteoarthritis of knee 09/26/2017   Dysuria 01/19/2017   Acute bronchitis 09/19/2016   HTN (hypertension) 09/13/2016   Monoclonal gammopathy of unknown significance (MGUS) 09/11/2016   Fatigue 05/05/2016   Thrombocytopenia (HCC) 05/03/2016   Osteoporosis 03/20/2016   Valvular heart disease 12/23/2015   Constipation 09/04/2015   Perforation of right tympanic membrane 03/28/2015   Mild cerebral atrophy (HCC) 03/02/2015   Neuropathy 03/02/2015   History of falling, presenting hazards to health 07/17/2014   Iron deficiency anemia due to chronic blood loss 07/17/2014   Positive FIT (fecal  immunochemical test) 07/17/2014   Atherosclerotic peripheral vascular disease (HCC) 07/17/2014   H/O: CVA (cerebrovascular accident) 06/14/2014   Subdural hematoma (HCC) 06/12/2014   Adjustment disorder with mixed anxiety and depressed mood 10/31/2013   History of Clostridium difficile colitis 03/05/2013   Intertrochanteric fracture of right hip (HCC) 03/05/2013   Lens replaced by other means 04/25/2012   Senile cataract 04/25/2012   Severe stage glaucoma 12/01/2011   Paroxysmal atrial fibrillation (HCC)    Stroke (HCC)    Migraine headache    High cholesterol     Orientation RESPIRATION BLADDER Height & Weight     Self, Time, Situation, Place  Normal Incontinent, External catheter Weight: 126 lb 8.7 oz (57.4 kg) Height:  5\' 2"  (157.5 cm)  BEHAVIORAL SYMPTOMS/MOOD NEUROLOGICAL BOWEL NUTRITION STATUS      Continent Diet (regular)  AMBULATORY STATUS COMMUNICATION OF NEEDS Skin   Extensive Assist Verbally Normal                       Personal Care Assistance Level of Assistance  Bathing, Feeding, Dressing Bathing Assistance: Maximum assistance Feeding assistance: Limited assistance Dressing Assistance: Maximum assistance     Functional Limitations Info             SPECIAL CARE FACTORS FREQUENCY  PT (By licensed PT), OT (By licensed OT)     PT Frequency: 5 times per week OT Frequency: 5 times per week            Contractures      Additional Factors Info  Code Status, Allergies Code Status Info: full  Allergies Info: Amoxicillin, Erythromycin, Nsaids           Current Medications (12/22/2021):  This is the current hospital active medication list Current Facility-Administered Medications  Medication Dose Route Frequency Provider Last Rate Last Admin   0.9 %  sodium chloride infusion   Intravenous Continuous Gertha Calkin, MD       acetaminophen (TYLENOL) tablet 650 mg  650 mg Oral Q6H PRN Gertha Calkin, MD       Or   acetaminophen (TYLENOL) suppository  650 mg  650 mg Rectal Q6H PRN Gertha Calkin, MD       acetaminophen (TYLENOL) tablet 1,000 mg  1,000 mg Oral Once Gertha Calkin, MD       ALPRAZolam Prudy Feeler) tablet 0.125 mg  0.125 mg Oral BID PRN Gertha Calkin, MD       aspirin EC tablet 81 mg  81 mg Oral Daily Gertha Calkin, MD   81 mg at 12/22/21 0802   cholecalciferol (VITAMIN D3) tablet 2,000 Units  2,000 Units Oral Daily Gertha Calkin, MD   2,000 Units at 12/22/21 0802   famotidine (PEPCID) tablet 10 mg  10 mg Oral Daily PRN Gertha Calkin, MD       feeding supplement (ENSURE ENLIVE / ENSURE PLUS) liquid 237 mL  237 mL Oral BID BM Lurene Shadow, MD   237 mL at 12/22/21 1229   heparin injection 5,000 Units  5,000 Units Subcutaneous Q12H Gertha Calkin, MD   5,000 Units at 12/22/21 0802   HYDROcodone-acetaminophen (NORCO/VICODIN) 5-325 MG per tablet 1 tablet  1 tablet Oral Q4H PRN Gertha Calkin, MD   1 tablet at 12/22/21 0802   lidocaine (LIDODERM) 5 % 1 patch  1 patch Transdermal Q24H Gertha Calkin, MD   1 patch at 12/22/21 0803   metoprolol succinate (TOPROL-XL) 24 hr tablet 50 mg  50 mg Oral Daily Irena Cords V, MD   50 mg at 12/22/21 0802   morphine (PF) 2 MG/ML injection 2 mg  2 mg Intravenous Q4H PRN Gertha Calkin, MD   2 mg at 12/21/21 2241   multivitamin-lutein (OCUVITE-LUTEIN) capsule 1 capsule  1 capsule Oral Daily Irena Cords V, MD   1 capsule at 12/22/21 0802   rOPINIRole (REQUIP) tablet 0.25 mg  0.25 mg Oral QHS Lurene Shadow, MD       simvastatin (ZOCOR) tablet 10 mg  10 mg Oral QHS Irena Cords V, MD       sodium chloride flush (NS) 0.9 % injection 3 mL  3 mL Intravenous Q12H Gertha Calkin, MD   3 mL at 12/22/21 0802     Discharge Medications: Please see discharge summary for a list of discharge medications.  Relevant Imaging Results:  Relevant Lab Results:   Additional Information SS #: [id number]  Tamsyn Owusu E Augie Vane, LCSW

## 2021-12-22 NOTE — Evaluation (Signed)
Occupational Therapy Evaluation Patient Details Name: Alicia Moran MRN: [id number] DOB: April 14, 1929 Today's Date: 12/22/2021   History of Present Illness Pt is a 72 F admitted on 12/21/21 after presenting with c/o L knee pain & inability to care for herself. PMH: R eye blindness, L knee arthritis, arthritis, HLD, stroke   Clinical Impression   Alicia Moran presents with generalized weakness, reduced endurance, impaired balance, and L knee pain. Prior to admission, pt has been living alone in the single-story home that she has lived in for 60+ years, with a ramped entrance. Pt has been IND in bed mobility, dressing, toileting, bathing (using sponge baths), ambulating with a 3-wheeled walker, with no recent falls history. She has PRN daytime assistance for transportation and shopping, and has someone staying overnight with her 7 nights/week. During today's evaluation, pt presents with recent-onset (following medical procedure on 12/15/21) L knee pain and reduced ROM, stating that it has been difficult for her to walk since that time. Pt is able to engage in bed mobility, transfers, toileting with BSC, LB dressing, all with SUPV-Min A; however, she requires encouragement to engage in each of these activities, first stating that she is unable to do these tasks, but is then able to complete them with little to no assistance. Pt is tearful at times during session, saying that she feels lonely since her husband died 7 years ago and then her only child died 5 years ago. She states that she wants to stay in her home but thinks she needs daytime help to do so and does not know how to find such assistance. She has asked the 2 caregivers who stay with her overnight if they would like to also take on daytime hours, but they have declined. Pt states that, based on this, she feels she must move to a nursing home. Pt is not far from her baseline level of fxl mobility, however, and she has an accessible home set-up and good  routines established there. Pt presents as lonely and depressed, and is fearful of falling and being alone. This therapist anticipates that pt's recent decline in fxl mobility could be managed at home with HHOT, HHPT, and a non-clinical caregiver; SNF-level care is not required at this time.    Recommendations for follow up therapy are one component of a multi-disciplinary discharge planning process, led by the attending physician.  Recommendations may be updated based on patient status, additional functional criteria and insurance authorization.   Follow Up Recommendations  Home health OT    Assistance Recommended at Discharge Frequent or constant Supervision/Assistance  Patient can return home with the following A little help with walking and/or transfers;A little help with bathing/dressing/bathroom;Assistance with cooking/housework;Assist for transportation    Functional Status Assessment  Patient has had a recent decline in their functional status and demonstrates the ability to make significant improvements in function in a reasonable and predictable amount of time.  Equipment Recommendations  None recommended by OT    Recommendations for Other Services       Precautions / Restrictions Precautions Precautions: Fall Restrictions Weight Bearing Restrictions: No      Mobility Bed Mobility Overal bed mobility: Needs Assistance Bed Mobility: Supine to Sit     Supine to sit: Supervision     General bed mobility comments: increased time and encouragement, no physical assistance required    Transfers Overall transfer level: Needs assistance Equipment used: Rolling walker (2 wheels) Transfers: Sit to/from Stand, Bed to chair/wheelchair/BSC Sit to Stand: Min  assist Stand pivot transfers: Min assist                Balance Overall balance assessment: Needs assistance Sitting-balance support: Feet supported Sitting balance-Leahy Scale: Good     Standing balance  support: Bilateral upper extremity supported Standing balance-Leahy Scale: Fair Standing balance comment: Pt expresses fear of falling                           ADL either performed or assessed with clinical judgement   ADL Overall ADL's : Needs assistance/impaired                     Lower Body Dressing: Min guard;Modified independent Lower Body Dressing Details (indicate cue type and reason): Mod I for donning socks, Min guard for donning shoes Toilet Transfer: BSC/3in1;Rolling walker (2 wheels);Minimal assistance   Toileting- Clothing Manipulation and Hygiene: Min guard               Vision Baseline Vision/History: 6 Macular Degeneration;3 Glaucoma Additional Comments: legally blind in R eye; impaired vision in L eye     Perception     Praxis      Pertinent Vitals/Pain Pain Assessment Pain Assessment: 0-10 Pain Score: 7  Pain Location: L knee Pain Descriptors / Indicators: Aching, Discomfort Pain Intervention(s): Repositioned, Limited activity within patient's tolerance, Relaxation     Hand Dominance Right   Extremity/Trunk Assessment Upper Extremity Assessment Upper Extremity Assessment: Overall WFL for tasks assessed   Lower Extremity Assessment Lower Extremity Assessment: LLE deficits/detail;RLE deficits/detail RLE Deficits / Details: WFL LLE Deficits / Details: 3-/5 knee extension LLE (lacks ~30 degrees full knee extension 2/2 pain), 2/5 hip flexion in sitting       Communication Communication Communication: HOH   Cognition Arousal/Alertness: Awake/alert Behavior During Therapy: WFL for tasks assessed/performed Overall Cognitive Status: Within Functional Limits for tasks assessed                                 General Comments: depressed mood     General Comments       Exercises Other Exercises Other Exercises: Educ re: POC, role of OT, DC recs, HH options; therapeutic listening   Shoulder Instructions       Home Living Family/patient expects to be discharged to:: Private residence Living Arrangements: Alone Available Help at Discharge: Personal care attendant;Family;Available PRN/intermittently Type of Home: House Home Access: Ramped entrance     Home Layout: One level     Bathroom Shower/Tub: Chief Strategy Officer: Handicapped height     Home Equipment: Grab bars - toilet;Grab bars - tub/shower;Other (comment);Shower seat (3-wheeled walker)   Additional Comments: 3 wheeled walker      Prior Functioning/Environment Prior Level of Function : Needs assist             Mobility Comments: ambulatory with 3 wheeled walker, denies falls since 2014, sits in lift chair but no assistance to get in/out of bed ADLs Comments: Mod I in ADL, receives assistance with driving, shopping, housecleaning, cooking        OT Problem List: Decreased range of motion;Decreased activity tolerance;Pain;Impaired balance (sitting and/or standing)      OT Treatment/Interventions: Self-care/ADL training;Patient/family education;Therapeutic exercise;Balance training;Energy conservation;Therapeutic activities    OT Goals(Current goals can be found in the care plan section) Acute Rehab OT Goals Patient Stated Goal: to go home  OT Goal Formulation: With patient Time For Goal Achievement: 01/05/22 Potential to Achieve Goals: Good ADL Goals Pt Will Perform Toileting - Clothing Manipulation and hygiene: with supervision;sitting/lateral leans Pt/caregiver will Perform Home Exercise Program: Increased ROM;Increased strength (for strength, flexibility, balance, stress mgmt) Additional ADL Goal #1: pt will identify/demonstrate 2+ energy conservation strategies  OT Frequency: Min 2X/week    Co-evaluation              AM-PAC OT "6 Clicks" Daily Activity     Outcome Measure Help from another person eating meals?: None Help from another person taking care of personal grooming?: A  Little Help from another person toileting, which includes using toliet, bedpan, or urinal?: A Little Help from another person bathing (including washing, rinsing, drying)?: A Little Help from another person to put on and taking off regular upper body clothing?: None Help from another person to put on and taking off regular lower body clothing?: A Little 6 Click Score: 20   End of Session Equipment Utilized During Treatment: Rolling walker (2 wheels)  Activity Tolerance: Patient tolerated treatment well Patient left: in chair;with call bell/phone within reach  OT Visit Diagnosis: Unsteadiness on feet (R26.81);Pain;Muscle weakness (generalized) (M62.81) Pain - Right/Left: Left Pain - part of body: Knee                Time: 1610-9604 OT Time Calculation (min): 51 min Charges:  OT General Charges $OT Visit: 1 Visit OT Evaluation $OT Eval Moderate Complexity: 1 Mod OT Treatments $Self Care/Home Management : 23-37 mins Latina Craver, PhD, MS, OTR/L 12/22/21, 1:12 PM

## 2021-12-23 DIAGNOSIS — Z8673 Personal history of transient ischemic attack (TIA), and cerebral infarction without residual deficits: Secondary | ICD-10-CM | POA: Diagnosis not present

## 2021-12-23 DIAGNOSIS — I5032 Chronic diastolic (congestive) heart failure: Secondary | ICD-10-CM | POA: Diagnosis present

## 2021-12-23 DIAGNOSIS — I350 Nonrheumatic aortic (valve) stenosis: Secondary | ICD-10-CM

## 2021-12-23 DIAGNOSIS — I340 Nonrheumatic mitral (valve) insufficiency: Secondary | ICD-10-CM

## 2021-12-23 DIAGNOSIS — I110 Hypertensive heart disease with heart failure: Secondary | ICD-10-CM | POA: Diagnosis present

## 2021-12-23 DIAGNOSIS — M5416 Radiculopathy, lumbar region: Secondary | ICD-10-CM | POA: Diagnosis present

## 2021-12-23 DIAGNOSIS — R531 Weakness: Secondary | ICD-10-CM | POA: Diagnosis not present

## 2021-12-23 DIAGNOSIS — D696 Thrombocytopenia, unspecified: Secondary | ICD-10-CM | POA: Diagnosis present

## 2021-12-23 DIAGNOSIS — Z880 Allergy status to penicillin: Secondary | ICD-10-CM | POA: Diagnosis not present

## 2021-12-23 DIAGNOSIS — Z87891 Personal history of nicotine dependence: Secondary | ICD-10-CM | POA: Diagnosis not present

## 2021-12-23 DIAGNOSIS — G2581 Restless legs syndrome: Secondary | ICD-10-CM | POA: Diagnosis present

## 2021-12-23 DIAGNOSIS — I083 Combined rheumatic disorders of mitral, aortic and tricuspid valves: Secondary | ICD-10-CM | POA: Diagnosis present

## 2021-12-23 DIAGNOSIS — M1712 Unilateral primary osteoarthritis, left knee: Secondary | ICD-10-CM | POA: Diagnosis present

## 2021-12-23 DIAGNOSIS — H5461 Unqualified visual loss, right eye, normal vision left eye: Secondary | ICD-10-CM | POA: Diagnosis present

## 2021-12-23 DIAGNOSIS — H409 Unspecified glaucoma: Secondary | ICD-10-CM | POA: Diagnosis present

## 2021-12-23 DIAGNOSIS — E785 Hyperlipidemia, unspecified: Secondary | ICD-10-CM | POA: Diagnosis present

## 2021-12-23 DIAGNOSIS — Z881 Allergy status to other antibiotic agents status: Secondary | ICD-10-CM | POA: Diagnosis not present

## 2021-12-23 DIAGNOSIS — D500 Iron deficiency anemia secondary to blood loss (chronic): Secondary | ICD-10-CM | POA: Diagnosis present

## 2021-12-23 DIAGNOSIS — H3530 Unspecified macular degeneration: Secondary | ICD-10-CM | POA: Diagnosis present

## 2021-12-23 DIAGNOSIS — R627 Adult failure to thrive: Secondary | ICD-10-CM | POA: Diagnosis present

## 2021-12-23 DIAGNOSIS — Z638 Other specified problems related to primary support group: Secondary | ICD-10-CM | POA: Diagnosis not present

## 2021-12-23 DIAGNOSIS — Z8249 Family history of ischemic heart disease and other diseases of the circulatory system: Secondary | ICD-10-CM | POA: Diagnosis not present

## 2021-12-23 DIAGNOSIS — D472 Monoclonal gammopathy: Secondary | ICD-10-CM | POA: Diagnosis present

## 2021-12-23 DIAGNOSIS — I959 Hypotension, unspecified: Secondary | ICD-10-CM | POA: Diagnosis not present

## 2021-12-23 DIAGNOSIS — I480 Paroxysmal atrial fibrillation: Secondary | ICD-10-CM | POA: Diagnosis present

## 2021-12-23 DIAGNOSIS — Z886 Allergy status to analgesic agent status: Secondary | ICD-10-CM | POA: Diagnosis not present

## 2021-12-23 DIAGNOSIS — M1990 Unspecified osteoarthritis, unspecified site: Secondary | ICD-10-CM | POA: Diagnosis present

## 2021-12-23 DIAGNOSIS — H9190 Unspecified hearing loss, unspecified ear: Secondary | ICD-10-CM | POA: Diagnosis present

## 2021-12-23 LAB — CBC WITH DIFFERENTIAL/PLATELET
Abs Immature Granulocytes: 0.02 10*3/uL (ref 0.00–0.07)
Basophils Absolute: 0 10*3/uL (ref 0.0–0.1)
Basophils Relative: 1 %
Eosinophils Absolute: 0 10*3/uL (ref 0.0–0.5)
Eosinophils Relative: 1 %
HCT: 28.6 % — ABNORMAL LOW (ref 36.0–46.0)
Hemoglobin: 9.4 g/dL — ABNORMAL LOW (ref 12.0–15.0)
Immature Granulocytes: 0 %
Lymphocytes Relative: 56 %
Lymphs Abs: 3.2 10*3/uL (ref 0.7–4.0)
MCH: 31.8 pg (ref 26.0–34.0)
MCHC: 32.9 g/dL (ref 30.0–36.0)
MCV: 96.6 fL (ref 80.0–100.0)
Monocytes Absolute: 0.4 10*3/uL (ref 0.1–1.0)
Monocytes Relative: 7 %
Neutro Abs: 2 10*3/uL (ref 1.7–7.7)
Neutrophils Relative %: 35 %
Platelets: 47 10*3/uL — ABNORMAL LOW (ref 150–400)
RBC: 2.96 MIL/uL — ABNORMAL LOW (ref 3.87–5.11)
RDW: 14 % (ref 11.5–15.5)
WBC: 5.6 10*3/uL (ref 4.0–10.5)
nRBC: 0 % (ref 0.0–0.2)

## 2021-12-23 NOTE — TOC Progression Note (Addendum)
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  She would like to get into [facility] or [facility] She said that [facility] said that they have no openings She wants [place] to see if [facility] can take her as a resident I explained that I will call but it being a weekend we may not be able to get a bed, she stated that her night time person is also trying to help her find a person, I offered to get an agency that is private pay, she declined and said that her people are trying to get a private individual to do it.  [facility] is not able to accept the patient even private pay, I made the patient aware She stated she would consider going to [facility] I reached out to Pisek at [facility] and asked that they consider the patient  for self pay rehab Awaiting response    Expected Discharge Plan: [place] Barriers to Discharge: Continued Medical Work up  Expected Discharge Plan and Services Expected Discharge Plan: [place] arrangements for the past 2 months: Single Family Home                                       Social Determinants of Health (SDOH) Interventions    Readmission Risk Interventions     No data to display

## 2021-12-23 NOTE — Progress Notes (Addendum)
Progress Note    Alicia Moran  FAO:[id number] DOB: 1928/09/06  DOA: 12/21/2021 PCP: [name], MD      Brief Narrative:    Medical records reviewed and are as summarized below:  Alicia Moran is a 88 y.o. female with medical history significant for stroke, paroxysmal atrial fibrillation, history of subdural hematoma in 2015, hypertension, migraine headache, hyperlipidemia, left knee osteoarthritis, chronic diastolic CHF, moderate aortic stenosis, moderate tricuspid regurgitation, moderate mitral regurgitation, MGUS, chronic anemia and thrombocytopenia, who presented to the hospital because of severe left knee pain.  She had viscosupplementation injection on 12/14/2021.  She was scheduled for another injection on 12/22/2021 with Dr. Sandi Raveling.  Since her left knee injection, she has had worsening pain in the left knee.  She came to the ED because of worsening left knee pain and inability to care for self at home.    Assessment/Plan:   Principal Problem:   Generalized weakness Active Problems:   Paroxysmal atrial fibrillation (HCC)   Iron deficiency anemia due to chronic blood loss   Thrombocytopenia (HCC)   Monoclonal gammopathy of unknown significance (MGUS)   H/O: CVA (cerebrovascular accident)   HTN (hypertension)   Arthritis of left knee   Chronic diastolic CHF (congestive heart failure) (HCC)   Moderate mitral regurgitation   Mild aortic stenosis   Body mass index is 23.31 kg/m.  Advanced left knee arthritis: Continue analgesics as needed for pain.  Continue PT and OT.  Appreciate input from Dr. Martha Clan, orthopedic surgeon.  Hypotension: BP was 81/54 this morning but this has improved to 126/72.  No orthostatic hypotension.  Monitor BP closely.  Dizziness:   Paroxysmal atrial fibrillation and history of stroke: Continue aspirin and metoprolol.  He is not on anticoagulation because of advanced age and thrombocytopenia.  History of hypertension: She  prefers to continue metoprolol.  She said it is mainly for rate control of her atrial fibrillation.  Chronic anemia and thrombocytopenia: Last platelet count prior to admission was 83,000.  Her platelet count has dropped into the 40s.  Monitor platelet count closely.  H&H is stable.  MGUS: She last saw Dr. Orlie Dakin, oncologist on 05/23/2021.  No plan for bone marrow biopsy and no plan for treatment even if she progressed to multiple myeloma because of advanced age and poor performance status per Dr. Milinda Cave notes.  Chronic diastolic CHF, mild aortic stenosis, moderate tricuspid regurgitation, moderate mitral regurgitation: Compensated.  2D echo in April 2023 showed EF estimated at 60 to 65%, grade 2 diastolic dysfunction, mild AS, moderate MR and moderate TR.  Generalized weakness: PT recommends discharge to SNF.  Follow-up with social worker to assist with disposition.  Other comorbidities include hearing impairment, visual impairment from macular degeneration and glaucoma   Diet Order             Diet regular Room service appropriate? Yes; Fluid consistency: Thin  Diet effective now                            Consultants: Orthopedic surgeon  Procedures: None    Medications:    acetaminophen  1,000 mg Oral Once   aspirin EC  81 mg Oral Daily   cholecalciferol  2,000 Units Oral Daily   feeding supplement  237 mL Oral BID BM   heparin  5,000 Units Subcutaneous Q12H   lidocaine  1 patch Transdermal Q24H   metoprolol succinate  50 mg Oral  Daily   multivitamin-lutein  1 capsule Oral Daily   rOPINIRole  0.25 mg Oral QHS   simvastatin  10 mg Oral QHS   sodium chloride flush  3 mL Intravenous Q12H   Continuous Infusions:     Anti-infectives (From admission, onward)    None              Family Communication/Anticipated D/C date and plan/Code Status   DVT prophylaxis: heparin injection 5,000 Units Start: 12/21/21 2200     Code Status: Full  Code  Family Communication: None Disposition Plan: Plan to discharge to SNF   Status is: Observation The patient will require care spanning > 2 midnights and should be moved to inpatient because: Severe left knee pain, generalized weakness, unsafe discharge plan       Subjective:   She complains of dizziness, pain in the left knee and restless legs.  She said ropinirole helped her last night and wants it more frequently.  Objective:    Vitals:   12/23/21 0958 12/23/21 1000 12/23/21 1005 12/23/21 1023  BP: (!) 125/57 131/71 126/72   Pulse: 89 96 (!) 108 90  Resp:      Temp:      TempSrc:      SpO2: 95% 97% 95%   Weight:      Height:       No data found.   Intake/Output Summary (Last 24 hours) at 12/23/2021 1223 Last data filed at 12/23/2021 0500 Gross per 24 hour  Intake --  Output 300 ml  Net -300 ml   Filed Weights   12/21/21 1123 12/22/21 0500 12/23/21 0500  Weight: 58.2 kg 57.4 kg 57.8 kg    Exam:  GEN: NAD SKIN: Warm and dry EYES: No pallor or icterus ENT: MMM CV: RRR, systolic murmur loudest in the mitral area PULM: CTA B ABD: soft, ND, NT, +BS CNS: AAO x 3, non focal EXT: Left knee tenderness without erythema        Data Reviewed:   I have personally reviewed following labs and imaging studies:  Labs: Labs show the following:   Basic Metabolic Panel: Recent Labs  Lab 12/21/21 1404 12/22/21 0418  NA 137 139  K 4.0 4.3  CL 104 108  CO2 26 28  GLUCOSE 103* 91  BUN 32* 24*  CREATININE 0.81 0.72  CALCIUM 9.3 8.9   GFR Estimated Creatinine Clearance: 37 mL/min (by C-G formula based on SCr of 0.72 mg/dL). Liver Function Tests: Recent Labs  Lab 12/22/21 0418  AST 17  ALT 12  ALKPHOS 68  BILITOT 0.8  PROT 7.1  ALBUMIN 3.8   No results for input(s): "LIPASE", "AMYLASE" in the last 168 hours. No results for input(s): "AMMONIA" in the last 168 hours. Coagulation profile No results for input(s): "INR", "PROTIME" in the last 168  hours.  CBC: Recent Labs  Lab 12/21/21 1404 12/22/21 0418  WBC 5.2 5.0  NEUTROABS 2.1  --   HGB 8.8* 9.3*  HCT 26.8* 27.6*  MCV 97.1 96.5  PLT 41* 42*   Cardiac Enzymes: No results for input(s): "CKTOTAL", "CKMB", "CKMBINDEX", "TROPONINI" in the last 168 hours. BNP (last 3 results) No results for input(s): "PROBNP" in the last 8760 hours. CBG: No results for input(s): "GLUCAP" in the last 168 hours. D-Dimer: No results for input(s): "DDIMER" in the last 72 hours. Hgb A1c: No results for input(s): "HGBA1C" in the last 72 hours. Lipid Profile: No results for input(s): "CHOL", "HDL", "LDLCALC", "TRIG", "CHOLHDL", "LDLDIRECT"  in the last 72 hours. Thyroid function studies: No results for input(s): "TSH", "T4TOTAL", "T3FREE", "THYROIDAB" in the last 72 hours.  Invalid input(s): "FREET3" Anemia work up: No results for input(s): "VITAMINB12", "FOLATE", "FERRITIN", "TIBC", "IRON", "RETICCTPCT" in the last 72 hours. Sepsis Labs: Recent Labs  Lab 12/21/21 1404 12/22/21 0418  WBC 5.2 5.0    Microbiology No results found for this or any previous visit (from the past 240 hour(s)).  Procedures and diagnostic studies:  CT Knee Left Wo Contrast  Result Date: 12/21/2021 CLINICAL DATA:  Painful ambulation. EXAM: CT OF THE LEFT KNEE WITHOUT CONTRAST TECHNIQUE: Multidetector CT imaging of the left knee was performed according to the standard protocol. Multiplanar CT image reconstructions were also generated. RADIATION DOSE REDUCTION: This exam was performed according to the departmental dose-optimization program which includes automated exposure control, adjustment of the mA and/or kV according to patient size and/or use of iterative reconstruction technique. COMPARISON:  Radiographs, same date. FINDINGS: No acute fracture is identified. Age related degenerative changes and osteoporosis. Grossly by CT the cruciate and collateral ligaments are intact. The quadriceps and patellar tendons are  intact. No joint effusion or Baker's cyst. Age related vascular calcifications. On the prior MRI the patient had a cystic lesion in the distal femur which is difficult to identify on the CT scan but overall it think this appears stable measuring approximately 4 cm. If patient's symptoms persist I would recommend a MRI for further evaluation. IMPRESSION: 1. No acute fracture. 2. Age related degenerative changes and osteoporosis. 3. Grossly by CT the cruciate and collateral ligaments are intact. 4. If symptoms persist or worsen MRI may be helpful for further evaluation. Electronically Signed   By: Rudie Meyer M.D.   On: 12/21/2021 15:13               LOS: 0 days   Alicia Moran  [facility]   Pager on [email or web address]. If 7PM-7AM, please contact night-coverage at [email or web address]     12/23/2021, 12:23 PM

## 2021-12-24 DIAGNOSIS — M1712 Unilateral primary osteoarthritis, left knee: Secondary | ICD-10-CM | POA: Diagnosis not present

## 2021-12-24 DIAGNOSIS — R531 Weakness: Secondary | ICD-10-CM | POA: Diagnosis not present

## 2021-12-24 DIAGNOSIS — I5032 Chronic diastolic (congestive) heart failure: Secondary | ICD-10-CM | POA: Diagnosis not present

## 2021-12-24 DIAGNOSIS — D696 Thrombocytopenia, unspecified: Secondary | ICD-10-CM | POA: Diagnosis not present

## 2021-12-24 LAB — CBC
HCT: 29 % — ABNORMAL LOW (ref 36.0–46.0)
Hemoglobin: 9.5 g/dL — ABNORMAL LOW (ref 12.0–15.0)
MCH: 32.2 pg (ref 26.0–34.0)
MCHC: 32.8 g/dL (ref 30.0–36.0)
MCV: 98.3 fL (ref 80.0–100.0)
Platelets: 43 10*3/uL — ABNORMAL LOW (ref 150–400)
RBC: 2.95 MIL/uL — ABNORMAL LOW (ref 3.87–5.11)
RDW: 14.1 % (ref 11.5–15.5)
WBC: 4.7 10*3/uL (ref 4.0–10.5)
nRBC: 0 % (ref 0.0–0.2)

## 2021-12-24 MED ORDER — ROPINIROLE HCL 0.25 MG PO TABS
0.2500 mg | ORAL_TABLET | Freq: Two times a day (BID) | ORAL | Status: DC
  Administered 2021-12-24 – 2021-12-26 (×5): 0.25 mg via ORAL
  Filled 2021-12-24 (×5): qty 1

## 2021-12-24 NOTE — Progress Notes (Signed)
Progress Note    Alicia Moran  WNU:[id number] DOB: October 12, 1928  DOA: 12/21/2021 PCP: [name], MD      Brief Narrative:    Medical records reviewed and are as summarized below:  Alicia Moran is a 88 y.o. female with medical history significant for stroke, paroxysmal atrial fibrillation, history of subdural hematoma in 2015, hypertension, migraine headache, hyperlipidemia, left knee osteoarthritis, chronic diastolic CHF, moderate aortic stenosis, moderate tricuspid regurgitation, moderate mitral regurgitation, MGUS, chronic anemia and thrombocytopenia, who presented to the hospital because of severe left knee pain.  She had viscosupplementation injection on 12/14/2021.  She was scheduled for another injection on 12/22/2021 with Dr. Sandi Raveling.  Since her left knee injection, she has had worsening pain in the left knee.  She came to the ED because of worsening left knee pain and inability to care for self at home.    Assessment/Plan:   Principal Problem:   Generalized weakness Active Problems:   Paroxysmal atrial fibrillation (HCC)   Iron deficiency anemia due to chronic blood loss   Thrombocytopenia (HCC)   Monoclonal gammopathy of unknown significance (MGUS)   H/O: CVA (cerebrovascular accident)   HTN (hypertension)   Arthritis of left knee   Chronic diastolic CHF (congestive heart failure) (HCC)   Moderate mitral regurgitation   Mild aortic stenosis   Body mass index is 23.71 kg/m.  Advanced left knee arthritis: Continue analgesics as needed for pain.  Continue PT and OT.  She has already been evaluated by Dr. Martha Clan, orthopedic surgeon. Hypotension: Improved  Dizziness: She wants to try and avoid Vicodin tonight to see if it would make any difference.  Paroxysmal atrial fibrillation and history of stroke: Continue aspirin and metoprolol.  She is not on anticoagulation because of advanced age and thrombocytopenia.  History of hypertension: She prefers  to continue metoprolol.  She said it is mainly for rate control of her atrial fibrillation.  Restless leg syndrome: Patient said ropinirole helps at night and wants to have it during the day as well.  Increase ropinirole from 0.25 mg nightly to 0.25 mg twice daily.  Chronic anemia and thrombocytopenia: Last platelet count prior to admission was 83,000.  Platelet count is still in the 40s most recent platelet is 43,000.  No indication for platelet transfusion.  MGUS: She last saw Dr. Orlie Dakin, oncologist on 05/23/2021.  No plan for bone marrow biopsy and no plan for treatment even if she progressed to multiple myeloma because of advanced age and poor performance status per Dr. Milinda Cave notes.  Chronic diastolic CHF, mild aortic stenosis, moderate tricuspid regurgitation, moderate mitral regurgitation: Compensated.  2D echo in April 2023 showed EF estimated at 60 to 65%, grade 2 diastolic dysfunction, mild AS, moderate MR and moderate TR.  Generalized weakness: PT recommends discharge to SNF.  Follow-up with social worker to assist with disposition.  Other comorbidities include hearing impairment, visual impairment from macular degeneration and glaucoma  She has no family support.  Her daughter and husband are deceased.  She has a sister who recently had a stroke.  She said she has no other family.   Diet Order             Diet regular Room service appropriate? Yes; Fluid consistency: Thin  Diet effective now                            Consultants: Orthopedic surgeon  Procedures: None  Medications:    acetaminophen  1,000 mg Oral Once   aspirin EC  81 mg Oral Daily   cholecalciferol  2,000 Units Oral Daily   feeding supplement  237 mL Oral BID BM   heparin  5,000 Units Subcutaneous Q12H   lidocaine  1 patch Transdermal Q24H   metoprolol succinate  50 mg Oral Daily   multivitamin-lutein  1 capsule Oral Daily   rOPINIRole  0.25 mg Oral BID   simvastatin  10 mg  Oral QHS   sodium chloride flush  3 mL Intravenous Q12H   Continuous Infusions:     Anti-infectives (From admission, onward)    None              Family Communication/Anticipated D/C date and plan/Code Status   DVT prophylaxis: heparin injection 5,000 Units Start: 12/21/21 2200     Code Status: Full Code  Family Communication: None Disposition Plan: Plan to discharge to SNF   Status is: Observation The patient will require care spanning > 2 midnights and should be moved to inpatient because: Severe left knee pain, generalized weakness, unsafe discharge plan       Subjective:   She still complains of dizziness.  She is not sure whether it is coming from Vicodin she took last night.  She said she does not want to take the IV morphine again because she thought it made her dizzy as well.  She still has some pain in the left knee.  She is concerned about not finding a nursing home to go to.  Objective:    Vitals:   12/23/21 2055 12/24/21 0500 12/24/21 0546 12/24/21 0819  BP: 100/62  103/75 (!) 129/57  Pulse: 70  77 86  Resp: 17  16 20   Temp: 98 F (36.7 C)  98.8 F (37.1 C) 98.6 F (37 C)  TempSrc: Oral     SpO2: 95%  96% 98%  Weight:  58.8 kg    Height:       No data found.   Intake/Output Summary (Last 24 hours) at 12/24/2021 1211 Last data filed at 12/24/2021 6045 Gross per 24 hour  Intake --  Output 350 ml  Net -350 ml   Filed Weights   12/22/21 0500 12/23/21 0500 12/24/21 0500  Weight: 57.4 kg 57.8 kg 58.8 kg    Exam:  GEN: NAD SKIN: No rash EYES: EOMI ENT: MMM CV: RRR, systolic murmur in the aortic and mitral area PULM: CTA B ABD: soft, ND, NT, +BS CNS: AAO x 3, non focal EXT: Some left knee tenderness without erythema     I have personally reviewed following labs and imaging studies:  Labs: Labs show the following:   Basic Metabolic Panel: Recent Labs  Lab 12/21/21 1404 12/22/21 0418  NA 137 139  K 4.0 4.3  CL 104  108  CO2 26 28  GLUCOSE 103* 91  BUN 32* 24*  CREATININE 0.81 0.72  CALCIUM 9.3 8.9   GFR Estimated Creatinine Clearance: 37 mL/min (by C-G formula based on SCr of 0.72 mg/dL). Liver Function Tests: Recent Labs  Lab 12/22/21 0418  AST 17  ALT 12  ALKPHOS 68  BILITOT 0.8  PROT 7.1  ALBUMIN 3.8   No results for input(s): "LIPASE", "AMYLASE" in the last 168 hours. No results for input(s): "AMMONIA" in the last 168 hours. Coagulation profile No results for input(s): "INR", "PROTIME" in the last 168 hours.  CBC: Recent Labs  Lab 12/21/21 1404 12/22/21 0418 12/23/21 1253  12/24/21 0851  WBC 5.2 5.0 5.6 4.7  NEUTROABS 2.1  --  2.0  --   HGB 8.8* 9.3* 9.4* 9.5*  HCT 26.8* 27.6* 28.6* 29.0*  MCV 97.1 96.5 96.6 98.3  PLT 41* 42* 47* 43*   Cardiac Enzymes: No results for input(s): "CKTOTAL", "CKMB", "CKMBINDEX", "TROPONINI" in the last 168 hours. BNP (last 3 results) No results for input(s): "PROBNP" in the last 8760 hours. CBG: No results for input(s): "GLUCAP" in the last 168 hours. D-Dimer: No results for input(s): "DDIMER" in the last 72 hours. Hgb A1c: No results for input(s): "HGBA1C" in the last 72 hours. Lipid Profile: No results for input(s): "CHOL", "HDL", "LDLCALC", "TRIG", "CHOLHDL", "LDLDIRECT" in the last 72 hours. Thyroid function studies: No results for input(s): "TSH", "T4TOTAL", "T3FREE", "THYROIDAB" in the last 72 hours.  Invalid input(s): "FREET3" Anemia work up: No results for input(s): "VITAMINB12", "FOLATE", "FERRITIN", "TIBC", "IRON", "RETICCTPCT" in the last 72 hours. Sepsis Labs: Recent Labs  Lab 12/21/21 1404 12/22/21 0418 12/23/21 1253 12/24/21 0851  WBC 5.2 5.0 5.6 4.7    Microbiology No results found for this or any previous visit (from the past 240 hour(s)).  Procedures and diagnostic studies:  No results found.             LOS: 1 day   Talisa Petrak  [facility]   Pager on [email or web address]. If 7PM-7AM,  please contact night-coverage at [email or web address]     12/24/2021, 12:11 PM

## 2021-12-25 DIAGNOSIS — M1712 Unilateral primary osteoarthritis, left knee: Secondary | ICD-10-CM | POA: Diagnosis not present

## 2021-12-25 DIAGNOSIS — I5032 Chronic diastolic (congestive) heart failure: Secondary | ICD-10-CM | POA: Diagnosis not present

## 2021-12-25 DIAGNOSIS — R531 Weakness: Secondary | ICD-10-CM | POA: Diagnosis not present

## 2021-12-25 LAB — TYPE AND SCREEN
ABO/RH(D): O POS
Antibody Screen: POSITIVE
Unit division: 0
Unit division: 0

## 2021-12-25 LAB — BPAM RBC
Blood Product Expiration Date: 202308052359
Blood Product Expiration Date: 202308052359
Unit Type and Rh: 5100
Unit Type and Rh: 5100

## 2021-12-25 NOTE — TOC Progression Note (Signed)
Transition of Care [facility]) - Progression Note    Patient Details  Name: Alicia Moran MRN: [id number] Date of Birth: 15-Sep-1928  Transition of Care [facility] CM/SW [place], LCSW Phone Number: 12/25/2021, 1:59 PM  Clinical Narrative:   Per Magda Paganini at [facility], the director of nursing declined pt.    Expected Discharge Plan: [place] Barriers to Discharge: Continued Medical Work up  Expected Discharge Plan and Services Expected Discharge Plan: [place] arrangements for the past 2 months: Single Family Home                                       Social Determinants of Health (SDOH) Interventions    Readmission Risk Interventions     No data to display

## 2021-12-25 NOTE — Progress Notes (Signed)
Occupational Therapy Treatment Patient Details Name: Alicia Moran MRN: [id number] DOB: Nov 07, 1928 Today's Date: 12/25/2021   History of present illness Pt is a 71 F admitted on 12/21/21 after presenting with c/o L knee pain & inability to care for herself. PMH: R eye blindness, L knee arthritis, arthritis, HLD, stroke   OT comments  Pt performs sit<>stand x 3, requires cueing for hand placement on RW, encouragement for attempting limited WB on L LE. Pt is able to perform LB dressing w/ SUPV. Pt endorses severe pain with standing, displays poor standing balance. Requests transfer to [facility], requiring Mod A; pt then able to complete toileting and perihygiene INDly. Pt again expresses to therapist her desire to return home with full-time [facility], if she could find available help and arrange services ASAP. Given pt's poor balance and level of support required for safe transfers, she would need 24/7 assistance (non-clinical) at home. Pt has made inquiries with acquaintances but at this time has been unable to find anyone to provide this level of care in her home, therefore have updated DC recs to SNF.   Recommendations for follow up therapy are one component of a multi-disciplinary discharge planning process, led by the attending physician.  Recommendations may be updated based on patient status, additional functional criteria and insurance authorization.    Follow Up Recommendations  Skilled nursing-short term rehab (<3 hours/day)    Assistance Recommended at Discharge Frequent or constant Supervision/Assistance  Patient can return home with the following  A little help with walking and/or transfers;A little help with bathing/dressing/bathroom;Assistance with cooking/housework;Assist for transportation   Equipment Recommendations  None recommended by OT    Recommendations for Other Services      Precautions / Restrictions Precautions Precautions: Fall Restrictions Weight Bearing Restrictions: No        Mobility Bed Mobility               General bed mobility comments: not tested, pt received & left sitting up in recliner    Transfers Overall transfer level: Needs assistance Equipment used: Rolling walker (2 wheels) Transfers: Sit to/from Stand Sit to Stand: Min assist Stand pivot transfers: Min assist         General transfer comment: STS from recliner with extra time & cuing for safe hand placement     Balance Overall balance assessment: Needs assistance Sitting-balance support: Feet supported Sitting balance-Leahy Scale: Good     Standing balance support: Bilateral upper extremity supported, Reliant on assistive device for balance Standing balance-Leahy Scale: Poor Standing balance comment: Pt expresses fear of falling, pain w/ WBing on LLE                           ADL either performed or assessed with clinical judgement   ADL Overall ADL's : Needs assistance/impaired                         Toilet Transfer: BSC/3in1;Rolling walker (2 wheels);Moderate assistance Toilet Transfer Details (indicate cue type and reason): cueing for hand placement on RW, repositioning feet Toileting- Clothing Manipulation and Hygiene: Minimal assistance;Set up              Extremity/Trunk Assessment Upper Extremity Assessment Upper Extremity Assessment: Overall WFL for tasks assessed   Lower Extremity Assessment Lower Extremity Assessment: Generalized weakness LLE Deficits / Details: 3-/5 knee extension LLE (lacks ~30 degrees full knee extension 2/2 pain), 2/5 hip flexion in sitting  Vision Baseline Vision/History: 6 Macular Degeneration;3 Glaucoma     Perception     Praxis      Cognition Arousal/Alertness: Awake/alert Behavior During Therapy: WFL for tasks assessed/performed Overall Cognitive Status: Within Functional Limits for tasks assessed                                 General Comments: tearful at  moments, stating, "I am not having a good day."        Exercises Other Exercises Other Exercises: Educ re: POC, role of OT, DC recs, HH options, hand placement on RW, weight-shifting techniques    Shoulder Instructions       General Comments      Pertinent Vitals/ Pain       Pain Assessment Pain Score: 8  Faces Pain Scale: Hurts whole lot Pain Location: L knee Pain Descriptors / Indicators: Discomfort, Aching Pain Intervention(s): Utilized relaxation techniques, Repositioned, Limited activity within patient's tolerance  Home Living                                          Prior Functioning/Environment              Frequency  Min 2X/week        Progress Toward Goals  OT Goals(current goals can now be found in the care plan section)  Progress towards OT goals: Progressing toward goals  Acute Rehab OT Goals Patient Stated Goal: to go home OT Goal Formulation: With patient Time For Goal Achievement: 01/05/22 Potential to Achieve Goals: Fair  Plan Discharge plan needs to be updated;Frequency remains appropriate    Co-evaluation                 AM-PAC OT "6 Clicks" Daily Activity     Outcome Measure   Help from another person eating meals?: None Help from another person taking care of personal grooming?: A Little Help from another person toileting, which includes using toliet, bedpan, or urinal?: A Lot Help from another person bathing (including washing, rinsing, drying)?: A Little Help from another person to put on and taking off regular upper body clothing?: None Help from another person to put on and taking off regular lower body clothing?: A Little 6 Click Score: 19    End of Session Equipment Utilized During Treatment: Rolling walker (2 wheels)  OT Visit Diagnosis: Unsteadiness on feet (R26.81);Pain;Muscle weakness (generalized) (M62.81) Pain - Right/Left: Left Pain - part of body: Knee   Activity Tolerance Patient  tolerated treatment well   Patient Left with call bell/phone within reach;Other (comment) (on BSC -- pt requests privacy; NT informed and will check on pt soon)   Nurse Communication Mobility status        Time: 4098-1191 OT Time Calculation (min): 38 min  Charges: OT General Charges $OT Visit: 1 Visit OT Treatments $Self Care/Home Management : 23-37 mins  Latina Craver, PhD, MS, OTR/L 12/25/21, 5:36 PM

## 2021-12-25 NOTE — Progress Notes (Signed)
Progress Note    Alicia Moran  IRJ:[id number] DOB: 1928/09/19  DOA: 12/21/2021 PCP: [name], MD      Brief Narrative:    Medical records reviewed and are as summarized below:  Alicia Moran is a 88 y.o. female with medical history significant for stroke, paroxysmal atrial fibrillation, history of subdural hematoma in 2015, hypertension, migraine headache, hyperlipidemia, left knee osteoarthritis, chronic diastolic CHF, moderate aortic stenosis, moderate tricuspid regurgitation, moderate mitral regurgitation, MGUS, chronic anemia and thrombocytopenia, who presented to the hospital because of severe left knee pain.  She had viscosupplementation injection on 12/14/2021.  She was scheduled for another injection on 12/22/2021 with Dr. Sandi Raveling.  Since her left knee injection, she has had worsening pain in the left knee.  She came to the ED because of worsening left knee pain and inability to care for self at home.    Assessment/Plan:   Principal Problem:   Generalized weakness Active Problems:   Paroxysmal atrial fibrillation (HCC)   Iron deficiency anemia due to chronic blood loss   Thrombocytopenia (HCC)   Monoclonal gammopathy of unknown significance (MGUS)   H/O: CVA (cerebrovascular accident)   HTN (hypertension)   Arthritis of left knee   Chronic diastolic CHF (congestive heart failure) (HCC)   Moderate mitral regurgitation   Mild aortic stenosis   Body mass index is 23.71 kg/m.  Advanced left knee arthritis: Continue analgesics as needed for pain.  Continue PT and OT.  She has already been evaluated by Dr. Martha Clan, orthopedic surgeon. Hypotension: Improved  Dizziness: Improved.  She attributed this to opioid analgesics.  Paroxysmal atrial fibrillation and history of stroke: Continue aspirin and metoprolol.  She is not on anticoagulation because of advanced age and thrombocytopenia.  History of hypertension: She prefers to continue metoprolol.  She  said it is mainly for rate control of her atrial fibrillation.  Restless leg syndrome: Continue ropinirole  Chronic anemia and thrombocytopenia: Last platelet count prior to admission was 83,000.  Platelet count is still in the 40s most recent platelet is 43,000.  No indication for platelet transfusion.  MGUS: She last saw Dr. Orlie Dakin, oncologist on 05/23/2021.  No plan for bone marrow biopsy and no plan for treatment even if she progressed to multiple myeloma because of advanced age and poor performance status per Dr. Milinda Cave notes.  Chronic diastolic CHF, mild aortic stenosis, moderate tricuspid regurgitation, moderate mitral regurgitation: Compensated.  2D echo in April 2023 showed EF estimated at 60 to 65%, grade 2 diastolic dysfunction, mild AS, moderate MR and moderate TR.  Generalized weakness: PT recommends discharge to SNF.  Follow-up with social worker to assist with disposition.  Other comorbidities include hearing impairment, visual impairment from macular degeneration and glaucoma  She has no family support.  Her daughter and husband are deceased.  She has a sister who recently had a stroke.  She said she has no other family.   Diet Order             Diet regular Room service appropriate? Yes; Fluid consistency: Thin  Diet effective now                            Consultants: Orthopedic surgeon  Procedures: None    Medications:    acetaminophen  1,000 mg Oral Once   aspirin EC  81 mg Oral Daily   cholecalciferol  2,000 Units Oral Daily   feeding supplement  237 mL Oral BID BM   heparin  5,000 Units Subcutaneous Q12H   lidocaine  1 patch Transdermal Q24H   metoprolol succinate  50 mg Oral Daily   multivitamin-lutein  1 capsule Oral Daily   rOPINIRole  0.25 mg Oral BID   simvastatin  10 mg Oral QHS   sodium chloride flush  3 mL Intravenous Q12H   Continuous Infusions:     Anti-infectives (From admission, onward)    None               Family Communication/Anticipated D/C date and plan/Code Status   DVT prophylaxis: heparin injection 5,000 Units Start: 12/21/21 2200     Code Status: Full Code  Family Communication: None Disposition Plan: Plan to discharge to SNF   Status is: Inpatient Remains inpatient appropriate because: Awaiting placement to SNF         Subjective:   Interval events noted.  She still has pain in the left knee.  No dizziness.  She says she did not take any morphine or Vicodin last night.  She thinks her dizziness may have come from the opioids.  Objective:    Vitals:   12/24/21 0819 12/24/21 1531 12/24/21 2033 12/25/21 0803  BP: (!) 129/57 (!) 123/59 128/73 131/67  Pulse: 86 88 79 78  Resp: 20 17 16 16   Temp: 98.6 F (37 C) 98.8 F (37.1 C) 98.3 F (36.8 C) 98.5 F (36.9 C)  TempSrc:  Oral Oral Oral  SpO2: 98% 98% 95% 98%  Weight:      Height:       No data found.  No intake or output data in the 24 hours ending 12/25/21 1308  Filed Weights   12/22/21 0500 12/23/21 0500 12/24/21 0500  Weight: 57.4 kg 57.8 kg 58.8 kg    Exam:  GEN: NAD SKIN: Warm and dry EYES: No pallor or icterus ENT: MMM CV: RRR PULM: CTA B ABD: soft, ND, NT, +BS CNS: AAO x 3, non focal EXT: Left knee tenderness without swelling or erythema      I have personally reviewed following labs and imaging studies:  Labs: Labs show the following:   Basic Metabolic Panel: Recent Labs  Lab 12/21/21 1404 12/22/21 0418  NA 137 139  K 4.0 4.3  CL 104 108  CO2 26 28  GLUCOSE 103* 91  BUN 32* 24*  CREATININE 0.81 0.72  CALCIUM 9.3 8.9   GFR Estimated Creatinine Clearance: 37 mL/min (by C-G formula based on SCr of 0.72 mg/dL). Liver Function Tests: Recent Labs  Lab 12/22/21 0418  AST 17  ALT 12  ALKPHOS 68  BILITOT 0.8  PROT 7.1  ALBUMIN 3.8   No results for input(s): "LIPASE", "AMYLASE" in the last 168 hours. No results for input(s): "AMMONIA" in the last  168 hours. Coagulation profile No results for input(s): "INR", "PROTIME" in the last 168 hours.  CBC: Recent Labs  Lab 12/21/21 1404 12/22/21 0418 12/23/21 1253 12/24/21 0851  WBC 5.2 5.0 5.6 4.7  NEUTROABS 2.1  --  2.0  --   HGB 8.8* 9.3* 9.4* 9.5*  HCT 26.8* 27.6* 28.6* 29.0*  MCV 97.1 96.5 96.6 98.3  PLT 41* 42* 47* 43*   Cardiac Enzymes: No results for input(s): "CKTOTAL", "CKMB", "CKMBINDEX", "TROPONINI" in the last 168 hours. BNP (last 3 results) No results for input(s): "PROBNP" in the last 8760 hours. CBG: No results for input(s): "GLUCAP" in the last 168 hours. D-Dimer: No results for input(s): "DDIMER" in  the last 72 hours. Hgb A1c: No results for input(s): "HGBA1C" in the last 72 hours. Lipid Profile: No results for input(s): "CHOL", "HDL", "LDLCALC", "TRIG", "CHOLHDL", "LDLDIRECT" in the last 72 hours. Thyroid function studies: No results for input(s): "TSH", "T4TOTAL", "T3FREE", "THYROIDAB" in the last 72 hours.  Invalid input(s): "FREET3" Anemia work up: No results for input(s): "VITAMINB12", "FOLATE", "FERRITIN", "TIBC", "IRON", "RETICCTPCT" in the last 72 hours. Sepsis Labs: Recent Labs  Lab 12/21/21 1404 12/22/21 0418 12/23/21 1253 12/24/21 0851  WBC 5.2 5.0 5.6 4.7    Microbiology No results found for this or any previous visit (from the past 240 hour(s)).  Procedures and diagnostic studies:  No results found.             LOS: 2 days   Shateria Paternostro  [facility]   Pager on [email or web address]. If 7PM-7AM, please contact night-coverage at [email or web address]     12/25/2021, 1:08 PM

## 2021-12-25 NOTE — TOC Progression Note (Signed)
Transition of Care [facility]) - Progression Note    Patient Details  Name: Alicia Moran MRN: [id number] Date of Birth: May 12, 1929  Transition of Care [facility] CM/SW Contact  Eileen Stanford, LCSW Phone Number: 12/25/2021, 3:07 PM  Clinical Narrative:  Pt will qualify for SNF if she stays the mightnight tonight (7/8,7/9,7/10).  CSW provided bed offers and pt would like to go to [facility]. CSW has notified SNF and they can take pt tomorrow. MD aware as well.     Expected Discharge Plan: [place] Barriers to Discharge: Continued Medical Work up  Expected Discharge Plan and Services Expected Discharge Plan: [place] arrangements for the past 2 months: Single Family Home                                       Social Determinants of Health (SDOH) Interventions    Readmission Risk Interventions     No data to display

## 2021-12-25 NOTE — Progress Notes (Signed)
Physical Therapy Treatment Patient Details Name: Alicia Moran MRN: [id number] DOB: 08-16-1928 Today's Date: 12/25/2021   History of Present Illness Pt is a 27 F admitted on 12/21/21 after presenting with c/o L knee pain & inability to care for herself. PMH: R eye blindness, L knee arthritis, arthritis, HLD, stroke    PT Comments    Pt seen for PT tx with pt agreeable but frustrated re: not having [facility] anymore with PT encouraging/educating pt on need to use [facility] with assistance. Discussed d/c disposition with pt & pt reports she'd rather go to nursing home vs home with hired help as she is "88 y/o and vision is getting worse" & she believes this to be her best decision. Pt also frustrated that her top choices of SNF ([facility] or [facility]) are not available. Pt reports she is sad, tired, & just wants to be with her daughter (who is deceased). Pt is able to complete STS with min assist & ambulate very short distance in room with RW & min assist with significantly impaired gait pattern as noted below. Will continue to follow pt acutely to address strengthening, endurance/activity tolerance, and gait with LRAD.     Recommendations for follow up therapy are one component of a multi-disciplinary discharge planning process, led by the attending physician.  Recommendations may be updated based on patient status, additional functional criteria and insurance authorization.  Follow Up Recommendations  Skilled nursing-short term rehab (<3 hours/day) Can patient physically be transported by private vehicle: No   Assistance Recommended at Discharge Frequent or constant Supervision/Assistance  Patient can return home with the following A little help with walking and/or transfers;A little help with bathing/dressing/bathroom;Assistance with cooking/housework;Direct supervision/assist for medications management;Help with stairs or ramp for entrance;Assist for transportation   Equipment Recommendations   None recommended by PT    Recommendations for Other Services       Precautions / Restrictions Precautions Precautions: Fall Restrictions Weight Bearing Restrictions: No     Mobility  Bed Mobility               General bed mobility comments: not tested, pt received & left sitting up in recliner    Transfers Overall transfer level: Needs assistance Equipment used: Rolling walker (2 wheels) Transfers: Sit to/from Stand Sit to Stand: Min assist           General transfer comment: STS from recliner with extra time & cuing for safe hand placement    Ambulation/Gait Ambulation/Gait assistance: Min assist Gait Distance (Feet):  (3 ft forwards + 3 ft backwards)   Gait Pattern/deviations: Step-to pattern, Decreased step length - right, Decreased step length - left, Decreased stride length, Decreased dorsiflexion - right, Decreased dorsiflexion - left, Decreased weight shift to left Gait velocity: decreased     General Gait Details: Pt ambulates forwards/backwards with RW with step-to pattern but decreased BLE step length with RLE not entirely meeting LLE during stepping. Pt with decreased ability to weight shift & weight bear through LLE.   Stairs             Wheelchair Mobility    Modified Rankin (Stroke Patients Only)       Balance Overall balance assessment: Needs assistance Sitting-balance support: Feet supported Sitting balance-Leahy Scale: Good     Standing balance support: Bilateral upper extremity supported, Reliant on assistive device for balance Standing balance-Leahy Scale: Poor  Cognition Arousal/Alertness: Awake/alert Behavior During Therapy: WFL for tasks assessed/performed Overall Cognitive Status: Within Functional Limits for tasks assessed                                 General Comments: reports being sad, endorses she just wants to be with her deceased daughter, reports she  "doesn't care a thing about therapy"        Exercises      General Comments        Pertinent Vitals/Pain Pain Assessment Pain Assessment: Faces Faces Pain Scale: Hurts whole lot (no pain at rest, increased pain during & after weight bearing) Pain Location: L knee Pain Descriptors / Indicators: Discomfort, Aching Pain Intervention(s): Monitored during session, Repositioned    Home Living                          Prior Function            PT Goals (current goals can now be found in the care plan section) Acute Rehab PT Goals Patient Stated Goal: decreased pain, go to nursing home PT Goal Formulation: With patient Time For Goal Achievement: 01/05/22 Potential to Achieve Goals: Good Progress towards PT goals: Progressing toward goals    Frequency    Min 2X/week      PT Plan Current plan remains appropriate    Co-evaluation              AM-PAC PT "6 Clicks" Mobility   Outcome Measure  Help needed turning from your back to your side while in a flat bed without using bedrails?: None Help needed moving from lying on your back to sitting on the side of a flat bed without using bedrails?: A Little Help needed moving to and from a bed to a chair (including a wheelchair)?: A Little Help needed standing up from a chair using your arms (e.g., wheelchair or bedside chair)?: A Little Help needed to walk in hospital room?: A Lot Help needed climbing 3-5 steps with a railing? : Total 6 Click Score: 16    End of Session Equipment Utilized During Treatment: Gait belt Activity Tolerance: Patient limited by pain Patient left: in chair;with chair alarm set;with call bell/phone within reach (reviewed use of call bell with call bell working appropriately twice at end of session) Nurse Communication: Mobility status PT Visit Diagnosis: Muscle weakness (generalized) (M62.81);Difficulty in walking, not elsewhere classified (R26.2);Pain Pain - Right/Left: Left Pain  - part of body: Knee     Time: 4132-4401 PT Time Calculation (min) (ACUTE ONLY): 11 min  Charges:  $Therapeutic Activity: 8-22 mins                     Aleda Grana, PT, DPT 12/25/21, 11:50 AM    Sandi Mariscal 12/25/2021, 11:48 AM

## 2021-12-26 DIAGNOSIS — M1712 Unilateral primary osteoarthritis, left knee: Secondary | ICD-10-CM | POA: Diagnosis not present

## 2021-12-26 DIAGNOSIS — I5032 Chronic diastolic (congestive) heart failure: Secondary | ICD-10-CM | POA: Diagnosis not present

## 2021-12-26 DIAGNOSIS — R531 Weakness: Secondary | ICD-10-CM | POA: Diagnosis not present

## 2021-12-26 LAB — CBC
HCT: 27.6 % — ABNORMAL LOW (ref 36.0–46.0)
Hemoglobin: 9.1 g/dL — ABNORMAL LOW (ref 12.0–15.0)
MCH: 32 pg (ref 26.0–34.0)
MCHC: 33 g/dL (ref 30.0–36.0)
MCV: 97.2 fL (ref 80.0–100.0)
Platelets: 43 10*3/uL — ABNORMAL LOW (ref 150–400)
RBC: 2.84 MIL/uL — ABNORMAL LOW (ref 3.87–5.11)
RDW: 13.8 % (ref 11.5–15.5)
WBC: 5.3 10*3/uL (ref 4.0–10.5)
nRBC: 0 % (ref 0.0–0.2)

## 2021-12-26 MED ORDER — ROPINIROLE HCL 0.25 MG PO TABS: 0.2500 mg | ORAL_TABLET | Freq: Two times a day (BID) | ORAL | Status: AC

## 2021-12-26 MED ORDER — ALPRAZOLAM 0.25 MG PO TABS: 0.1250 mg | ORAL_TABLET | Freq: Two times a day (BID) | ORAL | 0 refills | Status: AC | PRN

## 2021-12-26 NOTE — Progress Notes (Signed)
  Subjective:  Patient up out of bed to a chair.  Patient reports left knee pain as moderate.  Patient states she is still having difficulty putting any weight on her left lower extremity.  Objective:   VITALS:   Vitals:   12/25/21 1953 12/26/21 0457 12/26/21 0500 12/26/21 0802  BP: 97/63 90/60  (!) 106/58  Pulse: 86 80  89  Resp: '16 18  18  '$ Temp: 98.1 F (36.7 C) 97.7 F (36.5 C)  98.2 F (36.8 C)  TempSrc:      SpO2: 96% 97%  96%  Weight:   58.8 kg   Height:        PHYSICAL EXAM: Left knee:  Patient has intact skin.  There is no erythema ecchymosis or effusion. Neurovascular intact Sensation intact distally Intact pulses distally Dorsiflexion/Plantar flexion intact No cellulitis present Compartment soft  LABS  Results for orders placed or performed during the hospital encounter of 12/21/21 (from the past 24 hour(s))  CBC     Status: Abnormal   Collection Time: 12/26/21  4:50 AM  Result Value Ref Range   WBC 5.3 4.0 - 10.5 K/uL   RBC 2.84 (L) 3.87 - 5.11 MIL/uL   Hemoglobin 9.1 (L) 12.0 - 15.0 g/dL   HCT 27.6 (L) 36.0 - 46.0 %   MCV 97.2 80.0 - 100.0 fL   MCH 32.0 26.0 - 34.0 pg   MCHC 33.0 30.0 - 36.0 g/dL   RDW 13.8 11.5 - 15.5 %   Platelets 43 (L) 150 - 400 K/uL   nRBC 0.0 0.0 - 0.2 %    No results found.  Assessment/Plan:     Principal Problem:   Generalized weakness Active Problems:   Paroxysmal atrial fibrillation (HCC)   Iron deficiency anemia due to chronic blood loss   Thrombocytopenia (HCC)   Monoclonal gammopathy of unknown significance (MGUS)   H/O: CVA (cerebrovascular accident)   HTN (hypertension)   Arthritis of left knee   Chronic diastolic CHF (congestive heart failure) (HCC)   Moderate mitral regurgitation   Mild aortic stenosis  The patient continues to have left knee pain.  She is still having difficulty placing weight on the left lower extremity.  Patient has now qualified for skilled nursing facility.  Patient is ready from  an orthopedic standpoint for discharge to SNF.  She may follow-up with me in 4 weeks in the office for reevaluation.  Patient may benefit from NSAIDs oral or topical or an oral steroid taper to help with her knee pain in the meantime.  Patient may weight-bear as tolerated on the left lower extremity.  I am hopeful her pain and allergies to weight-bear will improve at rehab.  Patient will need to use a walker or even wheelchair based on her pain symptoms.    Thornton Park , MD 12/26/2021, 11:02 AM

## 2021-12-26 NOTE — Discharge Summary (Addendum)
Physician Discharge Summary   Patient: Alicia Moran MRN: [id number] DOB: 05-Dec-1928  Admit date:     12/21/2021  Discharge date: 12/26/21  Discharge Physician: Lurene Shadow   PCP: [name], MD   Recommendations at discharge:   Follow-up with physician at the nursing home within 3 days of discharge  Monitor CBC closely and transfuse platelets or PRBCs as needed.  Discharge Diagnoses: Principal Problem:   Generalized weakness Active Problems:   Paroxysmal atrial fibrillation (HCC)   Iron deficiency anemia due to chronic blood loss   Thrombocytopenia (HCC)   Monoclonal gammopathy of unknown significance (MGUS)   H/O: CVA (cerebrovascular accident)   HTN (hypertension)   Arthritis of left knee   Chronic diastolic CHF (congestive heart failure) (HCC)   Moderate mitral regurgitation   Mild aortic stenosis  Resolved Problems:   * No resolved hospital problems. *  Hospital Course:  Alicia Moran is a 88 y.o. female with medical history significant for stroke, paroxysmal atrial fibrillation, history of subdural hematoma in 2015, hypertension, migraine headache, hyperlipidemia, left knee osteoarthritis, chronic diastolic CHF, moderate aortic stenosis, moderate tricuspid regurgitation, moderate mitral regurgitation, MGUS, chronic anemia and thrombocytopenia, who presented to the hospital because of severe left knee pain.  She had viscosupplementation injection on 12/14/2021.  She was scheduled for another injection on 12/22/2021 with Dr. Sandi Raveling.  Since her left knee injection, she has had worsening pain in the left knee.  She came to the ED because of worsening left knee pain and inability to care for self at home.   She was evaluated by the orthopedic surgeon, Dr. Martha Clan.  Conservative treatment was recommended.  She complained of dizziness after receiving opioid analgesics so this was discontinued.  She was started on ropinirole for restless leg syndrome.  She was  treated with analgesics.  She was evaluated by the physical therapist  and further rehabilitation at SNF was recommended.  She is deemed stable for discharge to SNF today.    Consultants: Orthopedic surgeon Procedures performed: None Disposition: Skilled nursing facility Diet recommendation:  Cardiac diet DISCHARGE MEDICATION: Allergies as of 12/26/2021       Reactions   Amoxicillin Diarrhea   Erythromycin Diarrhea   Nsaids Other (See Comments)   Other Reaction: GI ulcers        Medication List     STOP taking these medications    hydrOXYzine 10 MG tablet Commonly known as: ATARAX   loperamide 2 MG capsule Commonly known as: IMODIUM   methylPREDNISolone 4 MG Tbpk tablet Commonly known as: MEDROL DOSEPAK   ondansetron 4 MG tablet Commonly known as: ZOFRAN       TAKE these medications    acetaminophen 500 MG tablet Commonly known as: TYLENOL Take 500-1,000 mg by mouth every 6 (six) hours as needed (pain).   ALPRAZolam 0.25 MG tablet Commonly known as: XANAX Take 0.5 tablets (0.125 mg total) by mouth 2 (two) times daily as needed for anxiety or sleep.   aspirin 81 MG tablet Take 81 mg by mouth daily.   Cholecalciferol 50 MCG (2000 UT) Caps Take 2,000 Units by mouth daily.   CoQ10 50 MG Caps Take 50 mg by mouth daily.   famotidine 10 MG tablet Commonly known as: PEPCID Take 10 mg by mouth daily as needed for heartburn or indigestion.   glucosamine-chondroitin 500-400 MG tablet Take 1 tablet by mouth as directed.   metoprolol succinate 50 MG 24 hr tablet Commonly known as: TOPROL-XL TAKE 1  TABLET EVERY DAY   PRESERVISION AREDS 2+MULTI VIT PO Take 2 tablets by mouth daily.   rOPINIRole 0.25 MG tablet Commonly known as: REQUIP Take 1 tablet (0.25 mg total) by mouth 2 (two) times daily.   simvastatin 10 MG tablet Commonly known as: ZOCOR TAKE 1 TABLET AT BEDTIME        Contact information for after-discharge care     Destination      [facility] Preferred SNF .   Service: Skilled Nursing Contact information: [address] [place] [address] [phone]                    Discharge Exam: Filed Weights   12/23/21 0500 12/24/21 0500 12/26/21 0500  Weight: 57.8 kg 58.8 kg 58.8 kg   GEN: NAD SKIN: Warm and dry EYES: No pallor or icterus ENT: MMM, hearing impairment CV: RRR PULM: CTA B ABD: soft, ND, NT, +BS CNS: AAO x 3, non focal EXT: Mild left knee tenderness.  No swelling or erythema   Condition at discharge: good  The results of significant diagnostics from this hospitalization (including imaging, microbiology, ancillary and laboratory) are listed below for reference.   Imaging Studies: CT Knee Left Wo Contrast  Result Date: 12/21/2021 CLINICAL DATA:  Painful ambulation. EXAM: CT OF THE LEFT KNEE WITHOUT CONTRAST TECHNIQUE: Multidetector CT imaging of the left knee was performed according to the standard protocol. Multiplanar CT image reconstructions were also generated. RADIATION DOSE REDUCTION: This exam was performed according to the departmental dose-optimization program which includes automated exposure control, adjustment of the mA and/or kV according to patient size and/or use of iterative reconstruction technique. COMPARISON:  Radiographs, same date. FINDINGS: No acute fracture is identified. Age related degenerative changes and osteoporosis. Grossly by CT the cruciate and collateral ligaments are intact. The quadriceps and patellar tendons are intact. No joint effusion or Baker's cyst. Age related vascular calcifications. On the prior MRI the patient had a cystic lesion in the distal femur which is difficult to identify on the CT scan but overall it think this appears stable measuring approximately 4 cm. If patient's symptoms persist I would recommend a MRI for further evaluation. IMPRESSION: 1. No acute fracture. 2. Age related degenerative changes and  osteoporosis. 3. Grossly by CT the cruciate and collateral ligaments are intact. 4. If symptoms persist or worsen MRI may be helpful for further evaluation. Electronically Signed   By: Rudie Meyer M.D.   On: 12/21/2021 15:13   US Venous Img Lower Unilateral Left  Result Date: 12/21/2021 CLINICAL DATA:  A 88 year old female presents for evaluation of lower extremity calf pain for 1 week, no reported edema. EXAM: LEFT LOWER EXTREMITY VENOUS DOPPLER ULTRASOUND TECHNIQUE: Gray-scale sonography with compression, as well as color and duplex ultrasound, were performed to evaluate the deep venous system(s) from the level of the common femoral vein through the popliteal and proximal calf veins. COMPARISON:  CT of the abdomen and pelvis from January 29, 2021. FINDINGS: VENOUS Normal compressibility of the common femoral, superficial femoral, and popliteal veins, as well as the visualized calf veins. Visualized portions of profunda femoral vein and great saphenous vein unremarkable. No filling defects to suggest DVT on grayscale or color Doppler imaging. Doppler waveforms show normal direction of venous flow, normal respiratory plasticity and response to augmentation. Limited views of the contralateral common femoral vein are unremarkable. OTHER Small lymph node 1.6 x 0.8 x 0.9 cm in the LEFT groin is hypoechoic with compression  of the sinus. Limitations: none IMPRESSION: Negative for sonographic evidence of DVT in the LEFT lower extremity. What is likely a reactive type lymph node in the LEFT groin. Imaging features are indeterminate given hypoechoic appearance and thickening of the cortex. Consider follow-up ultrasound evaluation of this area in 6-8 weeks and correlation with any evidence of ongoing inflammation. Electronically Signed   By: Donzetta Kohut M.D.   On: 12/21/2021 12:29   DG Knee Complete 4 Views Left  Result Date: 12/21/2021 CLINICAL DATA:  Left knee pain.  Recent left knee injection. EXAM: LEFT KNEE -  COMPLETE 4+ VIEW COMPARISON:  MRI left knee 10/11/2017 FINDINGS: Negative for acute fracture. Supracondylar fracture noted on the prior MRI peers to have healed. Cyst in the distal femur on MRI not seen on x-ray. No significant degenerative change or effusion. Arterial calcification IMPRESSION: No acute abnormality. Electronically Signed   By: Marlan Palau M.D.   On: 12/21/2021 12:01    Microbiology: Results for orders placed or performed during the hospital encounter of 01/29/21  Resp Panel by RT-PCR (Flu A&B, Covid) Nasopharyngeal Swab     Status: None   Collection Time: 01/29/21  5:37 AM   Specimen: Nasopharyngeal Swab; Nasopharyngeal(NP) swabs in vial transport medium  Result Value Ref Range Status   SARS Coronavirus 2 by RT PCR NEGATIVE NEGATIVE Final    Comment: (NOTE) SARS-CoV-2 target nucleic acids are NOT DETECTED.  The SARS-CoV-2 RNA is generally detectable in upper respiratory specimens during the acute phase of infection. The lowest concentration of SARS-CoV-2 viral copies this assay can detect is 138 copies/mL. A negative result does not preclude SARS-Cov-2 infection and should not be used as the sole basis for treatment or other patient management decisions. A negative result may occur with  improper specimen collection/handling, submission of specimen other than nasopharyngeal swab, presence of viral mutation(s) within the areas targeted by this assay, and inadequate number of viral copies(<138 copies/mL). A negative result must be combined with clinical observations, patient history, and epidemiological information. The expected result is Negative.  Fact Sheet for Patients:  BloggerCourse.com  Fact Sheet for Healthcare Providers:  SeriousBroker.it  This test is no t yet approved or cleared by the [place] FDA and  has been authorized for detection and/or diagnosis of SARS-CoV-2 by FDA under an Emergency Use  Authorization (EUA). This EUA will remain  in effect (meaning this test can be used) for the duration of the COVID-19 declaration under Section 564(b)(1) of the Act, 21 U.S.C.section 360bbb-3(b)(1), unless the authorization is terminated  or revoked sooner.       Influenza A by PCR NEGATIVE NEGATIVE Final   Influenza B by PCR NEGATIVE NEGATIVE Final    Comment: (NOTE) The Xpert Xpress SARS-CoV-2/FLU/RSV plus assay is intended as an aid in the diagnosis of influenza from Nasopharyngeal swab specimens and should not be used as a sole basis for treatment. Nasal washings and aspirates are unacceptable for Xpert Xpress SARS-CoV-2/FLU/RSV testing.  Fact Sheet for Patients: BloggerCourse.com  Fact Sheet for Healthcare Providers: SeriousBroker.it  This test is not yet approved or cleared by the [place] FDA and has been authorized for detection and/or diagnosis of SARS-CoV-2 by FDA under an Emergency Use Authorization (EUA). This EUA will remain in effect (meaning this test can be used) for the duration of the COVID-19 declaration under Section 564(b)(1) of the Act, 21 U.S.C. section 360bbb-3(b)(1), unless the authorization is terminated or revoked.  Performed at [facility], [address]  [place], [place] [address]   Gastrointestinal Panel by PCR , Stool     Status: None   Collection Time: 01/30/21 12:07 AM   Specimen: Stool  Result Value Ref Range Status   Campylobacter species NOT DETECTED NOT DETECTED Final   Plesimonas shigelloides NOT DETECTED NOT DETECTED Final   Salmonella species NOT DETECTED NOT DETECTED Final   Yersinia enterocolitica NOT DETECTED NOT DETECTED Final   Vibrio species NOT DETECTED NOT DETECTED Final   Vibrio cholerae NOT DETECTED NOT DETECTED Final   Enteroaggregative E coli (EAEC) NOT DETECTED NOT DETECTED Final   Enteropathogenic E coli (EPEC) NOT DETECTED NOT DETECTED Final    Enterotoxigenic E coli (ETEC) NOT DETECTED NOT DETECTED Final   Shiga like toxin producing E coli (STEC) NOT DETECTED NOT DETECTED Final   Shigella/Enteroinvasive E coli (EIEC) NOT DETECTED NOT DETECTED Final   Cryptosporidium NOT DETECTED NOT DETECTED Final   Cyclospora cayetanensis NOT DETECTED NOT DETECTED Final   Entamoeba histolytica NOT DETECTED NOT DETECTED Final   Giardia lamblia NOT DETECTED NOT DETECTED Final   Adenovirus F40/41 NOT DETECTED NOT DETECTED Final   Astrovirus NOT DETECTED NOT DETECTED Final   Norovirus GI/GII NOT DETECTED NOT DETECTED Final   Rotavirus A NOT DETECTED NOT DETECTED Final   Sapovirus (I, II, IV, and V) NOT DETECTED NOT DETECTED Final    Comment: Performed at [facility], [address] [place], [place] [address]  C Difficile Quick Screen w PCR reflex     Status: Abnormal   Collection Time: 01/30/21 12:07 AM   Specimen: STOOL  Result Value Ref Range Status   C Diff antigen POSITIVE (A) NEGATIVE Final   C Diff toxin POSITIVE (A) NEGATIVE Final   C Diff interpretation Toxin producing C. difficile detected.  Final    Comment: CRITICAL RESULT CALLED TO, READ BACK BY AND VERIFIED WITH: Wilford Sports AT 0208 ON 01/30/21 BY SS Performed at [facility], [address] [place], [place] [address]   Resp Panel by RT-PCR (Flu A&B, Covid) Nasopharyngeal Swab     Status: None   Collection Time: 02/02/21  9:17 AM   Specimen: Nasopharyngeal Swab; Nasopharyngeal(NP) swabs in vial transport medium  Result Value Ref Range Status   SARS Coronavirus 2 by RT PCR NEGATIVE NEGATIVE Final    Comment: (NOTE) SARS-CoV-2 target nucleic acids are NOT DETECTED.  The SARS-CoV-2 RNA is generally detectable in upper respiratory specimens during the acute phase of infection. The lowest concentration of SARS-CoV-2 viral copies this assay can detect is 138 copies/mL. A negative result does not preclude SARS-Cov-2 infection and should not be used as the sole  basis for treatment or other patient management decisions. A negative result may occur with  improper specimen collection/handling, submission of specimen other than nasopharyngeal swab, presence of viral mutation(s) within the areas targeted by this assay, and inadequate number of viral copies(<138 copies/mL). A negative result must be combined with clinical observations, patient history, and epidemiological information. The expected result is Negative.  Fact Sheet for Patients:  BloggerCourse.com  Fact Sheet for Healthcare Providers:  SeriousBroker.it  This test is no t yet approved or cleared by the [place] FDA and  has been authorized for detection and/or diagnosis of SARS-CoV-2 by FDA under an Emergency Use Authorization (EUA). This EUA will remain  in effect (meaning this test can be used) for the duration of the COVID-19 declaration under Section 564(b)(1) of the Act, 21 U.S.C.section 360bbb-3(b)(1), unless the authorization is terminated  or revoked sooner.       Influenza A by PCR NEGATIVE NEGATIVE Final   Influenza B by PCR NEGATIVE NEGATIVE Final    Comment: (NOTE) The Xpert Xpress SARS-CoV-2/FLU/RSV plus assay is intended as an aid in the diagnosis of influenza from Nasopharyngeal swab specimens and should not be used as a sole basis for treatment. Nasal washings and aspirates are unacceptable for Xpert Xpress SARS-CoV-2/FLU/RSV testing.  Fact Sheet for Patients: BloggerCourse.com  Fact Sheet for Healthcare Providers: SeriousBroker.it  This test is not yet approved or cleared by the [place] FDA and has been authorized for detection and/or diagnosis of SARS-CoV-2 by FDA under an Emergency Use Authorization (EUA). This EUA will remain in effect (meaning this test can be used) for the duration of the COVID-19 declaration under Section 564(b)(1) of the Act,  21 U.S.C. section 360bbb-3(b)(1), unless the authorization is terminated or revoked.  Performed at [facility], [address] [place], [place] [address]     Labs: CBC: Recent Labs  Lab 12/21/21 1404 12/22/21 0418 12/23/21 1253 12/24/21 0851 12/26/21 0450  WBC 5.2 5.0 5.6 4.7 5.3  NEUTROABS 2.1  --  2.0  --   --   HGB 8.8* 9.3* 9.4* 9.5* 9.1*  HCT 26.8* 27.6* 28.6* 29.0* 27.6*  MCV 97.1 96.5 96.6 98.3 97.2  PLT 41* 42* 47* 43* 43*   Basic Metabolic Panel: Recent Labs  Lab 12/21/21 1404 12/22/21 0418  NA 137 139  K 4.0 4.3  CL 104 108  CO2 26 28  GLUCOSE 103* 91  BUN 32* 24*  CREATININE 0.81 0.72  CALCIUM 9.3 8.9   Liver Function Tests: Recent Labs  Lab 12/22/21 0418  AST 17  ALT 12  ALKPHOS 68  BILITOT 0.8  PROT 7.1  ALBUMIN 3.8   CBG: No results for input(s): "GLUCAP" in the last 168 hours.  Discharge time spent: greater than 30 minutes.  Signed: Lurene Shadow, MD [facility] 12/26/2021

## 2021-12-26 NOTE — Progress Notes (Signed)
Pt a/Ox4 upon discharge. Assisted patient with getting dressed. Report called to [facility] at [facility]. Room 601[facility]. EMS to bring patient to [facility].

## 2021-12-26 NOTE — Care Management Important Message (Signed)
Important Message  Patient Details  Name: Alicia Moran MRN: [id number] Date of Birth: 09-Jan-1929   Medicare Important Message Given:  N/A - LOS <3 / Initial given by admissions     Alicia Moran 12/26/2021, 8:15 AM

## 2021-12-26 NOTE — TOC Transition Note (Signed)
Transition of Care [facility]) - CM/SW Discharge Note   Patient Details  Name: Alicia Moran MRN: [id number] Date of Birth: Jan 18, 1929  [facility] CM/SW Contact:  Eileen Stanford, LCSW Phone Number: 12/26/2021, 12:37 PM   Clinical Narrative:   Clinical Social Worker facilitated patient discharge including contacting patient family and facility to confirm patient discharge plans.  Clinical information faxed to facility and family agreeable with plan.  CSW arranged ambulance transport via [facility] to [facility].  RN to call for report prior to discharge.       Final next level of care: Skilled Nursing Facility Barriers to Discharge: No Barriers Identified   Patient Goals and CMS Choice Patient states their goals for this hospitalization and ongoing recovery are:: SNF CMS Medicare.gov Compare Post Acute Care list provided to:: Patient Choice offered to / list presented to : Patient  Discharge Placement              [facility] Patient to be transferred to facility by: [facility] Name of family member notified: pt aware Patient and family notified of of transfer: 12/26/21  Discharge Plan and Services                                     Social Determinants of Health (SDOH) Interventions     Readmission Risk Interventions     No data to display

## 2022-02-17 ENCOUNTER — Emergency Department: Payer: Medicare Other

## 2022-02-17 ENCOUNTER — Observation Stay
Admission: EM | Admit: 2022-02-17 | Discharge: 2022-02-19 | Disposition: A | Discharge status: Home / Self Care | Payer: Medicare Other | Attending: Internal Medicine | Admitting: Internal Medicine

## 2022-02-17 ENCOUNTER — Other Ambulatory Visit: Payer: Self-pay

## 2022-02-17 DIAGNOSIS — Z7982 Long term (current) use of aspirin: Secondary | ICD-10-CM | POA: Insufficient documentation

## 2022-02-17 DIAGNOSIS — R531 Weakness: Principal | ICD-10-CM

## 2022-02-17 DIAGNOSIS — R2689 Other abnormalities of gait and mobility: Secondary | ICD-10-CM | POA: Diagnosis not present

## 2022-02-17 DIAGNOSIS — R197 Diarrhea, unspecified: Secondary | ICD-10-CM | POA: Diagnosis not present

## 2022-02-17 DIAGNOSIS — D61818 Other pancytopenia: Secondary | ICD-10-CM | POA: Diagnosis present

## 2022-02-17 DIAGNOSIS — M6281 Muscle weakness (generalized): Secondary | ICD-10-CM | POA: Insufficient documentation

## 2022-02-17 DIAGNOSIS — Z87891 Personal history of nicotine dependence: Secondary | ICD-10-CM | POA: Diagnosis not present

## 2022-02-17 DIAGNOSIS — I480 Paroxysmal atrial fibrillation: Secondary | ICD-10-CM | POA: Diagnosis present

## 2022-02-17 DIAGNOSIS — R2681 Unsteadiness on feet: Secondary | ICD-10-CM | POA: Diagnosis not present

## 2022-02-17 DIAGNOSIS — Z8673 Personal history of transient ischemic attack (TIA), and cerebral infarction without residual deficits: Secondary | ICD-10-CM | POA: Diagnosis not present

## 2022-02-17 DIAGNOSIS — Z79899 Other long term (current) drug therapy: Secondary | ICD-10-CM | POA: Insufficient documentation

## 2022-02-17 DIAGNOSIS — I10 Essential (primary) hypertension: Secondary | ICD-10-CM | POA: Diagnosis present

## 2022-02-17 DIAGNOSIS — K5900 Constipation, unspecified: Secondary | ICD-10-CM | POA: Diagnosis present

## 2022-02-17 DIAGNOSIS — D472 Monoclonal gammopathy: Secondary | ICD-10-CM | POA: Diagnosis present

## 2022-02-17 DIAGNOSIS — Z8619 Personal history of other infectious and parasitic diseases: Secondary | ICD-10-CM

## 2022-02-17 DIAGNOSIS — I639 Cerebral infarction, unspecified: Secondary | ICD-10-CM | POA: Diagnosis present

## 2022-02-17 LAB — URINALYSIS, ROUTINE W REFLEX MICROSCOPIC
Bilirubin Urine: NEGATIVE
Glucose, UA: NEGATIVE mg/dL
Hgb urine dipstick: NEGATIVE
Ketones, ur: NEGATIVE mg/dL
Nitrite: NEGATIVE
Protein, ur: NEGATIVE mg/dL
Specific Gravity, Urine: 1.017 (ref 1.005–1.030)
pH: 6 (ref 5.0–8.0)

## 2022-02-17 LAB — BASIC METABOLIC PANEL WITH GFR
Anion gap: 7 (ref 5–15)
BUN: 15 mg/dL (ref 8–23)
CO2: 26 mmol/L (ref 22–32)
Calcium: 9.1 mg/dL (ref 8.9–10.3)
Chloride: 101 mmol/L (ref 98–111)
Creatinine, Ser: 0.8 mg/dL (ref 0.44–1.00)
GFR, Estimated: >60 mL/min
Glucose, Bld: 109 mg/dL — ABNORMAL HIGH (ref 70–99)
Potassium: 3.9 mmol/L (ref 3.5–5.1)
Sodium: 134 mmol/L — ABNORMAL LOW (ref 135–145)

## 2022-02-17 LAB — CBC
HCT: 25.7 % — ABNORMAL LOW (ref 36.0–46.0)
Hemoglobin: 8.7 g/dL — ABNORMAL LOW (ref 12.0–15.0)
MCH: 32.8 pg (ref 26.0–34.0)
MCHC: 33.9 g/dL (ref 30.0–36.0)
MCV: 97 fL (ref 80.0–100.0)
Platelets: 54 10*3/uL — ABNORMAL LOW (ref 150–400)
RBC: 2.65 MIL/uL — ABNORMAL LOW (ref 3.87–5.11)
RDW: 13.3 % (ref 11.5–15.5)
WBC: 4.1 10*3/uL (ref 4.0–10.5)
nRBC: 0 % (ref 0.0–0.2)

## 2022-02-17 LAB — TSH: TSH: 3.253 u[IU]/mL (ref 0.350–4.500)

## 2022-02-17 LAB — TROPONIN I (HIGH SENSITIVITY): Troponin I (High Sensitivity): 8 ng/L

## 2022-02-17 MED ORDER — SIMVASTATIN 20 MG PO TABS
10.0000 mg | ORAL_TABLET | Freq: Every day | ORAL | Status: DC
  Administered 2022-02-17 – 2022-02-18 (×2): 10 mg via ORAL
  Filled 2022-02-17 (×2): qty 1

## 2022-02-17 MED ORDER — SODIUM CHLORIDE 0.9 % IV BOLUS
500.0000 mL | Freq: Once | INTRAVENOUS | Status: AC
  Administered 2022-02-17: 500 mL via INTRAVENOUS

## 2022-02-17 MED ORDER — ROPINIROLE HCL 0.25 MG PO TABS
0.2500 mg | ORAL_TABLET | Freq: Two times a day (BID) | ORAL | Status: DC
  Administered 2022-02-17 – 2022-02-19 (×3): 0.25 mg via ORAL
  Filled 2022-02-17 (×4): qty 1

## 2022-02-17 MED ORDER — ALPRAZOLAM 0.25 MG PO TABS
0.1250 mg | ORAL_TABLET | Freq: Two times a day (BID) | ORAL | Status: DC | PRN
  Administered 2022-02-18 – 2022-02-19 (×2): 0.125 mg via ORAL
  Filled 2022-02-17 (×2): qty 1

## 2022-02-17 MED ORDER — ACETAMINOPHEN 500 MG PO TABS: 500.0000 mg | ORAL_TABLET | Freq: Four times a day (QID) | ORAL | Status: DC | PRN

## 2022-02-17 MED ORDER — VITAMIN D 25 MCG (1000 UNIT) PO TABS
2000.0000 [IU] | ORAL_TABLET | Freq: Every day | ORAL | Status: DC
  Administered 2022-02-17 – 2022-02-18 (×2): 2000 [IU] via ORAL
  Filled 2022-02-17 (×3): qty 2

## 2022-02-17 MED ORDER — SODIUM CHLORIDE 0.9 % IV SOLN: INTRAVENOUS | Status: DC

## 2022-02-17 MED ORDER — ONDANSETRON HCL 4 MG/2ML IJ SOLN
4.0000 mg | Freq: Four times a day (QID) | INTRAMUSCULAR | Status: DC | PRN
  Administered 2022-02-17: 4 mg via INTRAVENOUS
  Filled 2022-02-17: qty 2

## 2022-02-17 MED ORDER — IOHEXOL 300 MG/ML  SOLN
100.0000 mL | Freq: Once | INTRAMUSCULAR | Status: AC | PRN
Start: 2022-02-17 — End: 2022-02-17
  Administered 2022-02-17: 100 mL via INTRAVENOUS

## 2022-02-17 MED ORDER — METOPROLOL SUCCINATE ER 50 MG PO TB24
50.0000 mg | ORAL_TABLET | Freq: Every day | ORAL | Status: DC
  Administered 2022-02-17 – 2022-02-19 (×3): 50 mg via ORAL
  Filled 2022-02-17 (×3): qty 1

## 2022-02-17 MED ORDER — OCUVITE-LUTEIN PO CAPS
1.0000 | ORAL_CAPSULE | Freq: Every day | ORAL | Status: DC
  Filled 2022-02-17 (×2): qty 1

## 2022-02-17 MED ORDER — ONDANSETRON HCL 4 MG PO TABS
4.0000 mg | ORAL_TABLET | Freq: Four times a day (QID) | ORAL | Status: DC | PRN
  Administered 2022-02-18: 4 mg via ORAL
  Filled 2022-02-17: qty 1

## 2022-02-17 MED ORDER — ASPIRIN 81 MG PO TBEC
81.0000 mg | DELAYED_RELEASE_TABLET | Freq: Every day | ORAL | Status: DC
  Administered 2022-02-17 – 2022-02-19 (×3): 81 mg via ORAL
  Filled 2022-02-17 (×3): qty 1

## 2022-02-17 MED ORDER — FAMOTIDINE 20 MG PO TABS
10.0000 mg | ORAL_TABLET | Freq: Every day | ORAL | Status: DC | PRN
  Administered 2022-02-17: 10 mg via ORAL
  Filled 2022-02-17: qty 1

## 2022-02-17 NOTE — ED Notes (Signed)
Per pt request, called sister to inform being admitted.

## 2022-02-17 NOTE — ED Triage Notes (Signed)
Pt presents to ED with c/ of weakness for July. Pt states "someone told me I may have c-diff". Pt states some nausea and states she took pepcid and feels better.

## 2022-02-17 NOTE — ED Triage Notes (Signed)
Pt in via EMS from [facility] in [place] with c/o generalized weakness since Thursday. Facility reports pt has possible c-diff. Pt denies pain. 133/90, 97% RA

## 2022-02-17 NOTE — Assessment & Plan Note (Signed)
Chronic. 

## 2022-02-17 NOTE — Assessment & Plan Note (Signed)
Continue metoprolol for blood pressure control

## 2022-02-17 NOTE — ED Provider Notes (Signed)
[facility] Provider Note    Event Date/Time   First MD Initiated Contact with Patient 02/17/22 1243     (approximate)   History   Weakness   HPI  Alicia Moran is a 88 y.o. female with a history of CVA, paroxysmal atrial fibrillation, subdural hemorrhage, hypertension, hyperlipidemia, diastolic CHF, aortic stenosis, MGUS, and anemia who presents with generalized weakness over the last 1 to 2 weeks.  The patient reports nausea, decreased appetite, and minimal p.o. intake over the last several days.  She states she had some diarrhea last week and thought that she likely had C. difficile after a recent hospitalization for a UTI, but the diarrhea has subsided over the last couple of days.  She denies any abdominal pain.  She has no fever or chills.  She has no difficulty breathing.  She feels shaky and unsteady on her feet.    Physical Exam   Triage Vital Signs: ED Triage Vitals  Enc Vitals Group     BP 02/17/22 1121 (!) 129/93     Pulse Rate 02/17/22 1121 79     Resp 02/17/22 1121 20     Temp 02/17/22 1121 98.1 F (36.7 C)     Temp Source 02/17/22 1121 Oral     SpO2 02/17/22 1121 95 %     Weight --      Height --      Head Circumference --      [facility] Flow --      Pain Score 02/17/22 1122 0     Pain Loc --      Pain Edu? --      Excl. in [place]? --     Most recent vital signs: Vitals:   02/17/22 1245 02/17/22 1300  BP:  133/83  Pulse: 70 65  Resp: 19 17  Temp:    SpO2: 100% 100%     General: Alert and oriented, weak hearing but in no acute distress. CV:  Good peripheral perfusion.  Normal heart sounds. Resp:  Normal effort.  Lungs CTAB. Abd:  Soft with mild right lower abdominal tenderness.  No distention.  Other:  Slightly dry mucous membranes.  EOMI.  PERRLA.  Motor intact in all extremities.  No edema.   ED Results / Procedures / Treatments   Labs (all labs ordered are listed, but only abnormal results are displayed) Labs  Reviewed  BASIC METABOLIC PANEL - Abnormal; Notable for the following components:      Result Value   Sodium 134 (*)    Glucose, Bld 109 (*)    All other components within normal limits  CBC - Abnormal; Notable for the following components:   RBC 2.65 (*)    Hemoglobin 8.7 (*)    HCT 25.7 (*)    Platelets 54 (*)    All other components within normal limits  URINALYSIS, ROUTINE W REFLEX MICROSCOPIC - Abnormal; Notable for the following components:   Color, Urine YELLOW (*)    APPearance HAZY (*)    Leukocytes,Ua SMALL (*)    Bacteria, UA RARE (*)    All other components within normal limits  C DIFFICILE QUICK SCREEN W PCR REFLEX    TSH  TROPONIN I (HIGH SENSITIVITY)  TROPONIN I (HIGH SENSITIVITY)     EKG  ED ECG REPORT I, Arta Silence, the attending physician, personally viewed and interpreted this ECG.  Date: 02/17/2022 EKG Time: 1129 Rate: 72 Rhythm: normal sinus rhythm QRS Axis: normal Intervals: normal  ST/T Wave abnormalities: normal Narrative Interpretation: no evidence of acute ischemia    RADIOLOGY  CT abdomen/pelvis: Pending  PROCEDURES:  Critical Care performed: No  Procedures   MEDICATIONS ORDERED IN ED: Medications  sodium chloride 0.9 % bolus 500 mL (500 mLs Intravenous New Bag/Given 02/17/22 1410)  iohexol (OMNIPAQUE) 300 MG/ML solution 100 mL (100 mLs Intravenous Contrast Given 02/17/22 1442)     IMPRESSION / MDM / ASSESSMENT AND PLAN / ED COURSE  I reviewed the triage vital signs and the nursing notes.  88 year old female with PMH as noted above presents with worsening generalized weakness over the last week associated with some diarrhea that seems to have subsided as well as nausea and decreased p.o. intake.  I the past medical records.  Per the hospitalist discharge summary from 7/11, the patient was admitted at that time after presenting with left knee pain.  She was evaluate by orthopedics but was also diagnosed with UTI.  She was  discharged to SNF for rehabilitation.  On exam currently, the vital signs are normal.  Neurologic exam is nonfocal.  Mucous membranes are dry.  The abdomen is soft with mild right-sided discomfort.  Differential diagnosis includes, but is not limited to, dehydration, electrolyte abnormality, other metabolic disturbance, C. difficile, colitis, diverticulitis, UTI, other infection, less likely cardiac or endocrine etiology.  Patient's presentation is most consistent with acute presentation with potential threat to life or bodily function.  We will obtain lab work-up, CT abdomen/pelvis, give fluids, and reassess.  The patient is on the cardiac monitor to evaluate for evidence of arrhythmia and/or significant heart rate changes.   ----------------------------------------- 3:30 PM on 02/17/2022 -----------------------------------------  CT is pending.  I signed the patient out to the oncoming ED physician Dr. [name].  FINAL CLINICAL IMPRESSION(S) / ED DIAGNOSES   Final diagnoses:  Generalized weakness  Diarrhea, unspecified type     Rx / DC Orders   ED Discharge Orders     None        Note:  This document was prepared using Dragon voice recognition software and may include unintentional dictation errors.    Arta Silence, MD 02/17/22 1530

## 2022-02-17 NOTE — Assessment & Plan Note (Signed)
Patient noted to have pancytopenia which is chronic Monitor closely during this hospitalization

## 2022-02-17 NOTE — Assessment & Plan Note (Signed)
Stable Continue aspirin and statins

## 2022-02-17 NOTE — ED Notes (Signed)
See triage note. Pt believes may have c diff, pending test results since 2 days ago. States not eating or drinking much and is nauseous. LBM was Thursday.

## 2022-02-17 NOTE — Assessment & Plan Note (Signed)
Patient presents for evaluation of generalized weakness and is noted to be dehydrated Judicious IV fluid hydration Encourage oral intake We will request PT evaluation

## 2022-02-17 NOTE — H&P (Signed)
History and Physical    Patient: Alicia Moran NWG:[id number] DOB: 06-Nov-1928 DOA: 02/17/2022 DOS: the patient was seen and examined on 02/17/2022 PCP: [name], MD  Patient coming from: ALF/ILF  Chief Complaint:  Chief Complaint  Patient presents with   Weakness   HPI: Alicia Moran is a 88 y.o. female with medical history significant for paroxysmal A-fib, history of CVA, dyslipidemia, hypertension, history of pancytopenia, status post recent hospitalization for severe left knee pain affecting mobility in July, 2023.  Patient was discharged to a skilled nursing facility following that hospitalization and was sent back to the assisted living facility where she resides about a month ago. She notes that she has had a progressive decline since that last hospitalization and is currently wheelchair-bound with minimal ambulation using an assist device. She states that she is usually able to transfer out of the wheelchair and ambulate very short distances with a rolling walker but over the last several days has been very weak and unable to transfer.  On the day of her presentation to the ER she almost fell while trying to transfer out of the wheelchair.  She notes that her appetite has been very poor and she has some nausea but denies having any vomiting.  She had diarrhea several days before she presented to the ER but her last bowel movement was 2 days prior to her admission.  She denies having any fever or chills.  She has had C. difficile diarrhea in the past and was recently treated for UTI at the skilled nursing facility. She denies having any chest pain, no shortness of breath, no headache, no dizziness, no lightheadedness, no leg swelling, no urinary symptoms, no blurred vision no focal deficit. On exam she is noted to have very dry mucous membranes and will be admitted to the hospital for further evaluation.    Review of Systems: As mentioned in the history of present illness. All  other systems reviewed and are negative. Past Medical History:  Diagnosis Date   Hyperlipidemia    Migraine headache    Stroke (HCC)    No past surgical history on file. Social History:  reports that she quit smoking about 35 years ago. She has never used smokeless tobacco. She reports that she does not drink alcohol and does not use drugs.  Allergies  Allergen Reactions   Amoxicillin Diarrhea   Erythromycin Diarrhea   Nsaids Other (See Comments)    Other Reaction: GI ulcers    Family History  Problem Relation Age of Onset   Hypertension Mother    Diabetes Mother    Hypertension Father     Prior to Admission medications   Medication Sig Start Date End Date Taking? Authorizing Provider  acetaminophen (TYLENOL) 500 MG tablet Take 500-1,000 mg by mouth every 6 (six) hours as needed (pain).    [provider]  ALPRAZolam Prudy Feeler) 0.25 MG tablet Take 0.5 tablets (0.125 mg total) by mouth 2 (two) times daily as needed for anxiety or sleep. 12/26/21   Lurene Shadow, MD  aspirin 81 MG tablet Take 81 mg by mouth daily.    [provider]  Cholecalciferol 50 MCG (2000 UT) CAPS Take 2,000 Units by mouth daily.    [provider]  Coenzyme Q10 (COQ10) 50 MG CAPS Take 50 mg by mouth daily.    [provider]  famotidine (PEPCID) 10 MG tablet Take 10 mg by mouth daily as needed for heartburn or indigestion.    [provider]  glucosamine-chondroitin 500-400 MG tablet Take 1 tablet by mouth as directed.    [provider]  metoprolol succinate (TOPROL-XL) 50 MG 24 hr tablet TAKE 1 TABLET EVERY DAY 09/01/21   [name], MD  Multiple Vitamins-Minerals (PRESERVISION AREDS 2+MULTI VIT PO) Take 2 tablets by mouth daily.    [provider]  rOPINIRole (REQUIP) 0.25 MG tablet Take 1 tablet (0.25 mg total) by mouth 2 (two) times daily. 12/26/21   Lurene Shadow, MD  simvastatin (ZOCOR) 10 MG tablet TAKE 1 TABLET AT BEDTIME 12/04/21    [name], MD    Physical Exam: Vitals:   02/17/22 1245 02/17/22 1300 02/17/22 1330 02/17/22 1400  BP:  133/83 132/80 131/82  Pulse: 70 65 68 70  Resp: 19 17 18  (!) 21  Temp:      TempSrc:      SpO2: 100% 100% 100% 99%   Physical Exam Vitals and nursing note reviewed.  Constitutional:      Appearance: Normal appearance.  HENT:     Head: Normocephalic and atraumatic.     Nose: Nose normal.     Mouth/Throat:     Mouth: Mucous membranes are dry.  Eyes:     Conjunctiva/sclera: Conjunctivae normal.  Cardiovascular:     Rate and Rhythm: Normal rate and regular rhythm.  Pulmonary:     Effort: Pulmonary effort is normal.     Breath sounds: Normal breath sounds.  Abdominal:     General: Abdomen is flat. Bowel sounds are normal.     Palpations: Abdomen is soft.  Musculoskeletal:     Cervical back: Normal range of motion and neck supple.  Skin:    General: Skin is warm and dry.  Neurological:     Mental Status: She is alert.     Motor: Weakness present.  Psychiatric:        Mood and Affect: Mood normal.        Behavior: Behavior normal.     Data Reviewed: Relevant notes from primary care and specialist visits, past discharge summaries as available in EHR, including Care Everywhere. Prior diagnostic testing as pertinent to current admission diagnoses Updated medications and problem lists for reconciliation ED course, including vitals, labs, imaging, treatment and response to treatment Triage notes, nursing and pharmacy notes and ED provider's notes Notable results as noted in HPI Labs reviewed.  TSH 3.25, troponin 8, sodium 134, potassium 3.9, chloride 101, bicarb 26, glucose 109, BUN 15, creatinine 0.80, calcium 9.1, white count 4.1, hemoglobin 8.7, hematocrit 25.7, platelet count 54,000 CT scan of abdomen and pelvis shows Nonobstructive right nephrolithiasis. No acute abnormality seen in the abdomen or pelvis. Aortic Atherosclerosis Twelve-lead EKG reviewed by me  shows normal sinus rhythm There are no new results to review at this time.  Assessment and Plan: * Generalized weakness Patient presents for evaluation of generalized weakness and is noted to be dehydrated Judicious IV fluid hydration Encourage oral intake We will request PT evaluation  Pancytopenia (HCC) Patient noted to have pancytopenia which is chronic Monitor closely during this hospitalization  HTN (hypertension) Continue metoprolol for blood pressure control  Monoclonal gammopathy of unknown significance (MGUS) Chronic  Stroke (HCC) Stable Continue aspirin and statins  Paroxysmal atrial fibrillation (HCC) Continue metoprolol for rate control Not on long-term anticoagulation therapy due to thrombocytopenia with increased risk for bleeding      Advance Care Planning:   Code Status: Full Code   Consults: Physical therapy  Family Communication: Greater  than 50% of time was spent discussing patient's condition and plan of care with her at the bedside.  All questions and concerns have been addressed.  She verbalizes understanding and agrees with the plan.  Severity of Illness: The appropriate patient status for this patient is OBSERVATION. Observation status is judged to be reasonable and necessary in order to provide the required intensity of service to ensure the patient's safety. The patient's presenting symptoms, physical exam findings, and initial radiographic and laboratory data in the context of their medical condition is felt to place them at decreased risk for further clinical deterioration. Furthermore, it is anticipated that the patient will be medically stable for discharge from the hospital within 2 midnights of admission.   Author: Lucile Shutters, MD 02/17/2022 4:56 PM  For on call review [email or web address].

## 2022-02-17 NOTE — Assessment & Plan Note (Signed)
Continue metoprolol for rate control Not on long-term anticoagulation therapy due to thrombocytopenia with increased risk for bleeding

## 2022-02-18 ENCOUNTER — Encounter: Payer: Self-pay | Admitting: Internal Medicine

## 2022-02-18 DIAGNOSIS — I159 Secondary hypertension, unspecified: Secondary | ICD-10-CM | POA: Diagnosis not present

## 2022-02-18 DIAGNOSIS — D472 Monoclonal gammopathy: Secondary | ICD-10-CM

## 2022-02-18 DIAGNOSIS — I480 Paroxysmal atrial fibrillation: Secondary | ICD-10-CM | POA: Diagnosis not present

## 2022-02-18 DIAGNOSIS — R531 Weakness: Secondary | ICD-10-CM | POA: Diagnosis not present

## 2022-02-18 LAB — CBC
HCT: 22.8 % — ABNORMAL LOW (ref 36.0–46.0)
Hemoglobin: 7.7 g/dL — ABNORMAL LOW (ref 12.0–15.0)
MCH: 32.5 pg (ref 26.0–34.0)
MCHC: 33.8 g/dL (ref 30.0–36.0)
MCV: 96.2 fL (ref 80.0–100.0)
Platelets: 49 10*3/uL — ABNORMAL LOW (ref 150–400)
RBC: 2.37 MIL/uL — ABNORMAL LOW (ref 3.87–5.11)
RDW: 13.4 % (ref 11.5–15.5)
WBC: 4.1 10*3/uL (ref 4.0–10.5)
nRBC: 0 % (ref 0.0–0.2)

## 2022-02-18 LAB — BASIC METABOLIC PANEL
Anion gap: 4 — ABNORMAL LOW (ref 5–15)
BUN: 12 mg/dL (ref 8–23)
CO2: 27 mmol/L (ref 22–32)
Calcium: 9 mg/dL (ref 8.9–10.3)
Chloride: 105 mmol/L (ref 98–111)
Creatinine, Ser: 0.86 mg/dL (ref 0.44–1.00)
GFR, Estimated: >60 mL/min (ref >60)
Glucose, Bld: 88 mg/dL (ref 70–99)
Potassium: 4 mmol/L (ref 3.5–5.1)
Sodium: 136 mmol/L (ref 135–145)

## 2022-02-18 MED ORDER — ORAL CARE MOUTH RINSE
15.0000 mL | OROMUCOSAL | Status: DC | PRN
Start: 2022-02-18 — End: 2022-02-19

## 2022-02-18 MED ORDER — POLYETHYLENE GLYCOL 3350 17 G PO PACK
17.0000 g | PACK | Freq: Every day | ORAL | Status: DC
  Administered 2022-02-19: 17 g via ORAL
  Filled 2022-02-18 (×2): qty 1

## 2022-02-18 MED ORDER — SENNOSIDES-DOCUSATE SODIUM 8.6-50 MG PO TABS
1.0000 | ORAL_TABLET | Freq: Every day | ORAL | Status: DC | PRN
Start: 2022-02-18 — End: 2022-02-19
  Administered 2022-02-19: 1 via ORAL
  Filled 2022-02-18 (×2): qty 1

## 2022-02-18 NOTE — Hospital Course (Signed)
88 year old female with past medical history of paroxysmal atrial fibrillation, dyslipidemia, hypertension and recent hospitalization for severe left knee pain 2 months ago discharged to skilled nursing facility where she left from the ER 1 month ago.  Since then, patient has had progressive decline and is currently wheelchair-bound with minimal ambulation.  She presented to the emergency room on 9/2 with complaints of diarrhea times several days and weakness.  She was concerned about C.diff having had that in the past and had been recently treated as an outpatient for UTI.  In the emergency room, lab work unremarkable, analysis unremarkable vital signs noted some mild dehydration.  Patient unable to produce a stool sample, but CT of the abdomen pelvis noted no signs of any inflammation or infection.  Patient brought in for observation and started on some gentle IV fluids.  Patient evaluated by physical therapy who recommended skilled nursing.  Because she was not found to have any acute medical problems to treat, so she needed to stay in observation status.  Patient unable to return back to her assisted living facility on 9/3 due to it being a weekend and will attempt to return her back on 9/4 if possible, given that it is a holiday.

## 2022-02-18 NOTE — NC FL2 (Signed)
[place] MEDICAID FL2 LEVEL OF CARE SCREENING TOOL     IDENTIFICATION  Patient Name: Alicia Moran Birthdate: 1929/01/01 Sex: female Admission Date (Current Location): 02/17/2022  [facility] and [place] Number:  Chiropodist and Address:  [facility], [address] [place], [place] [address]      Provider Number: [id number]  Attending Physician Name and Address:  Hollice Espy, MD  Relative Name and Phone Number:  Katherina Right (Niece)   [phone] [facility])    Current Level of Care: Hospital Recommended Level of Care: Skilled Nursing Facility Prior Approval Number:    Date Approved/Denied:   PASRR Number: [id number]  Discharge Plan: SNF    Current Diagnoses: Patient Active Problem List   Diagnosis Date Noted   Pancytopenia (HCC) 02/17/2022   Chronic diastolic CHF (congestive heart failure) (HCC) 12/23/2021   Moderate mitral regurgitation 12/23/2021   Mild aortic stenosis 12/23/2021   Arthritis of left knee 12/22/2021   Malnutrition of moderate degree 01/30/2021   Colitis due to Clostridium difficile 01/30/2021   Acute pain of right knee    Generalized weakness 01/29/2021   Disorder of bursae of shoulder region 03/15/2020   Cervical spondylosis 06/30/2018   Muscle strain of left scapular region 06/30/2018   Degenerative tear of medial meniscus of left knee 03/12/2018   Rotator cuff tendinitis, right 03/12/2018   Stress fracture of right calcaneus 01/14/2018   Left knee pain 10/23/2017   Stress fracture of left femur with delayed healing 10/23/2017   Osteoarthritis of knee 09/26/2017   Dysuria 01/19/2017   Acute bronchitis 09/19/2016   HTN (hypertension) 09/13/2016   Monoclonal gammopathy of unknown significance (MGUS) 09/11/2016   Fatigue 05/05/2016   Thrombocytopenia (HCC) 05/03/2016   Osteoporosis 03/20/2016   Valvular heart disease 12/23/2015   Constipation 09/04/2015   Perforation of right tympanic membrane  03/28/2015   Mild cerebral atrophy (HCC) 03/02/2015   Neuropathy 03/02/2015   History of falling, presenting hazards to health 07/17/2014   Iron deficiency anemia due to chronic blood loss 07/17/2014   Positive FIT (fecal immunochemical test) 07/17/2014   Atherosclerotic peripheral vascular disease (HCC) 07/17/2014   H/O: CVA (cerebrovascular accident) 06/14/2014   Subdural hematoma (HCC) 06/12/2014   Adjustment disorder with mixed anxiety and depressed mood 10/31/2013   History of Clostridium difficile colitis 03/05/2013   Intertrochanteric fracture of right hip (HCC) 03/05/2013   Lens replaced by other means 04/25/2012   Senile cataract 04/25/2012   Severe stage glaucoma 12/01/2011   Paroxysmal atrial fibrillation (HCC)    Stroke (HCC)    Migraine headache    High cholesterol     Orientation RESPIRATION BLADDER Height & Weight     Self, Time, Situation, Place  Normal External catheter Weight: 53.6 kg Height:  5\' 2"  (157.5 cm)  BEHAVIORAL SYMPTOMS/MOOD NEUROLOGICAL BOWEL NUTRITION STATUS      Continent Diet  AMBULATORY STATUS COMMUNICATION OF NEEDS Skin   Extensive Assist Verbally Bruising (bilateral arm/hand)                       Personal Care Assistance Level of Assistance  Bathing, Dressing, Feeding Bathing Assistance: Maximum assistance Feeding assistance: Limited assistance Dressing Assistance: Maximum assistance     Functional Limitations Info  Sight, Hearing Sight Info: Impaired (Blind in Right Eye) Hearing Info: Impaired      SPECIAL CARE FACTORS FREQUENCY  PT (By licensed PT), OT (By licensed OT)     PT Frequency: 5x/week OT  Frequency: 5x/week            Contractures Contractures Info: Not present    Additional Factors Info  Code Status, Allergies Code Status Info: Full Code Allergies Info: Amoxicillin-Diarrhea. Erythromycin--Diarrhea. NSAIDS--GI ulcer. Please check chart for  any updates.           Current Medications (02/18/2022):   This is the current hospital active medication list Current Facility-Administered Medications  Medication Dose Route Frequency Provider Last Rate Last Admin   acetaminophen (TYLENOL) tablet 500-1,000 mg  500-1,000 mg Oral Q6H PRN Agbata, Tochukwu, MD       ALPRAZolam Prudy Feeler) tablet 0.125 mg  0.125 mg Oral BID PRN Agbata, Tochukwu, MD   0.125 mg at 02/18/22 2035   aspirin EC tablet 81 mg  81 mg Oral Daily Agbata, Tochukwu, MD   81 mg at 02/18/22 1004   cholecalciferol (VITAMIN D3) 25 MCG (1000 UNIT) tablet 2,000 Units  2,000 Units Oral Daily Agbata, Tochukwu, MD   2,000 Units at 02/18/22 1004   famotidine (PEPCID) tablet 10 mg  10 mg Oral Daily PRN Agbata, Tochukwu, MD   10 mg at 02/17/22 2035   metoprolol succinate (TOPROL-XL) 24 hr tablet 50 mg  50 mg Oral Daily Agbata, Tochukwu, MD   50 mg at 02/18/22 1004   multivitamin-lutein (OCUVITE-LUTEIN) capsule 1 capsule  1 capsule Oral Daily Agbata, Tochukwu, MD       ondansetron (ZOFRAN) tablet 4 mg  4 mg Oral Q6H PRN Agbata, Tochukwu, MD   4 mg at 02/18/22 1004   Or   ondansetron (ZOFRAN) injection 4 mg  4 mg Intravenous Q6H PRN Agbata, Tochukwu, MD   4 mg at 02/17/22 2028   Oral care mouth rinse  15 mL Mouth Rinse PRN Agbata, Tochukwu, MD       polyethylene glycol (MIRALAX / GLYCOLAX) packet 17 g  17 g Oral Daily Hollice Espy, MD       rOPINIRole (REQUIP) tablet 0.25 mg  0.25 mg Oral BID Agbata, Tochukwu, MD   0.25 mg at 02/18/22 2032   senna-docusate (Senokot-S) tablet 1 tablet  1 tablet Oral Daily PRN Hollice Espy, MD       simvastatin (ZOCOR) tablet 10 mg  10 mg Oral QHS Agbata, Tochukwu, MD   10 mg at 02/18/22 2032     Discharge Medications: Please see discharge summary for a list of discharge medications.  Relevant Imaging Results:  Relevant Lab Results:   Additional Information SS #: [id number]  Bing Quarry, RN

## 2022-02-18 NOTE — Evaluation (Signed)
Physical Therapy Evaluation Patient Details Name: Alicia Moran MRN: [id number] DOB: 1929/04/28 Today's Date: 02/18/2022  History of Present Illness  Pt is a 88 y/o female admitted secondary to weakness and found to be dehydrated. PMH including but not limited to CVA, HTN and migraines.   Clinical Impression  Pt presented supine in bed with HOB elevated, awake and willing to participate in therapy session. Prior to admission, pt reported that when she was recently discharged from SNF back to her ALF in July, she has been using a w/c as her primary means of mobility. She reported that prior to her stay at SNF she was ambulatory with use of a rollator. Pt lives in an AFL but reported that they typically only assist her with IADLs (I.e. - driving, cooking, etc.). She reported independence with ADLs. At the time of evaluation, pt significantly limited with mobility secondary to fatigue and weakness. She was able to complete bed mobility with supervision, transfers with min guard and use of RW. Pt was only able to tolerate taking side steps at EOB for a very short distance (~2') prior to fatiguing and needing to sit back down on EOB. Pt would continue to benefit from skilled physical therapy services at this time while admitted and after d/c to address the below listed limitations in order to improve overall safety and independence with functional mobility.      Recommendations for follow up therapy are one component of a multi-disciplinary discharge planning process, led by the attending physician.  Recommendations may be updated based on patient status, additional functional criteria and insurance authorization.  Follow Up Recommendations Skilled nursing-short term rehab (<3 hours/day) Can patient physically be transported by private vehicle: No    Assistance Recommended at Discharge Frequent or constant Supervision/Assistance  Patient can return home with the following  A lot of help with  walking and/or transfers;A little help with bathing/dressing/bathroom;Assistance with cooking/housework;Assist for transportation;Help with stairs or ramp for entrance    Equipment Recommendations Other (comment) (defer to next venue of care)  Recommendations for Other Services       Functional Status Assessment Patient has had a recent decline in their functional status and demonstrates the ability to make significant improvements in function in a reasonable and predictable amount of time.     Precautions / Restrictions Precautions Precautions: Fall Restrictions Weight Bearing Restrictions: No      Mobility  Bed Mobility Overal bed mobility: Needs Assistance Bed Mobility: Supine to Sit, Sit to Supine     Supine to sit: Supervision Sit to supine: Supervision   General bed mobility comments: increased time and effort    Transfers Overall transfer level: Needs assistance Equipment used: Rolling walker (2 wheels) Transfers: Sit to/from Stand Sit to Stand: Min guard           General transfer comment: cueing for safe hand placement, min guard for safety; slow, steady transitional movements    Ambulation/Gait Ambulation/Gait assistance: Min guard   Assistive device: Rolling walker (2 wheels)         General Gait Details: pt only able to tolerate taking 4-5 side steps at EOB towards HOB, towards her left side with min guard and RW; limited secondary to significant fatigue  Stairs            Wheelchair Mobility    Modified Rankin (Stroke Patients Only)       Balance Overall balance assessment: Needs assistance Sitting-balance support: Feet supported Sitting balance-Leahy Scale: Fair  Standing balance support: Bilateral upper extremity supported Standing balance-Leahy Scale: Poor                               Pertinent Vitals/Pain Pain Assessment Pain Assessment: No/denies pain    Home Living Family/patient expects to be  discharged to:: Assisted living                 Home Equipment: Wheelchair - manual;Rollator (4 wheels);Shower seat      Prior Function Prior Level of Function : Needs assist             Mobility Comments: since returning to her ALF from SNF in July (2023) she has been using a w/c primarily for mobility. Prior to that, pt was able to ambulate with use of a rollator ADLs Comments: Mod I in ADL, receives assistance with driving, shopping, housecleaning, cooking     Hand Dominance        Extremity/Trunk Assessment   Upper Extremity Assessment Upper Extremity Assessment: Defer to OT evaluation;Generalized weakness    Lower Extremity Assessment Lower Extremity Assessment: Generalized weakness    Cervical / Trunk Assessment Cervical / Trunk Assessment: Kyphotic  Communication   Communication: HOH  Cognition Arousal/Alertness: Awake/alert Behavior During Therapy: WFL for tasks assessed/performed Overall Cognitive Status: Impaired/Different from baseline Area of Impairment: Problem solving                             Problem Solving: Difficulty sequencing, Requires tactile cues, Requires verbal cues          General Comments      Exercises     Assessment/Plan    PT Assessment Patient needs continued PT services  PT Problem List Decreased strength;Decreased activity tolerance;Decreased balance;Decreased mobility;Decreased coordination;Decreased safety awareness       PT Treatment Interventions DME instruction;Gait training;Stair training;Functional mobility training;Therapeutic activities;Therapeutic exercise;Balance training;Neuromuscular re-education;Patient/family education    PT Goals (Current goals can be found in the Care Plan section)  Acute Rehab PT Goals Patient Stated Goal: pt feels that she needs a higher level of care than what she is able to receive at her ALF, would really like to move to [facility] PT Goal Formulation: With  patient Time For Goal Achievement: 03/04/22 Potential to Achieve Goals: Good    Frequency Min 2X/week     Co-evaluation               AM-PAC PT "6 Clicks" Mobility  Outcome Measure Help needed turning from your back to your side while in a flat bed without using bedrails?: A Little Help needed moving from lying on your back to sitting on the side of a flat bed without using bedrails?: A Little Help needed moving to and from a bed to a chair (including a wheelchair)?: A Little Help needed standing up from a chair using your arms (e.g., wheelchair or bedside chair)?: A Little Help needed to walk in hospital room?: A Lot Help needed climbing 3-5 steps with a railing? : Total 6 Click Score: 15    End of Session   Activity Tolerance: Patient limited by fatigue Patient left: in bed;with call bell/phone within reach;with bed alarm set;with SCD's reapplied Nurse Communication: Mobility status PT Visit Diagnosis: Other abnormalities of gait and mobility (R26.89);Muscle weakness (generalized) (M62.81)    Time: 9147-8295 PT Time Calculation (min) (ACUTE ONLY): 23 min   Charges:  PT Evaluation $PT Eval Moderate Complexity: 1 Mod PT Treatments $Therapeutic Activity: 8-22 mins        Arletta Bale, DPT  Acute Rehabilitation Services Office [phone]   Alessandra Bevels Tanis Hensarling 02/18/2022, 10:41 AM

## 2022-02-18 NOTE — TOC Progression Note (Signed)
Transition of Care [facility]) - Progression Note    Patient Details  Name: Alicia Moran MRN: [id number] Date of Birth: August 19, 1928  Transition of Care [facility]) CM/SW Contact  Izola Price, RN Phone Number: 02/18/2022, 5:03 PM  Clinical Narrative: 9/3: Spoke with patient regarding discharge plan. From [facility], but they cannot take patient back till Monday at earliest per [facility]. PT recommended STR/SNF, but patient in OBS with Medicare and does not meet criteria per providers for inpatient status. Discussed with patient and she wants to start a bed request at [facility] but say she has to go back to [facility] to give two weeks notice. Discussed her ability to care for herself and she acknowledged decline in health and hope for an intermediate care placement after STR at [facility] if possible. Was in [place] in July 2023 and does not want to go back there.Only living relative is Niece, which is also POA. 507 pm. Simmie Davies RN CM          Expected Discharge Plan and Services                                                 Social Determinants of Health (SDOH) Interventions    Readmission Risk Interventions     No data to display

## 2022-02-18 NOTE — Progress Notes (Signed)
[facility] Progress Note  Patient: Alicia Moran    CHE:[id number]  DOA: 02/17/2022    Date of Service: the patient was seen and examined on 02/18/2022  Brief hospital course: 88 year old female with past medical history of paroxysmal atrial fibrillation, dyslipidemia, hypertension and recent hospitalization for severe left knee pain 2 months ago discharged to skilled nursing facility where she left from the ER 1 month ago.  Since then, patient has had progressive decline and is currently wheelchair-bound with minimal ambulation.  She presented to the emergency room on 9/2 with complaints of diarrhea times several days and weakness.  She was concerned about C.diff having had that in the past and had been recently treated as an outpatient for UTI.  In the emergency room, lab work unremarkable, analysis unremarkable vital signs noted some mild dehydration.  Patient unable to produce a stool sample, but CT of the abdomen pelvis noted no signs of any inflammation or infection.  Patient brought in for observation and started on some gentle IV fluids.  Patient evaluated by physical therapy who recommended skilled nursing.  Because she was not found to have any acute medical problems to treat, so she needed to stay in observation status.  Patient unable to return back to her assisted living facility on 9/3 due to it being a weekend and will attempt to return her back on 9/4 if possible, given that it is a holiday.  Assessment and Plan: Assessment and Plan: * Generalized weakness Noted to be dehydrated.  Seen by PT recommending skilled nursing.  Looks to be just from overall deconditioning.  Had conversation with patient and recommended that she likely needs to upgrade from assisted living to more higher level of care which she does agree.  Case management assisting with this.  Pancytopenia ([place]) Patient noted to have pancytopenia which is chronic Monitor closely during this hospitalization  HTN  (hypertension) Continue metoprolol for blood pressure control  Monoclonal gammopathy of unknown significance (MGUS) Chronic  History of Clostridium difficile colitis No evidence of C. difficile during this hospitalization.  Patient unable to make any stool.  No elevated white blood cell count or fever.  CT of abdomen pelvis unremarkable.  Stroke ([place]) Stable Continue aspirin and statins  Paroxysmal atrial fibrillation (HCC) Continue metoprolol for rate control Not on long-term anticoagulation therapy due to thrombocytopenia with increased risk for bleeding       Body mass index is 21.61 kg/m.        Consultants: None  Procedures: None none  Antimicrobials: None  Code Status: Full code   Subjective: Patient doing okay, complains of some generalized weakness and not much appetite  Objective: Vital signs were reviewed and unremarkable. Vitals:   02/18/22 0908 02/18/22 1649  BP: (!) 143/61 121/76  Pulse: 70 65  Resp: 16 18  Temp: 97.9 F (36.6 C) 98 F (36.7 C)  SpO2: 96% 97%    Intake/Output Summary (Last 24 hours) at 02/18/2022 1727 Last data filed at 02/17/2022 2300 Gross per 24 hour  Intake 295 ml  Output --  Net 295 ml   Filed Weights   02/17/22 1900  Weight: 53.6 kg   Body mass index is 21.61 kg/m.  Exam:  General: Alert and oriented x2, no acute distress HEENT: Normocephalic, atraumatic, mucous membranes slightly dry.  Patient is almost fully blind from macular degeneration and glaucoma Cardiovascular: Regular rate and rhythm, S1-S2 Respiratory: Clear to auscultation bilaterally Abdomen: Soft, nontender, nondistended, positive bowel sounds no Musculoskeletal: No cyanosis  or edema Skin: No skin breaks, tears or lesions Psychiatry: Appropriate, no evidence of psychoses Neurology: No focal deficits  Data Reviewed: Stable labs  Disposition:  Status is: Observation     Anticipated discharge date: 9/4  Remaining issues to be resolved  so that patient can be discharged: Acceptance back to assisted living facility   Family Communication: Left message for niece DVT Prophylaxis: SCDs Start: 02/17/22 1643    Author: Annita Brod ,MD 02/18/2022 5:27 PM  To reach On-call, see care teams to locate the attending and reach out via [email or web address]. Between 7PM-7AM, please contact night-coverage If you still have difficulty reaching the attending provider, please page the [facility] (Director on Call) for [facility] on amion for assistance.

## 2022-02-18 NOTE — Assessment & Plan Note (Signed)
No evidence of C. difficile during this hospitalization.  Patient unable to make any stool.  No elevated white blood cell count or fever.  CT of abdomen pelvis unremarkable.

## 2022-02-19 DIAGNOSIS — K5901 Slow transit constipation: Secondary | ICD-10-CM | POA: Diagnosis not present

## 2022-02-19 DIAGNOSIS — I159 Secondary hypertension, unspecified: Secondary | ICD-10-CM | POA: Diagnosis not present

## 2022-02-19 DIAGNOSIS — R531 Weakness: Secondary | ICD-10-CM | POA: Diagnosis not present

## 2022-02-19 DIAGNOSIS — I480 Paroxysmal atrial fibrillation: Secondary | ICD-10-CM | POA: Diagnosis not present

## 2022-02-19 MED ORDER — BISACODYL 10 MG RE SUPP
10.0000 mg | Freq: Every day | RECTAL | Status: DC | PRN
Start: 2022-02-19 — End: 2022-02-19
  Administered 2022-02-19: 10 mg via RECTAL
  Filled 2022-02-19: qty 1

## 2022-02-19 MED ORDER — POLYETHYLENE GLYCOL 3350 17 G PO PACK: 17.0000 g | PACK | Freq: Every day | ORAL | 0 refills | Status: DC | PRN

## 2022-02-19 MED ORDER — DOCUSATE SODIUM 100 MG PO CAPS
200.0000 mg | ORAL_CAPSULE | Freq: Two times a day (BID) | ORAL | Status: DC
  Administered 2022-02-19: 200 mg via ORAL
  Filled 2022-02-19: qty 2

## 2022-02-19 NOTE — TOC Progression Note (Addendum)
Transition of Care [facility]) - Progression Note    Patient Details  Name: Alicia Moran MRN: [id number] Date of Birth: 1928/08/02  Transition of Care [facility]) CM/SW Contact  Laurena Slimmer, RN Phone Number: 02/19/2022, 12:14 PM  Clinical Narrative:    Damaris Schooner with Monia Sabal at [facility] regarding discharge today. Facility will take admission today. She will needs to be transported by EMS. Facility requesting FL2.  MD notified.   Spoke with patient at bedside to advised of discharge back to [place] today. Patient agreeable to [facility]. No preference of agency.   1:40pm EMS arranged for 2:30pm  1:47pm Patient does not feel stable to discharge. MD notified.  1:57pm FL2 faxed to [phone].         Expected Discharge Plan and Services                                                 Social Determinants of Health (SDOH) Interventions    Readmission Risk Interventions     No data to display

## 2022-02-19 NOTE — TOC Progression Note (Signed)
Transition of Care [facility]) - Progression Note    Patient Details  Name: Alicia Moran MRN: [id number] Date of Birth: 1928-07-19  Transition of Care [facility]) CM/SW Contact  Laurena Slimmer, RN Phone Number: 02/19/2022, 10:51 AM  Clinical Narrative:    Bed offers pending. No current offers.          Expected Discharge Plan and Services                                                 Social Determinants of Health (SDOH) Interventions    Readmission Risk Interventions     No data to display

## 2022-02-19 NOTE — Assessment & Plan Note (Signed)
Likely due to poor p.o. intake and dehydration.  Gave patient MiraLAX and Colace prior to discharge.  Prescription given for as needed MiraLAX

## 2022-02-19 NOTE — NC FL2 (Signed)
[place] MEDICAID FL2 LEVEL OF CARE SCREENING TOOL     IDENTIFICATION  Patient Name: Alicia Moran Birthdate: [birth year] Sex: female Admission Date (Current Location): 02/17/2022  [facility] and [place] Number:  Chiropodist and Address:  [facility], [address] [place], [place] [address]      Provider Number: [id number]  Attending Physician Name and Address:  Hollice Espy, MD  Relative Name and Phone Number:  Katherina Right, [phone]    Current Level of Care: Hospital Recommended Level of Care: Assisted Living Facility Prior Approval Number:    Date Approved/Denied:   PASRR Number: [id number]  Discharge Plan: Other (Comment) (Assisted Living)    Current Diagnoses: Patient Active Problem List   Diagnosis Date Noted   Pancytopenia (HCC) 02/17/2022   Chronic diastolic CHF (congestive heart failure) (HCC) 12/23/2021   Moderate mitral regurgitation 12/23/2021   Mild aortic stenosis 12/23/2021   Arthritis of left knee 12/22/2021   Malnutrition of moderate degree 01/30/2021   Colitis due to Clostridium difficile 01/30/2021   Acute pain of right knee    Generalized weakness 01/29/2021   Disorder of bursae of shoulder region 03/15/2020   Cervical spondylosis 06/30/2018   Muscle strain of left scapular region 06/30/2018   Degenerative tear of medial meniscus of left knee 03/12/2018   Rotator cuff tendinitis, right 03/12/2018   Stress fracture of right calcaneus 01/14/2018   Left knee pain 10/23/2017   Stress fracture of left femur with delayed healing 10/23/2017   Osteoarthritis of knee 09/26/2017   Dysuria 01/19/2017   Acute bronchitis 09/19/2016   HTN (hypertension) 09/13/2016   Monoclonal gammopathy of unknown significance (MGUS) 09/11/2016   Fatigue 05/05/2016   Thrombocytopenia (HCC) 05/03/2016   Osteoporosis 03/20/2016   Valvular heart disease 12/23/2015   Constipation 09/04/2015   Perforation of right tympanic  membrane 03/28/2015   Mild cerebral atrophy (HCC) 03/02/2015   Neuropathy 03/02/2015   History of falling, presenting hazards to health 07/17/2014   Iron deficiency anemia due to chronic blood loss 07/17/2014   Positive FIT (fecal immunochemical test) 07/17/2014   Atherosclerotic peripheral vascular disease (HCC) 07/17/2014   H/O: CVA (cerebrovascular accident) 06/14/2014   Subdural hematoma (HCC) 06/12/2014   Adjustment disorder with mixed anxiety and depressed mood 10/31/2013   Intertrochanteric fracture of right hip (HCC) 03/05/2013   Lens replaced by other means 04/25/2012   Senile cataract 04/25/2012   Severe stage glaucoma 12/01/2011   Paroxysmal atrial fibrillation (HCC)    Stroke (HCC)    Migraine headache    High cholesterol     Orientation RESPIRATION BLADDER Height & Weight     Self, Time, Situation, Place  Normal External catheter Weight: 53.6 kg Height:  5\' 2"  (157.5 cm)  BEHAVIORAL SYMPTOMS/MOOD NEUROLOGICAL BOWEL NUTRITION STATUS  Other (Comment) (n/a)  (n/a) Continent Diet  AMBULATORY STATUS COMMUNICATION OF NEEDS Skin   Limited Assist Verbally Other (Comment) (ecchymosis bilateral hands)                       Personal Care Assistance Level of Assistance  Bathing, Dressing Bathing Assistance: Limited assistance Feeding assistance: Limited assistance Dressing Assistance: Limited assistance     Functional Limitations Info  Sight, Hearing Sight Info: Impaired (Blind in Right Eye) Hearing Info: Impaired      SPECIAL CARE FACTORS FREQUENCY  PT (By licensed PT), OT (By licensed OT)     PT Frequency: Min 2x weekly OT Frequency: Min 2x weekly  Contractures Contractures Info: Not present    Additional Factors Info  Code Status, Allergies Code Status Info: FULL Allergies Info: Amoxicillin, Erythromycin, Nsaids           Current Medications (02/19/2022):  This is the current hospital active medication list TAKE these medications      acetaminophen 500 MG tablet Commonly known as: TYLENOL Take 500-1,000 mg by mouth every 6 (six) hours as needed (pain).    ALPRAZolam 0.25 MG tablet Commonly known as: XANAX Take 0.5 tablets (0.125 mg total) by mouth 2 (two) times daily as needed for anxiety or sleep.    aspirin 81 MG tablet Take 81 mg by mouth daily.    Cholecalciferol 50 MCG (2000 UT) Caps Take 2,000 Units by mouth daily.    CoQ10 50 MG Caps Take 50 mg by mouth daily.    famotidine 10 MG tablet Commonly known as: PEPCID Take 10 mg by mouth daily as needed for heartburn or indigestion.    glucosamine-chondroitin 500-400 MG tablet Take 1 tablet by mouth as directed.    loperamide 2 MG capsule Commonly known as: IMODIUM Take 2 mg by mouth daily as needed.    meclizine 25 MG tablet Commonly known as: ANTIVERT Take 25 mg by mouth 3 (three) times daily as needed.    metoprolol succinate 50 MG 24 hr tablet Commonly known as: TOPROL-XL TAKE 1 TABLET EVERY DAY    oxyCODONE-acetaminophen 5-325 MG tablet Commonly known as: PERCOCET/ROXICET Take 0.5 tablets by mouth 2 (two) times daily.    polyethylene glycol 17 g packet Commonly known as: MIRALAX / GLYCOLAX Take 17 g by mouth daily as needed.    PRESERVISION AREDS 2+MULTI VIT PO Take 1 tablet by mouth daily.    rOPINIRole 0.25 MG tablet Commonly known as: REQUIP Take 1 tablet (0.25 mg total) by mouth 2 (two) times daily.    simvastatin 10 MG tablet Commonly known as: ZOCOR TAKE 1 TABLET AT BEDTIME     Discharge Medications: Please see discharge summary for a list of discharge medications.  Relevant Imaging Results:  Relevant Lab Results:   Additional Information Patient's SS# [id number]  Truddie Hidden, RN

## 2022-02-19 NOTE — Discharge Summary (Signed)
Physician Discharge Summary   Patient: Alicia Moran MRN: [id number] DOB: 02-Oct-1928  Admit date:     02/17/2022  Discharge date: 02/19/22  Discharge Physician: Hollice Espy   PCP: [name], MD   Recommendations at discharge:   New medication: MiraLAX 17 g p.o. daily as needed Patient being discharged back to her assisted living facility with home health PT, OT, RN and social work  Discharge Diagnoses: Principal Problem:   Generalized weakness Active Problems:   Paroxysmal atrial fibrillation (HCC)   Stroke (HCC)   Constipation   Monoclonal gammopathy of unknown significance (MGUS)   HTN (hypertension)   Pancytopenia (HCC)  Resolved Problems:   History of Clostridium difficile colitis  Hospital Course: 88 year old female with past medical history of paroxysmal atrial fibrillation, dyslipidemia, hypertension and recent hospitalization for severe left knee pain 2 months ago discharged to skilled nursing facility where she left from the ER 1 month ago.  Since then, patient has had progressive decline and is currently wheelchair-bound with minimal ambulation.  She presented to the emergency room on 9/2 with complaints of diarrhea times several days and weakness.  She was concerned about C.diff having had that in the past and had been recently treated as an outpatient for UTI.  In the emergency room, lab work unremarkable, analysis unremarkable vital signs noted some mild dehydration.  Patient unable to produce a stool sample, but CT of the abdomen pelvis noted no signs of any inflammation or infection.  Patient brought in for observation and started on some gentle IV fluids.  Patient evaluated by physical therapy who recommended skilled nursing.  Because she was not found to have any acute medical problems to treat, so she needed to stay in observation status.  Patient unable to return back to her assisted living facility on 9/3 due to it being a weekend and able to return on  9/4.  Assessment and Plan: * Generalized weakness Noted to be dehydrated.  Seen by PT recommending skilled nursing.  Looks to be just from overall deconditioning.  Had conversation with patient and recommended that she likely needs to upgrade from assisted living to more higher level of care which she does agree.  Case management assisting with this.  However, from hospital, patient needs to return back to skilled nursing and will her assisted living facility and will use PT and will set up home health PT, OT RN and social work.  Pancytopenia (HCC) Patient noted to have pancytopenia which is chronic Monitor closely during this hospitalization  HTN (hypertension) Continue metoprolol for blood pressure control  Monoclonal gammopathy of unknown significance (MGUS) Chronic  Constipation Likely due to poor p.o. intake and dehydration.  Gave patient MiraLAX and Colace prior to discharge.  Prescription given for as needed MiraLAX  Stroke [facility]) Stable Continue aspirin and statins  Paroxysmal atrial fibrillation (HCC) Continue metoprolol for rate control Not on long-term anticoagulation therapy due to thrombocytopenia with increased risk for bleeding  History of Clostridium difficile colitis-resolved as of 02/19/2022 No evidence of C. difficile during this hospitalization.  Patient unable to make any stool.  No elevated white blood cell count or fever.  CT of abdomen pelvis unremarkable.         Consultants: None Procedures performed: None Disposition: Assisted living Diet recommendation:  Discharge Diet Orders (From admission, onward)     Start     Ordered   02/19/22 0000  Diet - low sodium heart healthy  02/19/22 1255           Regular diet DISCHARGE MEDICATION: Allergies as of 02/19/2022       Reactions   Amoxicillin Diarrhea   Erythromycin Diarrhea   Nsaids Other (See Comments)   Other Reaction: GI ulcers        Medication List     TAKE these  medications    acetaminophen 500 MG tablet Commonly known as: TYLENOL Take 500-1,000 mg by mouth every 6 (six) hours as needed (pain).   ALPRAZolam 0.25 MG tablet Commonly known as: XANAX Take 0.5 tablets (0.125 mg total) by mouth 2 (two) times daily as needed for anxiety or sleep.   aspirin 81 MG tablet Take 81 mg by mouth daily.   Cholecalciferol 50 MCG (2000 UT) Caps Take 2,000 Units by mouth daily.   CoQ10 50 MG Caps Take 50 mg by mouth daily.   famotidine 10 MG tablet Commonly known as: PEPCID Take 10 mg by mouth daily as needed for heartburn or indigestion.   glucosamine-chondroitin 500-400 MG tablet Take 1 tablet by mouth as directed.   loperamide 2 MG capsule Commonly known as: IMODIUM Take 2 mg by mouth daily as needed.   meclizine 25 MG tablet Commonly known as: ANTIVERT Take 25 mg by mouth 3 (three) times daily as needed.   metoprolol succinate 50 MG 24 hr tablet Commonly known as: TOPROL-XL TAKE 1 TABLET EVERY DAY   oxyCODONE-acetaminophen 5-325 MG tablet Commonly known as: PERCOCET/ROXICET Take 0.5 tablets by mouth 2 (two) times daily.   polyethylene glycol 17 g packet Commonly known as: MIRALAX / GLYCOLAX Take 17 g by mouth daily as needed.   PRESERVISION AREDS 2+MULTI VIT PO Take 1 tablet by mouth daily.   rOPINIRole 0.25 MG tablet Commonly known as: REQUIP Take 1 tablet (0.25 mg total) by mouth 2 (two) times daily.   simvastatin 10 MG tablet Commonly known as: ZOCOR TAKE 1 TABLET AT BEDTIME        Discharge Exam: Filed Weights   02/17/22 1900  Weight: 53.6 kg   General: Alert and oriented x2, no acute distress Cardiovascular: Regular rate and rhythm, S1-S2 Clear to auscultation bilaterally  Condition at discharge: improving  The results of significant diagnostics from this hospitalization (including imaging, microbiology, ancillary and laboratory) are listed below for reference.   Imaging Studies: CT ABDOMEN PELVIS W  CONTRAST  Result Date: 02/17/2022 CLINICAL DATA:  Acute generalized abdominal pain. EXAM: CT ABDOMEN AND PELVIS WITH CONTRAST TECHNIQUE: Multidetector CT imaging of the abdomen and pelvis was performed using the standard protocol following bolus administration of intravenous contrast. RADIATION DOSE REDUCTION: This exam was performed according to the departmental dose-optimization program which includes automated exposure control, adjustment of the mA and/or kV according to patient size and/or use of iterative reconstruction technique. CONTRAST:  OMNIPAQUE IOHEXOL 300 MG/ML  SOLN COMPARISON:  March 31, 2021. FINDINGS: Lower chest: No acute abnormality. Hepatobiliary: No cholelithiasis or biliary dilatation is noted. Stable hepatic cysts are noted. Pancreas: Unremarkable. No pancreatic ductal dilatation or surrounding inflammatory changes. Spleen: Normal in size without focal abnormality. Adrenals/Urinary Tract: Adrenal glands appear normal. Nonobstructive right nephrolithiasis. No hydronephrosis or renal obstruction is noted. Urinary bladder is unremarkable. Stomach/Bowel: The stomach appears normal. There is no evidence of bowel obstruction or inflammation. Vascular/Lymphatic: Aortic atherosclerosis. No enlarged abdominal or pelvic lymph nodes. Reproductive: Uterus and bilateral adnexa are unremarkable. Other: No abdominal wall hernia or abnormality. No abdominopelvic ascites. Musculoskeletal: No acute or significant osseous findings.  IMPRESSION: Nonobstructive right nephrolithiasis. No acute abnormality seen in the abdomen or pelvis. Aortic Atherosclerosis (ICD10-I70.0). Electronically Signed   By: Lupita Raider M.D.   On: 02/17/2022 15:30    Microbiology: Results for orders placed or performed during the hospital encounter of 01/29/21  Resp Panel by RT-PCR (Flu A&B, Covid) Nasopharyngeal Swab     Status: None   Collection Time: 01/29/21  5:37 AM   Specimen: Nasopharyngeal Swab; Nasopharyngeal(NP)  swabs in vial transport medium  Result Value Ref Range Status   SARS Coronavirus 2 by RT PCR NEGATIVE NEGATIVE Final    Comment: (NOTE) SARS-CoV-2 target nucleic acids are NOT DETECTED.  The SARS-CoV-2 RNA is generally detectable in upper respiratory specimens during the acute phase of infection. The lowest concentration of SARS-CoV-2 viral copies this assay can detect is 138 copies/mL. A negative result does not preclude SARS-Cov-2 infection and should not be used as the sole basis for treatment or other patient management decisions. A negative result may occur with  improper specimen collection/handling, submission of specimen other than nasopharyngeal swab, presence of viral mutation(s) within the areas targeted by this assay, and inadequate number of viral copies(<138 copies/mL). A negative result must be combined with clinical observations, patient history, and epidemiological information. The expected result is Negative.  Fact Sheet for Patients:  BloggerCourse.com  Fact Sheet for Healthcare Providers:  SeriousBroker.it  This test is no t yet approved or cleared by the [place] FDA and  has been authorized for detection and/or diagnosis of SARS-CoV-2 by FDA under an Emergency Use Authorization (EUA). This EUA will remain  in effect (meaning this test can be used) for the duration of the COVID-19 declaration under Section 564(b)(1) of the Act, 21 U.S.C.section 360bbb-3(b)(1), unless the authorization is terminated  or revoked sooner.       Influenza A by PCR NEGATIVE NEGATIVE Final   Influenza B by PCR NEGATIVE NEGATIVE Final    Comment: (NOTE) The Xpert Xpress SARS-CoV-2/FLU/RSV plus assay is intended as an aid in the diagnosis of influenza from Nasopharyngeal swab specimens and should not be used as a sole basis for treatment. Nasal washings and aspirates are unacceptable for Xpert Xpress  SARS-CoV-2/FLU/RSV testing.  Fact Sheet for Patients: BloggerCourse.com  Fact Sheet for Healthcare Providers: SeriousBroker.it  This test is not yet approved or cleared by the [place] FDA and has been authorized for detection and/or diagnosis of SARS-CoV-2 by FDA under an Emergency Use Authorization (EUA). This EUA will remain in effect (meaning this test can be used) for the duration of the COVID-19 declaration under Section 564(b)(1) of the Act, 21 U.S.C. section 360bbb-3(b)(1), unless the authorization is terminated or revoked.  Performed at [facility], [address] [place], [place] [address]   Gastrointestinal Panel by PCR , Stool     Status: None   Collection Time: 01/30/21 12:07 AM   Specimen: Stool  Result Value Ref Range Status   Campylobacter species NOT DETECTED NOT DETECTED Final   Plesimonas shigelloides NOT DETECTED NOT DETECTED Final   Salmonella species NOT DETECTED NOT DETECTED Final   Yersinia enterocolitica NOT DETECTED NOT DETECTED Final   Vibrio species NOT DETECTED NOT DETECTED Final   Vibrio cholerae NOT DETECTED NOT DETECTED Final   Enteroaggregative E coli (EAEC) NOT DETECTED NOT DETECTED Final   Enteropathogenic E coli (EPEC) NOT DETECTED NOT DETECTED Final   Enterotoxigenic E coli (ETEC) NOT DETECTED NOT DETECTED Final   Shiga like toxin producing E coli (STEC) NOT  DETECTED NOT DETECTED Final   Shigella/Enteroinvasive E coli (EIEC) NOT DETECTED NOT DETECTED Final   Cryptosporidium NOT DETECTED NOT DETECTED Final   Cyclospora cayetanensis NOT DETECTED NOT DETECTED Final   Entamoeba histolytica NOT DETECTED NOT DETECTED Final   Giardia lamblia NOT DETECTED NOT DETECTED Final   Adenovirus F40/41 NOT DETECTED NOT DETECTED Final   Astrovirus NOT DETECTED NOT DETECTED Final   Norovirus GI/GII NOT DETECTED NOT DETECTED Final   Rotavirus A NOT DETECTED NOT DETECTED Final   Sapovirus (I,  II, IV, and V) NOT DETECTED NOT DETECTED Final    Comment: Performed at [facility], [address] [place], [place] [address]  C Difficile Quick Screen w PCR reflex     Status: Abnormal   Collection Time: 01/30/21 12:07 AM   Specimen: STOOL  Result Value Ref Range Status   C Diff antigen POSITIVE (A) NEGATIVE Final   C Diff toxin POSITIVE (A) NEGATIVE Final   C Diff interpretation Toxin producing C. difficile detected.  Final    Comment: CRITICAL RESULT CALLED TO, READ BACK BY AND VERIFIED WITH: Wilford Sports AT 0208 ON 01/30/21 BY SS Performed at [facility], [address] [place], [place] [address]   Resp Panel by RT-PCR (Flu A&B, Covid) Nasopharyngeal Swab     Status: None   Collection Time: 02/02/21  9:17 AM   Specimen: Nasopharyngeal Swab; Nasopharyngeal(NP) swabs in vial transport medium  Result Value Ref Range Status   SARS Coronavirus 2 by RT PCR NEGATIVE NEGATIVE Final    Comment: (NOTE) SARS-CoV-2 target nucleic acids are NOT DETECTED.  The SARS-CoV-2 RNA is generally detectable in upper respiratory specimens during the acute phase of infection. The lowest concentration of SARS-CoV-2 viral copies this assay can detect is 138 copies/mL. A negative result does not preclude SARS-Cov-2 infection and should not be used as the sole basis for treatment or other patient management decisions. A negative result may occur with  improper specimen collection/handling, submission of specimen other than nasopharyngeal swab, presence of viral mutation(s) within the areas targeted by this assay, and inadequate number of viral copies(<138 copies/mL). A negative result must be combined with clinical observations, patient history, and epidemiological information. The expected result is Negative.  Fact Sheet for Patients:  BloggerCourse.com  Fact Sheet for Healthcare Providers:  SeriousBroker.it  This test is no t  yet approved or cleared by the [place] FDA and  has been authorized for detection and/or diagnosis of SARS-CoV-2 by FDA under an Emergency Use Authorization (EUA). This EUA will remain  in effect (meaning this test can be used) for the duration of the COVID-19 declaration under Section 564(b)(1) of the Act, 21 U.S.C.section 360bbb-3(b)(1), unless the authorization is terminated  or revoked sooner.       Influenza A by PCR NEGATIVE NEGATIVE Final   Influenza B by PCR NEGATIVE NEGATIVE Final    Comment: (NOTE) The Xpert Xpress SARS-CoV-2/FLU/RSV plus assay is intended as an aid in the diagnosis of influenza from Nasopharyngeal swab specimens and should not be used as a sole basis for treatment. Nasal washings and aspirates are unacceptable for Xpert Xpress SARS-CoV-2/FLU/RSV testing.  Fact Sheet for Patients: BloggerCourse.com  Fact Sheet for Healthcare Providers: SeriousBroker.it  This test is not yet approved or cleared by the [place] FDA and has been authorized for detection and/or diagnosis of SARS-CoV-2 by FDA under an Emergency Use Authorization (EUA). This EUA will remain in effect (meaning this test can be used) for the  duration of the COVID-19 declaration under Section 564(b)(1) of the Act, 21 U.S.C. section 360bbb-3(b)(1), unless the authorization is terminated or revoked.  Performed at [facility], [address] [place], [place] [address]     Labs: CBC: Recent Labs  Lab 02/17/22 1126 02/18/22 0407  WBC 4.1 4.1  HGB 8.7* 7.7*  HCT 25.7* 22.8*  MCV 97.0 96.2  PLT 54* 49*   Basic Metabolic Panel: Recent Labs  Lab 02/17/22 1126 02/18/22 0407  NA 134* 136  K 3.9 4.0  CL 101 105  CO2 26 27  GLUCOSE 109* 88  BUN 15 12  CREATININE 0.80 0.86  CALCIUM 9.1 9.0   Liver Function Tests: No results for input(s): "AST", "ALT", "ALKPHOS", "BILITOT", "PROT", "ALBUMIN" in the last 168  hours. CBG: No results for input(s): "GLUCAP" in the last 168 hours.  Discharge time spent: less than 30 minutes.  Signed: Hollice Espy, MD [facility] 02/19/2022

## 2022-03-09 ENCOUNTER — Telehealth: Payer: Self-pay | Admitting: Cardiovascular Disease

## 2022-03-09 NOTE — Telephone Encounter (Signed)
Patient states that she was looking over her discharge papers from the hospital. And it says she has congestion heart failure. She wanted to know if dr Fletcher Anon knew that, and if he looking over her discharge papers. Please advise

## 2022-03-09 NOTE — Telephone Encounter (Signed)
Will route to Dr. Fletcher Anon to review and avdvise.

## 2022-03-09 NOTE — Telephone Encounter (Signed)
She does not have congestive heart failure.

## 2022-03-12 NOTE — Telephone Encounter (Signed)
Called to give the patient Dr. Tyrell Antonio response. Called the number listed in the pt chart and the call back number she provided. Unable to lmom on either number. No voicemail option.

## 2022-03-20 NOTE — Telephone Encounter (Signed)
2nd attempt to contact the patient with Dr. Tyrell Antonio response. Unable to lmom.

## 2022-05-22 ENCOUNTER — Other Ambulatory Visit: Payer: Self-pay | Admitting: Cardiovascular Disease

## 2022-05-30 ENCOUNTER — Ambulatory Visit (LOCAL_COMMUNITY_HEALTH_CENTER): Payer: Medicare Other

## 2022-05-30 DIAGNOSIS — Z719 Counseling, unspecified: Secondary | ICD-10-CM

## 2022-05-30 DIAGNOSIS — Z23 Encounter for immunization: Secondary | ICD-10-CM

## 2022-05-30 NOTE — Progress Notes (Signed)
  Are you feeling sick today? No   Have you ever received a dose of COVID-19 Vaccine? [employer], [place], [place], [place], Other) Yes  If yes, which vaccine and how many doses?   PFIZER, 4   Did you bring the vaccination record card or other documentation?  Yes   Do you have a health condition or are undergoing treatment that makes you moderately or severely immunocompromised? This would include, but not be limited to: cancer, HIV, organ transplant, immunosuppressive therapy/high-dose corticosteroids, or moderate/severe primary immunodeficiency.  No  Have you received COVID-19 vaccine before or during hematopoietic cell transplant (HCT) or CAR-T-cell therapies? No  Have you ever had an allergic reaction to: (This would include a severe allergic reaction or a reaction that caused hives, swelling, or respiratory distress, including wheezing.) A component of a COVID-19 vaccine or a previous dose of COVID-19 vaccine? No   Have you ever had an allergic reaction to another vaccine (other thanCOVID-19 vaccine) or an injectable medication? (This would include a severe allergic reaction or a reaction that caused hives, swelling, or respiratory distress, including wheezing.)   No    Do you have a history of any of the following:  Myocarditis or Pericarditis No  Dermal fillers:  No  Multisystem Inflammatory Syndrome (MIS-C or MIS-A)? No  COVID-19 disease within the past 3 months? No  Vaccinated with monkeypox vaccine in the last 4 weeks? No  Eligible, administered Pfizer comirnaty [phone]. Monitored and tolerated well. Provided VIS and NCIR copy. M.Syvanna Ciolino, LPN.

## 2022-07-06 ENCOUNTER — Other Ambulatory Visit: Payer: Self-pay | Admitting: Otolaryngology

## 2022-07-06 DIAGNOSIS — R221 Localized swelling, mass and lump, neck: Secondary | ICD-10-CM

## 2022-07-12 ENCOUNTER — Other Ambulatory Visit: Payer: Self-pay | Admitting: Otolaryngology

## 2022-07-12 DIAGNOSIS — R221 Localized swelling, mass and lump, neck: Secondary | ICD-10-CM

## 2022-07-17 ENCOUNTER — Ambulatory Visit
Admission: RE | Admit: 2022-07-17 | Discharge: 2022-07-17 | Disposition: A | Discharge status: Home / Self Care | Payer: Medicare Other | Source: Ambulatory Visit | Attending: Otolaryngology | Admitting: Otolaryngology

## 2022-07-17 ENCOUNTER — Other Ambulatory Visit: Payer: Medicare Other

## 2022-07-17 DIAGNOSIS — R221 Localized swelling, mass and lump, neck: Secondary | ICD-10-CM

## 2022-07-31 ENCOUNTER — Ambulatory Visit
Admission: RE | Admit: 2022-07-31 | Discharge: 2022-07-31 | Disposition: A | Discharge status: Home / Self Care | Payer: Medicare Other | Source: Ambulatory Visit | Attending: Otolaryngology | Admitting: Otolaryngology

## 2022-07-31 DIAGNOSIS — R221 Localized swelling, mass and lump, neck: Secondary | ICD-10-CM

## 2022-07-31 MED ORDER — IOPAMIDOL (ISOVUE-300) INJECTION 61%
75.0000 mL | Freq: Once | INTRAVENOUS | Status: AC | PRN
Start: 2022-07-31 — End: 2022-07-31
  Administered 2022-07-31: 75 mL via INTRAVENOUS

## 2022-08-06 ENCOUNTER — Other Ambulatory Visit: Payer: Self-pay | Admitting: Cardiovascular Disease

## 2022-08-07 ENCOUNTER — Ambulatory Visit (INDEPENDENT_AMBULATORY_CARE_PROVIDER_SITE_OTHER): Payer: Medicare Other | Admitting: Vascular Surgery

## 2022-08-07 ENCOUNTER — Encounter (INDEPENDENT_AMBULATORY_CARE_PROVIDER_SITE_OTHER): Payer: Self-pay | Admitting: Vascular Surgery

## 2022-08-07 VITALS — BP 134/72 | HR 70 | Resp 15

## 2022-08-07 DIAGNOSIS — E7800 Pure hypercholesterolemia, unspecified: Secondary | ICD-10-CM

## 2022-08-07 DIAGNOSIS — I159 Secondary hypertension, unspecified: Secondary | ICD-10-CM

## 2022-08-07 DIAGNOSIS — I6523 Occlusion and stenosis of bilateral carotid arteries: Secondary | ICD-10-CM

## 2022-08-07 DIAGNOSIS — I6529 Occlusion and stenosis of unspecified carotid artery: Secondary | ICD-10-CM | POA: Insufficient documentation

## 2022-08-07 NOTE — Assessment & Plan Note (Signed)
I have independently reviewed the CT scan.  This is a relatively low quality study for arterial evaluation as it was not a CT angiogram, but it does demonstrate what appears to be a moderate right carotid artery stenosis.  The official report is of a 50% stenosis.  I think it is slightly worse and this is probably in the 60% range but is not high-grade.  She has some degree of stenosis on the left but this appears to be less than 50%.  I had a long discussion with the patient today regarding options.  Given her asymptomatic status and her age, medical management alone would be appropriate.  She is on appropriate therapy with aspirin and a statin agent.  I will plan to see her back in about 3 months with a carotid duplex for follow-up.  She will seek immediate medical attention with any signs of cerebrovascular ischemia.

## 2022-08-07 NOTE — Assessment & Plan Note (Signed)
lipid control important in reducing the progression of atherosclerotic disease. Continue statin therapy  

## 2022-08-07 NOTE — Progress Notes (Signed)
Patient ID: Alicia Moran, female   DOB: 11-20-1928, 89 y.o.   MRN: [id number]  Chief Complaint  Patient presents with   New Patient (Initial Visit)    Ref Vaught consult right carotid blockage. CT on 07/31/2022    HPI Alicia Moran is a 89 y.o. female.  I am asked to see the patient by Dr. Andee Poles for evaluation of carotid artery stenosis.  This was seen on a CT scan performed for ENT issues and was an incidental finding.  I have independently reviewed the CT scan.  This is a relatively low quality study for arterial evaluation as it was not a CT angiogram, but it does demonstrate what appears to be a moderate right carotid artery stenosis.  The official report is of a 50% stenosis.  I think it is slightly worse and this is probably in the 60% range but is not high-grade.  She has some degree of stenosis on the left but this appears to be less than 50%.  In discussions with the patient, she has not had focal neurologic symptoms of cerebrovascular ischemia. Specifically, the patient denies amaurosis fugax, speech or swallowing difficulties, or arm or leg weakness or numbness in the last decade.  She has a remote history of stroke almost 20 years ago.  She was told by [facility] almost 30 years ago she had a 40% stenosis in her carotid artery that did not require treatment.      Past Medical History:  Diagnosis Date   Hyperlipidemia    Migraine headache    Stroke (HCC)     No past surgical history on file.   Family History  Problem Relation Age of Onset   Hypertension Mother    Diabetes Mother    Hypertension Father   No bleeding or clotting disorder   Social History   Tobacco Use   Smoking status: Former    Types: Cigarettes    Quit date: 03/29/1986    Years since quitting: 36.3   Smokeless tobacco: Never  Substance Use Topics   Alcohol use: No   Drug use: No     Allergies  Allergen Reactions   Amoxicillin Diarrhea   Erythromycin Diarrhea   Nsaids Other (See  Comments)    Other Reaction: GI ulcers    Current Outpatient Medications  Medication Sig Dispense Refill   acetaminophen (TYLENOL) 500 MG tablet Take 500-1,000 mg by mouth every 6 (six) hours as needed (pain).     ALPRAZolam (XANAX) 0.25 MG tablet Take 0.5 tablets (0.125 mg total) by mouth 2 (two) times daily as needed for anxiety or sleep. 4 tablet 0   aspirin 81 MG tablet Take 81 mg by mouth daily.     Cholecalciferol 50 MCG (2000 UT) CAPS Take 2,000 Units by mouth daily.     Coenzyme Q10 (COQ10) 50 MG CAPS Take 50 mg by mouth daily.     famotidine (PEPCID) 10 MG tablet Take 10 mg by mouth daily as needed for heartburn or indigestion.     glucosamine-chondroitin 500-400 MG tablet Take 1 tablet by mouth as directed.     loperamide (IMODIUM) 2 MG capsule Take 2 mg by mouth daily as needed.     meclizine (ANTIVERT) 25 MG tablet Take 25 mg by mouth 3 (three) times daily as needed.     metoprolol succinate (TOPROL-XL) 50 MG 24 hr tablet TAKE 1 TABLET EVERY DAY 90 tablet 0   Multiple Vitamins-Minerals (PRESERVISION AREDS 2+MULTI VIT PO) Take  1 tablet by mouth daily.     oxyCODONE-acetaminophen (PERCOCET/ROXICET) 5-325 MG tablet Take 0.5 tablets by mouth 2 (two) times daily.     polyethylene glycol (MIRALAX / GLYCOLAX) 17 g packet Take 17 g by mouth daily as needed. 14 each 0   rOPINIRole (REQUIP) 0.25 MG tablet Take 1 tablet (0.25 mg total) by mouth 2 (two) times daily.     simvastatin (ZOCOR) 10 MG tablet TAKE 1 TABLET AT BEDTIME 90 tablet 3   No current facility-administered medications for this visit.      REVIEW OF SYSTEMS (Negative unless checked)  Constitutional: [] Weight loss  [] Fever  [] Chills Cardiac: [] Chest pain   [] Chest pressure   [] Palpitations   [] Shortness of breath when laying flat   [] Shortness of breath at rest   [] Shortness of breath with exertion. Vascular:  [] Pain in legs with walking   [] Pain in legs at rest   [] Pain in legs when laying flat   [] Claudication   [] Pain  in feet when walking  [] Pain in feet at rest  [] Pain in feet when laying flat   [] History of DVT   [] Phlebitis   [] Swelling in legs   [] Varicose veins   [] Non-healing ulcers Pulmonary:   [] Uses home oxygen   [] Productive cough   [] Hemoptysis   [] Wheeze  [] COPD   [] Asthma Neurologic:  [] Dizziness  [] Blackouts   [] Seizures   [x] History of stroke   [] History of TIA  [] Aphasia   [] Temporary blindness   [] Dysphagia   [] Weakness or numbness in arms   [] Weakness or numbness in legs Musculoskeletal:  [x] Arthritis   [] Joint swelling   [] Joint pain   [] Low back pain Hematologic:  [] Easy bruising  [] Easy bleeding   [] Hypercoagulable state   [] Anemic  [] Hepatitis Gastrointestinal:  [] Blood in stool   [] Vomiting blood  [] Gastroesophageal reflux/heartburn   [] Abdominal pain Genitourinary:  [] Chronic kidney disease   [] Difficult urination  [] Frequent urination  [] Burning with urination   [] Hematuria Skin:  [] Rashes   [] Ulcers   [] Wounds Psychological:  [] History of anxiety   []  History of major depression.    Physical Exam BP 134/72 (BP Location: Left Arm)   Pulse 70   Resp 15  Gen:  WD/WN, NAD. Appears younger than stated age. Head: [place]/AT, No temporalis wasting.  Ear/Nose/Throat: Hearing somewhat diminished, nares w/o erythema or drainage, oropharynx w/o Erythema/Exudate Eyes: Conjunctiva clear, sclera non-icteric  Neck: trachea midline.  No JVD.  Pulmonary:  Good air movement, respirations not labored, no use of accessory muscles  Cardiac: somewhat irregular Vascular:  Vessel Right Left  Radial Palpable Palpable                                   Gastrointestinal:. No masses, surgical incisions, or scars. Musculoskeletal: M/S 5/5 throughout.  Extremities without ischemic changes.  No deformity or atrophy. No edema. In a wheelchair Neurologic: Sensation grossly intact in extremities.  Symmetrical.  Speech is fluent. Motor exam as listed above. Psychiatric: Judgment intact, Mood & affect  appropriate for pt's clinical situation. Dermatologic: No rashes or ulcers noted.  No cellulitis or open wounds.    Radiology CT SOFT TISSUE NECK W CONTRAST  Result Date: 07/31/2022 CLINICAL DATA:  Multiple neck masses EXAM: CT NECK WITH CONTRAST TECHNIQUE: Multidetector CT imaging of the neck was performed using the standard protocol following the bolus administration of intravenous contrast. RADIATION DOSE REDUCTION: This exam was performed according to  the departmental dose-optimization program which includes automated exposure control, adjustment of the mA and/or kV according to patient size and/or use of iterative reconstruction technique. CONTRAST:  75mL ISOVUE-300 IOPAMIDOL (ISOVUE-300) INJECTION 61% COMPARISON:  Ultrasound 07/17/2022.  Cervical spine CT 06/14/2020. FINDINGS: Pharynx and larynx: The uvula is redundant. I do not think that there is an actual posterior nasopharyngeal/or pharyngeal mass. No mucosal or submucosal mass lesion is seen in the region of the scan. Question mild vocal fold paresis on the right without a definable mass lesion. Salivary glands: Parotid and submandibular glands are normal. Thyroid: Normal. Lymph nodes: Cervical chain lymph nodes are slightly more prominent than were visible on a CT scan from December of 2021, but do not exceed the upper limits of normal for short axis diameter. The largest left level 2 lymph node axial image 47 shows short axis dimension of 8 mm. In 2021, short axis dimension was 5 mm. There is a left level 5 node axial image 57 with short axis dimension of 7 mm today. In 2021 short axis dimension was 4 mm. Other smaller nodes are similarly enlarged on the left. On the right, there is also been slight nodal enlargement since the study of 2021, though not as notable as on the left. Findings are nonspecific and could be due to reactive systemic nodal enlargement. The possibilities of early lymphoproliferative disease or occult head in neck lesion  are not excluded. Vascular: Atherosclerotic calcification of the carotid bifurcations. Stenosis on the left is not flow limiting. On the right, stenosis is estimated at 50%, though this study was not done in the form of a detailed CT angiogram. Limited intracranial: Normal Visualized orbits: Negative. Previous glaucoma surgery on the right. Mastoids and visualized paranasal sinuses: Clear Skeleton: Ordinary spondylosis. Old mild compression deformity at T1. Upper chest: Lung apices are clear. Other: None IMPRESSION: 1. Redundancy of the uvula. I do not think there is an actual posterior nasopharyngeal/or pharyngeal mass lesion. 2. Cervical chain lymph nodes are slightly more prominent than were visible on a CT scan of the cervical spine from December of 2021, more on the left than the right, but do not exceed the upper limits of normal for short axis diameter. Findings are nonspecific and could be due to reactive systemic nodal enlargement. The possibilities of early lymphoproliferative disease or occult head in neck lesion are not excluded at this time however. Clinical observation may be prudent. 3. Question mild vocal fold paresis on the right without a definable mass lesion. 4. Atherosclerotic calcification of the carotid bifurcations. Stenosis on the left is not flow limiting. On the right, stenosis is estimated at 50%, though this study was not done in the form of a detailed CT angiogram. Electronically Signed   By: Paulina Fusi M.D.   On: 07/31/2022 14:11   US SOFT TISSUE HEAD & NECK (NON-THYROID)  Result Date: 07/18/2022 CLINICAL DATA:  Left-sided neck mass for 2-3 weeks, nonpainful EXAM: ULTRASOUND OF HEAD/NECK SOFT TISSUES TECHNIQUE: Ultrasound examination of the head and neck soft tissues was performed in the area of clinical concern. COMPARISON:  Cervical spine CT 06/14/2020 FINDINGS: The left neck mass correlates with enlarged and echogenic lymph nodes seen along the left jugular chain and measuring  up to 1.4 cm in length. No underlying soft tissue mass or inflammatory changes. IMPRESSION: Multiple enlarged left cervical lymph nodes without visible underlying cause, new from 2021. If no clear clinical explanation neck CT with contrast would be recommended. Electronically Signed   By:  Tiburcio Pea M.D.   On: 07/18/2022 07:56    Labs No results found for this or any previous visit (from the past 2160 hour(s)).  Assessment/Plan:  Carotid stenosis I have independently reviewed the CT scan.  This is a relatively low quality study for arterial evaluation as it was not a CT angiogram, but it does demonstrate what appears to be a moderate right carotid artery stenosis.  The official report is of a 50% stenosis.  I think it is slightly worse and this is probably in the 60% range but is not high-grade.  She has some degree of stenosis on the left but this appears to be less than 50%.  I had a long discussion with the patient today regarding options.  Given her asymptomatic status and her age, medical management alone would be appropriate.  She is on appropriate therapy with aspirin and a statin agent.  I will plan to see her back in about 3 months with a carotid duplex for follow-up.  She will seek immediate medical attention with any signs of cerebrovascular ischemia.  HTN (hypertension) blood pressure control important in reducing the progression of atherosclerotic disease. On appropriate oral medications.   High cholesterol lipid control important in reducing the progression of atherosclerotic disease. Continue statin therapy      Festus Barren 08/07/2022, 11:53 AM   This note was created with Dragon medical transcription system.  Any errors from dictation are unintentional.

## 2022-08-07 NOTE — Assessment & Plan Note (Signed)
blood pressure control important in reducing the progression of atherosclerotic disease. On appropriate oral medications.  

## 2022-08-27 ENCOUNTER — Other Ambulatory Visit: Payer: Self-pay | Admitting: *Deleted

## 2022-08-27 ENCOUNTER — Encounter: Payer: Self-pay | Admitting: Internal Medicine

## 2022-08-27 DIAGNOSIS — D509 Iron deficiency anemia, unspecified: Secondary | ICD-10-CM

## 2022-08-30 ENCOUNTER — Inpatient Hospital Stay: Payer: Medicare Other | Attending: Oncology

## 2022-08-30 ENCOUNTER — Encounter: Payer: Self-pay | Admitting: Oncology

## 2022-08-30 ENCOUNTER — Inpatient Hospital Stay (HOSPITAL_BASED_OUTPATIENT_CLINIC_OR_DEPARTMENT_OTHER): Payer: Medicare Other | Admitting: Oncology

## 2022-08-30 VITALS — BP 137/65 | HR 73 | Temp 98.0°F | Resp 16 | Ht 62.0 in | Wt 121.0 lb

## 2022-08-30 DIAGNOSIS — D509 Iron deficiency anemia, unspecified: Secondary | ICD-10-CM

## 2022-08-30 DIAGNOSIS — D472 Monoclonal gammopathy: Secondary | ICD-10-CM | POA: Insufficient documentation

## 2022-08-30 DIAGNOSIS — R5383 Other fatigue: Secondary | ICD-10-CM | POA: Insufficient documentation

## 2022-08-30 DIAGNOSIS — Z87891 Personal history of nicotine dependence: Secondary | ICD-10-CM | POA: Insufficient documentation

## 2022-08-30 DIAGNOSIS — D696 Thrombocytopenia, unspecified: Secondary | ICD-10-CM | POA: Insufficient documentation

## 2022-08-30 DIAGNOSIS — R531 Weakness: Secondary | ICD-10-CM | POA: Diagnosis not present

## 2022-08-30 LAB — CBC WITH DIFFERENTIAL/PLATELET
Abs Immature Granulocytes: 0.02 10*3/uL (ref 0.00–0.07)
Basophils Absolute: 0 10*3/uL (ref 0.0–0.1)
Basophils Relative: 1 %
Eosinophils Absolute: 0 10*3/uL (ref 0.0–0.5)
Eosinophils Relative: 1 %
HCT: 25.1 % — ABNORMAL LOW (ref 36.0–46.0)
Hemoglobin: 8.3 g/dL — ABNORMAL LOW (ref 12.0–15.0)
Immature Granulocytes: 1 %
Lymphocytes Relative: 43 %
Lymphs Abs: 1.8 10*3/uL (ref 0.7–4.0)
MCH: 32.4 pg (ref 26.0–34.0)
MCHC: 33.1 g/dL (ref 30.0–36.0)
MCV: 98 fL (ref 80.0–100.0)
Monocytes Absolute: 0.3 10*3/uL (ref 0.1–1.0)
Monocytes Relative: 6 %
Neutro Abs: 2 10*3/uL (ref 1.7–7.7)
Neutrophils Relative %: 48 %
Platelets: 63 10*3/uL — ABNORMAL LOW (ref 150–400)
RBC: 2.56 MIL/uL — ABNORMAL LOW (ref 3.87–5.11)
RDW: 13.8 % (ref 11.5–15.5)
WBC: 4.1 10*3/uL (ref 4.0–10.5)
nRBC: 0 % (ref 0.0–0.2)

## 2022-08-30 LAB — PREPARE RBC (CROSSMATCH)

## 2022-08-30 LAB — FERRITIN: Ferritin: 53 ng/mL (ref 11–307)

## 2022-08-30 LAB — IRON AND TIBC
Iron: 63 ug/dL (ref 28–170)
Saturation Ratios: 20 % (ref 10.4–31.8)
TIBC: 316 ug/dL (ref 250–450)
UIBC: 253 ug/dL

## 2022-08-30 NOTE — Progress Notes (Signed)
Concerned about platelet count and feeling dizzy. Wants to know if any of her issues with her blood work could be related to restless legs.

## 2022-08-30 NOTE — Progress Notes (Signed)
[facility]  Telephone:([phone]) [phone] [phone]) [phone]  ID: Alicia Moran OB: 1928/11/08  MR#: [id number]  AOZ#:[id number]  [name], [name] (Internal Medicine) [name], [name] (Cardiology) [name], [name] (Oncology)  CHIEF COMPLAINT: Thrombocytopenia, iron deficiency anemia, MGUS.  INTERVAL HISTORY: Patient last seen in clinic in December 2022.  She is referred back by primary care for declining hemoglobin, increasing weakness and fatigue, and consideration of blood transfusion.  She continues to have multiple medical complaints. She has no neurologic complaints. She denies any recent fevers or illnesses. She has a good appetite and denies weight loss. She has no chest pain,cough, hemoptysis, or shortness of breath. She denies any nausea, vomiting, constipation, or diarrhea. She has no melena or hematochezia. She has no urinary complaints.  Patient offers no further specific complaints today.  REVIEW OF SYSTEMS:   Review of Systems  Constitutional:  Positive for malaise/fatigue. Negative for fever and weight loss.  Respiratory: Negative.  Negative for cough and shortness of breath.   Cardiovascular: Negative.  Negative for chest pain and leg swelling.  Gastrointestinal: Negative.  Negative for abdominal pain, blood in stool and melena.  Genitourinary: Negative.  Negative for hematuria.  Musculoskeletal: Negative.  Negative for back pain.  Skin: Negative.  Negative for rash.  Neurological:  Positive for weakness. Negative for focal weakness and headaches.  Endo/Heme/Allergies: Negative.  Does not bruise/bleed easily.  Psychiatric/Behavioral: Negative.  The patient is not nervous/anxious and does not have insomnia.     As per HPI. Otherwise, a complete review of systems is negative.  PAST MEDICAL HISTORY: Past Medical History:  Diagnosis Date   Hyperlipidemia     Migraine headache    Stroke (HCC)     PAST SURGICAL HISTORY: History reviewed. No pertinent surgical history.  FAMILY HISTORY: Family History  Problem Relation Age of Onset   Hypertension Mother    Diabetes Mother    Hypertension Father     ADVANCED DIRECTIVES (Y/N):  N  HEALTH MAINTENANCE: Social History   Tobacco Use   Smoking status: Former    Types: Cigarettes    Quit date: 03/29/1986    Years since quitting: 36.4   Smokeless tobacco: Never  Substance Use Topics   Alcohol use: No   Drug use: No     Colonoscopy:  PAP:  Bone density:  Lipid panel:  Allergies  Allergen Reactions   Amoxicillin Diarrhea   Erythromycin Diarrhea   Nsaids Other (See Comments)    Other Reaction: GI ulcers    Current Outpatient Medications  Medication Sig Dispense Refill   acetaminophen (TYLENOL) 500 MG tablet Take 500-1,000 mg by mouth every 6 (six) hours as needed (pain).     ALPRAZolam (XANAX) 0.25 MG tablet Take 0.5 tablets (0.125 mg total) by mouth 2 (two) times daily as needed for anxiety or sleep. 4 tablet 0   aspirin 81 MG tablet Take 81 mg by mouth daily.     Cholecalciferol 50 MCG (2000 UT) CAPS Take 2,000 Units by mouth daily.     Coenzyme Q10 (COQ10) 50 MG CAPS Take 50 mg by mouth daily.     famotidine (PEPCID) 10 MG tablet Take 10 mg by mouth daily as needed for heartburn or indigestion.     glucosamine-chondroitin 500-400 MG tablet Take 1 tablet by mouth as directed.     loperamide (IMODIUM) 2 MG capsule Take 2 mg by mouth  daily as needed.     meclizine (ANTIVERT) 25 MG tablet Take 25 mg by mouth 3 (three) times daily as needed.     metoprolol succinate (TOPROL-XL) 50 MG 24 hr tablet TAKE 1 TABLET EVERY DAY 90 tablet 0   Multiple Vitamins-Minerals (PRESERVISION AREDS 2+MULTI VIT PO) Take 1 tablet by mouth daily.     oxyCODONE-acetaminophen (PERCOCET/ROXICET) 5-325 MG tablet Take 0.5 tablets by mouth 2 (two) times daily.     polyethylene glycol (MIRALAX / GLYCOLAX) 17  g packet Take 17 g by mouth daily as needed. 14 each 0   rOPINIRole (REQUIP) 0.25 MG tablet Take 1 tablet (0.25 mg total) by mouth 2 (two) times daily.     simvastatin (ZOCOR) 10 MG tablet TAKE 1 TABLET AT BEDTIME 90 tablet 3   No current facility-administered medications for this visit.    OBJECTIVE: Vitals:   08/30/22 1011  BP: 137/65  Pulse: 73  Resp: 16  Temp: 98 F (36.7 C)  SpO2: 98%     Body mass index is 22.13 kg/m.    ECOG FS:0 - Asymptomatic  General: Well-developed, well-nourished, no acute distress.  Sitting in a wheelchair. Eyes: Pink conjunctiva, anicteric sclera. HEENT: Normocephalic, moist mucous membranes. Lungs: No audible wheezing or coughing. Heart: Regular rate and rhythm. Abdomen: Soft, nontender, no obvious distention. Musculoskeletal: No edema, cyanosis, or clubbing. Neuro: Alert, answering all questions appropriately. Cranial nerves grossly intact. Skin: No rashes or petechiae noted. Psych: Normal affect.  LAB RESULTS:  Lab Results  Component Value Date   NA 136 02/18/2022   K 4.0 02/18/2022   CL 105 02/18/2022   CO2 27 02/18/2022   GLUCOSE 88 02/18/2022   BUN 12 02/18/2022   CREATININE 0.86 02/18/2022   CALCIUM 9.0 02/18/2022   PROT 7.1 12/22/2021   ALBUMIN 3.8 12/22/2021   AST 17 12/22/2021   ALT 12 12/22/2021   ALKPHOS 68 12/22/2021   BILITOT 0.8 12/22/2021   GFRNONAA >60 02/18/2022   GFRAA >60 08/31/2019    Lab Results  Component Value Date   WBC 4.1 08/30/2022   NEUTROABS 2.0 08/30/2022   HGB 8.3 (L) 08/30/2022   HCT 25.1 (L) 08/30/2022   MCV 98.0 08/30/2022   PLT 63 (L) 08/30/2022   Lab Results  Component Value Date   IRON 53 05/23/2021   TIBC 321 05/23/2021   IRONPCTSAT 17 05/23/2021   Lab Results  Component Value Date   FERRITIN 46 05/23/2021       STUDIES: No results found.  ASSESSMENT: Thrombocytopenia, Iron deficiency anemia, MGUS  PLAN:    Anemia: Patient's hemoglobin is trending down and she is  symptomatic.  Iron stores are pending at time of dictation.  Given her chronic thrombocytopenia, suspect patient has an underlying bone marrow disorder but this would take a bone marrow biopsy to diagnose which we will not do.  Return to clinic tomorrow to receive 1 unit packed red blood cells.  Patient will then return to clinic in 1 month for repeat laboratory work and further evaluation. Thrombocytopenia: Patient's platelet count has ranged from 41-63 since December 21, 2021.  No intervention needed.  Monitor. MGUS:  Patient's most recent M-spike on May 04, 2021 was reported at 1.3 which is unchanged from 2 years prior. Her kappa-free light chains are 294.8.  No intervention is needed.  Can discontinue monitoring.  Patient expressed understanding and was in agreement with this plan. She also understands that She can call clinic at any time with any questions,  concerns, or complaints.    [name], MD   08/30/2022 11:11 AM

## 2022-08-31 ENCOUNTER — Inpatient Hospital Stay: Payer: Medicare Other

## 2022-08-31 DIAGNOSIS — D509 Iron deficiency anemia, unspecified: Secondary | ICD-10-CM

## 2022-08-31 DIAGNOSIS — D472 Monoclonal gammopathy: Secondary | ICD-10-CM | POA: Diagnosis not present

## 2022-08-31 MED ORDER — SODIUM CHLORIDE 0.9% IV SOLUTION
250.0000 mL | Freq: Once | INTRAVENOUS | Status: AC
  Administered 2022-08-31: 250 mL via INTRAVENOUS
  Filled 2022-08-31: qty 250

## 2022-08-31 MED ORDER — DIPHENHYDRAMINE HCL 50 MG/ML IJ SOLN
25.0000 mg | Freq: Once | INTRAMUSCULAR | Status: AC
  Administered 2022-08-31: 25 mg via INTRAVENOUS
  Filled 2022-08-31: qty 1

## 2022-08-31 MED ORDER — ACETAMINOPHEN 325 MG PO TABS
650.0000 mg | ORAL_TABLET | Freq: Once | ORAL | Status: AC
  Administered 2022-08-31: 650 mg via ORAL
  Filled 2022-08-31: qty 2

## 2022-08-31 NOTE — Patient Instructions (Signed)

## 2022-09-01 LAB — BPAM RBC
Blood Product Expiration Date: 202404172359
ISSUE DATE / TIME: 202403150819
Unit Type and Rh: 5100

## 2022-09-01 LAB — TYPE AND SCREEN
ABO/RH(D): O POS
Antibody Screen: POSITIVE
Unit division: 0

## 2022-09-06 ENCOUNTER — Other Ambulatory Visit: Payer: Self-pay

## 2022-09-06 ENCOUNTER — Emergency Department: Payer: Medicare Other

## 2022-09-06 ENCOUNTER — Encounter: Payer: Self-pay | Admitting: Internal Medicine

## 2022-09-06 ENCOUNTER — Observation Stay
Admission: EM | Admit: 2022-09-06 | Discharge: 2022-09-07 | Disposition: A | Discharge status: Home / Self Care | Payer: Medicare Other | Attending: Internal Medicine | Admitting: Internal Medicine

## 2022-09-06 DIAGNOSIS — E440 Moderate protein-calorie malnutrition: Secondary | ICD-10-CM | POA: Diagnosis present

## 2022-09-06 DIAGNOSIS — D61818 Other pancytopenia: Secondary | ICD-10-CM | POA: Insufficient documentation

## 2022-09-06 DIAGNOSIS — Z1152 Encounter for screening for COVID-19: Secondary | ICD-10-CM | POA: Diagnosis not present

## 2022-09-06 DIAGNOSIS — E871 Hypo-osmolality and hyponatremia: Secondary | ICD-10-CM | POA: Diagnosis not present

## 2022-09-06 DIAGNOSIS — D472 Monoclonal gammopathy: Secondary | ICD-10-CM | POA: Diagnosis not present

## 2022-09-06 DIAGNOSIS — R8281 Pyuria: Secondary | ICD-10-CM

## 2022-09-06 DIAGNOSIS — F4323 Adjustment disorder with mixed anxiety and depressed mood: Secondary | ICD-10-CM | POA: Diagnosis present

## 2022-09-06 DIAGNOSIS — S065XAA Traumatic subdural hemorrhage with loss of consciousness status unknown, initial encounter: Secondary | ICD-10-CM | POA: Diagnosis present

## 2022-09-06 DIAGNOSIS — I10 Essential (primary) hypertension: Secondary | ICD-10-CM | POA: Diagnosis present

## 2022-09-06 DIAGNOSIS — Z8673 Personal history of transient ischemic attack (TIA), and cerebral infarction without residual deficits: Secondary | ICD-10-CM | POA: Diagnosis not present

## 2022-09-06 DIAGNOSIS — D500 Iron deficiency anemia secondary to blood loss (chronic): Secondary | ICD-10-CM | POA: Diagnosis not present

## 2022-09-06 DIAGNOSIS — K529 Noninfective gastroenteritis and colitis, unspecified: Secondary | ICD-10-CM | POA: Diagnosis not present

## 2022-09-06 DIAGNOSIS — G629 Polyneuropathy, unspecified: Secondary | ICD-10-CM | POA: Diagnosis not present

## 2022-09-06 DIAGNOSIS — Z87891 Personal history of nicotine dependence: Secondary | ICD-10-CM | POA: Diagnosis not present

## 2022-09-06 DIAGNOSIS — K5900 Constipation, unspecified: Secondary | ICD-10-CM | POA: Diagnosis present

## 2022-09-06 DIAGNOSIS — B961 Klebsiella pneumoniae [K. pneumoniae] as the cause of diseases classified elsewhere: Secondary | ICD-10-CM | POA: Insufficient documentation

## 2022-09-06 DIAGNOSIS — I639 Cerebral infarction, unspecified: Secondary | ICD-10-CM | POA: Diagnosis present

## 2022-09-06 DIAGNOSIS — D696 Thrombocytopenia, unspecified: Secondary | ICD-10-CM | POA: Diagnosis present

## 2022-09-06 DIAGNOSIS — I359 Nonrheumatic aortic valve disorder, unspecified: Secondary | ICD-10-CM | POA: Insufficient documentation

## 2022-09-06 DIAGNOSIS — R531 Weakness: Secondary | ICD-10-CM | POA: Diagnosis not present

## 2022-09-06 DIAGNOSIS — R42 Dizziness and giddiness: Secondary | ICD-10-CM | POA: Diagnosis present

## 2022-09-06 DIAGNOSIS — E7800 Pure hypercholesterolemia, unspecified: Secondary | ICD-10-CM | POA: Diagnosis not present

## 2022-09-06 DIAGNOSIS — I6200 Nontraumatic subdural hemorrhage, unspecified: Secondary | ICD-10-CM | POA: Diagnosis not present

## 2022-09-06 DIAGNOSIS — I480 Paroxysmal atrial fibrillation: Secondary | ICD-10-CM | POA: Diagnosis present

## 2022-09-06 DIAGNOSIS — I38 Endocarditis, valve unspecified: Secondary | ICD-10-CM | POA: Diagnosis present

## 2022-09-06 DIAGNOSIS — Z7982 Long term (current) use of aspirin: Secondary | ICD-10-CM | POA: Diagnosis not present

## 2022-09-06 DIAGNOSIS — R519 Headache, unspecified: Secondary | ICD-10-CM | POA: Diagnosis not present

## 2022-09-06 DIAGNOSIS — I70209 Unspecified atherosclerosis of native arteries of extremities, unspecified extremity: Secondary | ICD-10-CM | POA: Diagnosis present

## 2022-09-06 DIAGNOSIS — Z79899 Other long term (current) drug therapy: Secondary | ICD-10-CM | POA: Diagnosis not present

## 2022-09-06 LAB — CBC WITH DIFFERENTIAL/PLATELET
Abs Immature Granulocytes: 0.02 10*3/uL (ref 0.00–0.07)
Basophils Absolute: 0 10*3/uL (ref 0.0–0.1)
Basophils Relative: 1 %
Eosinophils Absolute: 0 10*3/uL (ref 0.0–0.5)
Eosinophils Relative: 1 %
HCT: 29.1 % — ABNORMAL LOW (ref 36.0–46.0)
Hemoglobin: 9.6 g/dL — ABNORMAL LOW (ref 12.0–15.0)
Immature Granulocytes: 1 %
Lymphocytes Relative: 46 %
Lymphs Abs: 1.7 10*3/uL (ref 0.7–4.0)
MCH: 31.9 pg (ref 26.0–34.0)
MCHC: 33 g/dL (ref 30.0–36.0)
MCV: 96.7 fL (ref 80.0–100.0)
Monocytes Absolute: 0.2 10*3/uL (ref 0.1–1.0)
Monocytes Relative: 6 %
Neutro Abs: 1.8 10*3/uL (ref 1.7–7.7)
Neutrophils Relative %: 45 %
Platelets: 54 10*3/uL — ABNORMAL LOW (ref 150–400)
RBC: 3.01 MIL/uL — ABNORMAL LOW (ref 3.87–5.11)
RDW: 13.8 % (ref 11.5–15.5)
WBC: 3.7 10*3/uL — ABNORMAL LOW (ref 4.0–10.5)
nRBC: 0 % (ref 0.0–0.2)

## 2022-09-06 LAB — BASIC METABOLIC PANEL
Anion gap: 10 (ref 5–15)
BUN: 34 mg/dL — ABNORMAL HIGH (ref 8–23)
CO2: 25 mmol/L (ref 22–32)
Calcium: 9.2 mg/dL (ref 8.9–10.3)
Chloride: 101 mmol/L (ref 98–111)
Creatinine, Ser: 0.87 mg/dL (ref 0.44–1.00)
GFR, Estimated: >60 mL/min (ref >60)
Glucose, Bld: 114 mg/dL — ABNORMAL HIGH (ref 70–99)
Potassium: 3.6 mmol/L (ref 3.5–5.1)
Sodium: 136 mmol/L (ref 135–145)

## 2022-09-06 LAB — RESP PANEL BY RT-PCR (RSV, FLU A&B, COVID)  RVPGX2
Influenza A by PCR: NEGATIVE
Influenza B by PCR: NEGATIVE
Resp Syncytial Virus by PCR: NEGATIVE
SARS Coronavirus 2 by RT PCR: NEGATIVE

## 2022-09-06 LAB — URINALYSIS, ROUTINE W REFLEX MICROSCOPIC
Bilirubin Urine: NEGATIVE
Glucose, UA: NEGATIVE mg/dL
Hgb urine dipstick: NEGATIVE
Ketones, ur: NEGATIVE mg/dL
Nitrite: NEGATIVE
Protein, ur: NEGATIVE mg/dL
Specific Gravity, Urine: 1.017 (ref 1.005–1.030)
pH: 5 (ref 5.0–8.0)

## 2022-09-06 MED ORDER — DIPHENHYDRAMINE HCL 50 MG/ML IJ SOLN
25.0000 mg | Freq: Once | INTRAMUSCULAR | Status: AC
  Administered 2022-09-06: 25 mg via INTRAVENOUS
  Filled 2022-09-06: qty 1

## 2022-09-06 MED ORDER — HYDRALAZINE HCL 20 MG/ML IJ SOLN: 5.0000 mg | Freq: Three times a day (TID) | INTRAMUSCULAR | Status: DC | PRN

## 2022-09-06 MED ORDER — ALPRAZOLAM 0.25 MG PO TABS: 0.1250 mg | ORAL_TABLET | Freq: Two times a day (BID) | ORAL | Status: DC | PRN

## 2022-09-06 MED ORDER — PROCHLORPERAZINE EDISYLATE 10 MG/2ML IJ SOLN
5.0000 mg | Freq: Once | INTRAMUSCULAR | Status: AC
  Administered 2022-09-06: 5 mg via INTRAVENOUS
  Filled 2022-09-06: qty 2

## 2022-09-06 MED ORDER — LORAZEPAM 2 MG/ML IJ SOLN
0.5000 mg | INTRAMUSCULAR | Status: DC | PRN
  Administered 2022-09-06: 0.5 mg via INTRAVENOUS
  Filled 2022-09-06: qty 1

## 2022-09-06 MED ORDER — FOSFOMYCIN TROMETHAMINE 3 G PO PACK
3.0000 g | PACK | Freq: Once | ORAL | Status: AC
  Administered 2022-09-06: 3 g via ORAL
  Filled 2022-09-06: qty 3

## 2022-09-06 MED ORDER — MECLIZINE HCL 25 MG PO TABS
25.0000 mg | ORAL_TABLET | Freq: Once | ORAL | Status: AC
  Administered 2022-09-06: 25 mg via ORAL
  Filled 2022-09-06: qty 1

## 2022-09-06 MED ORDER — METOPROLOL SUCCINATE ER 50 MG PO TB24
50.0000 mg | ORAL_TABLET | Freq: Every day | ORAL | Status: DC
  Administered 2022-09-06 – 2022-09-07 (×2): 50 mg via ORAL
  Filled 2022-09-06 (×2): qty 1

## 2022-09-06 MED ORDER — SENNOSIDES-DOCUSATE SODIUM 8.6-50 MG PO TABS: 1.0000 | ORAL_TABLET | Freq: Every evening | ORAL | Status: DC | PRN

## 2022-09-06 MED ORDER — ONDANSETRON HCL 4 MG PO TABS: 4.0000 mg | ORAL_TABLET | Freq: Four times a day (QID) | ORAL | Status: DC | PRN

## 2022-09-06 MED ORDER — ACETAMINOPHEN 325 MG PO TABS
650.0000 mg | ORAL_TABLET | Freq: Four times a day (QID) | ORAL | Status: DC | PRN
  Administered 2022-09-07: 650 mg via ORAL
  Filled 2022-09-06: qty 2

## 2022-09-06 MED ORDER — ONDANSETRON HCL 4 MG/2ML IJ SOLN: 4.0000 mg | Freq: Four times a day (QID) | INTRAMUSCULAR | Status: DC | PRN

## 2022-09-06 MED ORDER — SODIUM CHLORIDE 0.9 % IV BOLUS
500.0000 mL | Freq: Once | INTRAVENOUS | Status: AC
  Administered 2022-09-06: 500 mL via INTRAVENOUS

## 2022-09-06 MED ORDER — ASPIRIN 81 MG PO TBEC
81.0000 mg | DELAYED_RELEASE_TABLET | Freq: Every day | ORAL | Status: DC
  Administered 2022-09-07: 81 mg via ORAL
  Filled 2022-09-06: qty 1

## 2022-09-06 MED ORDER — ACETAMINOPHEN 650 MG RE SUPP: 650.0000 mg | Freq: Four times a day (QID) | RECTAL | Status: DC | PRN

## 2022-09-06 MED ORDER — ONDANSETRON 4 MG PO TBDP
4.0000 mg | ORAL_TABLET | Freq: Once | ORAL | Status: AC
  Administered 2022-09-06: 4 mg via ORAL
  Filled 2022-09-06: qty 1

## 2022-09-06 MED ORDER — SIMVASTATIN 20 MG PO TABS: 10.0000 mg | ORAL_TABLET | Freq: Every day | ORAL | Status: DC

## 2022-09-06 MED ORDER — ACETAMINOPHEN 500 MG PO TABS
1000.0000 mg | ORAL_TABLET | Freq: Once | ORAL | Status: AC
  Administered 2022-09-06: 1000 mg via ORAL
  Filled 2022-09-06: qty 2

## 2022-09-06 MED ORDER — SODIUM CHLORIDE 0.9 % IV SOLN
1.0000 g | INTRAVENOUS | Status: DC
  Administered 2022-09-07: 1 g via INTRAVENOUS
  Filled 2022-09-06: qty 1

## 2022-09-06 MED ORDER — ROPINIROLE HCL 0.25 MG PO TABS
0.2500 mg | ORAL_TABLET | Freq: Two times a day (BID) | ORAL | Status: DC
  Administered 2022-09-06 – 2022-09-07 (×2): 0.25 mg via ORAL
  Filled 2022-09-06 (×2): qty 1

## 2022-09-06 MED ORDER — MECLIZINE HCL 25 MG PO TABS: 25.0000 mg | ORAL_TABLET | Freq: Three times a day (TID) | ORAL | Status: DC | PRN

## 2022-09-06 NOTE — Assessment & Plan Note (Signed)
-   Fall precautions, PT, OT 

## 2022-09-06 NOTE — ED Notes (Signed)
Kasandra Knudsen, caregiver, [phone] leaving bedside. States she will be back to take patient home if discharged.

## 2022-09-06 NOTE — Assessment & Plan Note (Addendum)
Home scheduled antihypertensive medication metoprolol succinate 50 mg daily resumed on admission Hydralazine 5 mg IV every 8 hours as needed for SBP greater than 190, 36 hours ordered

## 2022-09-06 NOTE — Assessment & Plan Note (Signed)
-   Home alprazolam 0.125 mg p.o. twice daily as needed for anxiety and sleep resumed

## 2022-09-06 NOTE — ED Provider Notes (Signed)
[facility] Provider Note    Event Date/Time   First MD Initiated Contact with Patient 09/06/22 [phone]     (approximate)   History   Headache   HPI  Alicia Moran is a 89 y.o. female who presents to the ED for evaluation of Headache   I reviewed hematology clinic visit from 3/14.  History of MGUS and pancytopenia.  Received 1 unit of PRBCs on 3/14.  Patient presents to the ED for evaluation of intermittent dizziness and intermittent headaches since her blood transfusion 1 week ago.  She reports a history of vertigo for which she takes meclizine as needed.  She reports no syncopal episodes, falls or injuries.  No fevers or illnesses.  Denies weakness to the extremities or vision changes.  Does report episodes of feeling "normal" between these.  Reports taking meclizine prior to arrival with minimal improvement of her symptoms.  Physical Exam   Triage Vital Signs: ED Triage Vitals  Enc Vitals Group     BP      Pulse      Resp      Temp      Temp src      SpO2      Weight      Height      Head Circumference      Peak Flow      Pain Score      Pain Loc      Pain Edu?      Excl. in [place]?     Most recent vital signs: Vitals:   09/06/22 0530  BP: (!) 147/87  Pulse: 71  Resp: (!) 21  Temp: 98.3 F (36.8 C)  SpO2: 97%    General: Awake, no distress.  CV:  Good peripheral perfusion.  Resp:  Normal effort.  Abd:  No distention.  Minimal suprapubic tenderness, otherwise benign exam MSK:  No deformity noted.  Neuro:  No focal deficits appreciated. Cranial nerves II through XII intact 5/5 strength and sensation in all 4 extremities Other:     ED Results / Procedures / Treatments   Labs (all labs ordered are listed, but only abnormal results are displayed) Labs Reviewed  CBC WITH DIFFERENTIAL/PLATELET - Abnormal; Notable for the following components:      Result Value   WBC 3.7 (*)    RBC 3.01 (*)    Hemoglobin 9.6 (*)    HCT 29.1  (*)    Platelets 54 (*)    All other components within normal limits  BASIC METABOLIC PANEL - Abnormal; Notable for the following components:   Glucose, Bld 114 (*)    BUN 34 (*)    All other components within normal limits  URINALYSIS, ROUTINE W REFLEX MICROSCOPIC - Abnormal; Notable for the following components:   Color, Urine YELLOW (*)    APPearance HAZY (*)    Leukocytes,Ua TRACE (*)    Bacteria, UA MANY (*)    All other components within normal limits  RESP PANEL BY RT-PCR (RSV, FLU A&B, COVID)  [id number]  URINE CULTURE    EKG Sinus rhythm with a rate of 74 bpm.  Normal axis and intervals.  No clear signs of acute ischemia.  RADIOLOGY CT head interpreted by me without evidence of acute intracranial pathology  Official radiology report(s): CT HEAD WO CONTRAST (5MM)  Result Date: 09/06/2022 CLINICAL DATA:  89 year old female with headache, dizziness, nausea since 0300 hours. EXAM: CT HEAD WITHOUT CONTRAST TECHNIQUE: Contiguous axial images  were obtained from the base of the skull through the vertex without intravenous contrast. RADIATION DOSE REDUCTION: This exam was performed according to the departmental dose-optimization program which includes automated exposure control, adjustment of the mA and/or kV according to patient size and/or use of iterative reconstruction technique. COMPARISON:  Brain MRI 08/16/2009.  Head CT 06/14/2020. FINDINGS: Brain: Stable cerebral volume. No midline shift, ventriculomegaly, mass effect, evidence of mass lesion, intracranial hemorrhage or evidence of cortically based acute infarction. Chronic Patchy and confluent bilateral periventricular and central white matter hypodensity, not significantly changed. Some chronic internal capsule involvement appears stable since 2021. Vascular: Calcified atherosclerosis at the skull base. No suspicious intracranial vascular hyperdensity. Skull: Osteopenic skull base. Hyperostosis of the calvarium. No acute osseous  abnormality identified. Sinuses/Orbits: Visualized paranasal sinuses and mastoids are stable and well aerated. Other: Postoperative changes to both globes greater on the right. No acute orbit or scalp soft tissue finding. Some calcified scalp vessel atherosclerosis. IMPRESSION: 1. No acute intracranial abnormality. 2. Stable since 2021 non contrast CT appearance of chronic small vessel disease. Electronically Signed   By: Genevie Ann M.D.   On: 09/06/2022 06:08    PROCEDURES and INTERVENTIONS:  Procedures  Medications  meclizine (ANTIVERT) tablet 25 mg (has no administration in time range)  acetaminophen (TYLENOL) tablet 1,000 mg (1,000 mg Oral Given 09/06/22 0600)  ondansetron (ZOFRAN-ODT) disintegrating tablet 4 mg (4 mg Oral Given 09/06/22 0600)  fosfomycin (MONUROL) packet 3 g (3 g Oral Given 09/06/22 0656)     IMPRESSION / MDM / ASSESSMENT AND PLAN / ED COURSE  I reviewed the triage vital signs and the nursing notes.  Differential diagnosis includes, but is not limited to, viral illness such as COVID-19, symptomatic anemia, intracranial hemorrhage, BPPV, stroke  {Patient presents with symptoms of an acute illness or injury that is potentially life-threatening.  89 year old woman presents to the ED with acute on chronic intermittent vertigo alongside headache.  She has signs of acute cystitis and chronic pancytopenia.  Normal metabolic panel.  CT head is reassuring.  Awaiting viral swabs and reassessment with ambulation trial after meclizine.  Signed out to oncoming provider.      FINAL CLINICAL IMPRESSION(S) / ED DIAGNOSES   Final diagnoses:  Bad headache  Dizziness     Rx / DC Orders   ED Discharge Orders     None        Note:  This document was prepared using Dragon voice recognition software and may include unintentional dictation errors.   Vladimir Crofts, MD 09/06/22 [id number]

## 2022-09-06 NOTE — Assessment & Plan Note (Addendum)
With headache MRI was read as possible right paramedian pons infarct Stroke-like symptoms Neurology has been consulted and reviewed the imaging and does not feel that the right paramedian pontine has an infarct and that it is an artifact Complete echo can be ordered if neurology believes there is a stroke Frequent neuro vascular checks PT, OT Fall precaution

## 2022-09-06 NOTE — ED Notes (Signed)
Pt called out stating she feels like she has restless leg, MD notified.

## 2022-09-06 NOTE — Progress Notes (Signed)
Neurology was contacted by Hospitalist team to review the MRI images for second opinion.   The tiny focus of faintly hyperintense DWI signal on one axial slice of the DWI images does not fill MRI criteria for a stroke, as there is no corresponding signal drop on the ADC map at the same location. Additionally, there is no correlate on the coronal DWI images. Finally, there is T2-hyperintense signal on FLAIR and T2-weighted images at this location, consistent with an old lesion, most likely secondary to chronic microvascular ischemic change, which is also widespread elsewhere infratentorially and supratentorially. Overall impression is that this finding most likely reflects incidental T2-shine through artifact from a chronic lesion.  Electronically signed: Dr. Kerney Elbe

## 2022-09-06 NOTE — ED Notes (Signed)
Pt to MRI

## 2022-09-06 NOTE — H&P (Addendum)
History and Physical   Alicia Moran ZOX:[id number] DOB: 1929/05/30 DOA: 09/06/2022  PCP: [name], MD  Outpatient Specialists: Dr. Clelia Croft, neurology Patient coming from: Home via EMS  I have personally briefly reviewed patient's old medical records in [facility] EMR.  Chief Concern: Dizziness and headache  HPI: Ms. Alicia Moran is a 89 year old female with history of hypertension, hyperlipidemia, restless leg syndrome, anxiety who presents to emergency department for chief concerns of headache and dizziness.  Vitals in the ED showed temperature of 98.2, respiration rate of 17, heart rate of 70, blood pressure 122/84, SpO2 of 96% on room air.  Serum sodium is 136, potassium 3.6, chloride 101, bicarb 25, BUN of 34, serum creatinine of 0.87, EGFR greater than 60, nonfasting blood glucose 114, WBC 3.7, platelets of 514, hemoglobin 9.6.  COVID/influenza A/influenza B/RSV PCR were negative. UA was positive for trace leukocytes.  ED treatment: Acetaminophen 1 g p.o. one-time dose, diphenhydramine 25 mg IV x 2, fosfomycin, meclizine, ondansetron 4 mg p.o., Compazine 5 mg IV, Ativan 0.5 mg IV one-time dose.  Sodium chloride 500 mL bolus. ------------------------------ At bedside, she was able to tell me her name, age, location, current calendar year.  She does not appear to be in acute distress.  She reports that the dizziness and headaches have improved.  She reports the nausea has improved with medications.  She reports that over the last 1 to 2 weeks she has had increased number of urination.  She denies dysuria, hematuria, diarrhea, chest pain, shortness of breath, abdominal pain, fever, chills, swelling of her legs, syncope.  Social history: Patient lives at home alone.  She denies tobacco, EtOH, recreational drug use.  ROS: Constitutional: no weight change, no fever ENT/Mouth: no sore throat, no rhinorrhea Eyes: no eye pain, no vision changes Cardiovascular: no chest pain,  no dyspnea,  no edema, no palpitations Respiratory: no cough, no sputum, no wheezing Gastrointestinal: no nausea, no vomiting, no diarrhea, no constipation Genitourinary: no urinary incontinence, no dysuria, no hematuria Musculoskeletal: no arthralgias, no myalgias Skin: no skin lesions, no pruritus, Neuro: + weakness, no loss of consciousness, no syncope, + dizziness Psych: no anxiety, no depression, no decrease appetite Heme/Lymph: no bruising, no bleeding  ED Course: Discussed with emergency medicine provider, patient requiring hospitalization for chief concerns of right paramedian infarct.  Assessment/Plan  Principal Problem:   Dizziness Active Problems:   Pyuria   Weakness   Paroxysmal atrial fibrillation (HCC)   High cholesterol   Adjustment disorder with mixed anxiety and depressed mood   Iron deficiency anemia due to chronic blood loss   Atherosclerotic peripheral vascular disease (HCC)   Neuropathy   Constipation   Valvular heart disease   Monoclonal gammopathy of unknown significance (MGUS)   H/O: CVA (cerebrovascular accident)   HTN (hypertension)   Subdural hematoma (HCC)   Malnutrition of moderate degree   CVA (cerebral vascular accident) (HCC)   Assessment and Plan:  * Dizziness With headache MRI was read as possible right paramedian pons infarct Stroke-like symptoms Neurology has been consulted and reviewed the imaging and does not feel that the right paramedian pontine has an infarct and that it is an artifact Complete echo can be ordered if neurology believes there is a stroke Frequent neuro vascular checks PT, OT Fall precaution   Pyuria Present on admission UTI Given patient presenting with changes in balance and dizziness, UTI cannot be excluded at this time Patient is status post fosfomycin 3 g p.o. one-time dose per  EDP Will give sodium chloride 500 mL bolus Ceftriaxone 1 g daily, starting on 09/07/2022, 2 doses ordered  Weakness Fall  precautions, PT, OT  HTN (hypertension) Home scheduled antihypertensive medication metoprolol succinate 50 mg daily resumed on admission Hydralazine 5 mg IV every 8 hours as needed for SBP greater than 190, 36 hours ordered  Adjustment disorder with mixed anxiety and depressed mood - Home alprazolam 0.125 mg p.o. twice daily as needed for anxiety and sleep resumed  Paroxysmal atrial fibrillation (HCC) Metoprolol succinate 50 mg daily resumed  Chart reviewed.   DVT prophylaxis: TED hose Code Status: Full code, per patient at bedside Diet: Heart healthy Family Communication: called sister Donney Rankins, at [phone] Disposition Plan: Pending clinical course and neurology evaluation Consults called: Neurology Admission status: Telemetry medical, inpatient  Past Medical History:  Diagnosis Date   Hyperlipidemia    Migraine headache    Stroke [facility])    History reviewed. No pertinent surgical history.  Social History:  reports that she quit smoking about 36 years ago. Her smoking use included cigarettes. She has never used smokeless tobacco. She reports that she does not drink alcohol and does not use drugs.  Allergies  Allergen Reactions   Erythromycin Diarrhea   Nsaids Other (See Comments)    Other Reaction: GI ulcers   Family History  Problem Relation Age of Onset   Hypertension Mother    Diabetes Mother    Hypertension Father    Family history: Family history reviewed and not pertinent  Prior to Admission medications   Medication Sig Start Date End Date Taking? Authorizing Provider  acetaminophen (TYLENOL) 500 MG tablet Take 500-1,000 mg by mouth every 6 (six) hours as needed (pain).   Yes [provider]  ALPRAZolam (XANAX) 0.25 MG tablet Take 0.5 tablets (0.125 mg total) by mouth 2 (two) times daily as needed for anxiety or sleep. 12/26/21  Yes Lurene Shadow, MD  aspirin 81 MG tablet Take 81 mg by mouth daily.   Yes [provider]   Cholecalciferol 50 MCG (2000 UT) CAPS Take 2,000 Units by mouth daily.   Yes [provider]  Coenzyme Q10 (COQ10) 50 MG CAPS Take 50 mg by mouth daily.   Yes [provider]  famotidine (PEPCID) 10 MG tablet Take 10 mg by mouth daily as needed for heartburn or indigestion.   Yes [provider]  glucosamine-chondroitin 500-400 MG tablet Take 1 tablet by mouth as directed.   Yes [provider]  meclizine (ANTIVERT) 25 MG tablet Take 25 mg by mouth 3 (three) times daily as needed. 12/05/21  Yes [provider]  metoprolol succinate (TOPROL-XL) 50 MG 24 hr tablet TAKE 1 TABLET EVERY DAY 08/06/22  Yes [name], MD  Multiple Vitamins-Minerals (PRESERVISION AREDS 2+MULTI VIT PO) Take 1 tablet by mouth daily.   Yes [provider]  rOPINIRole (REQUIP) 0.25 MG tablet Take 1 tablet (0.25 mg total) by mouth 2 (two) times daily. 12/26/21  Yes Lurene Shadow, MD  simvastatin (ZOCOR) 10 MG tablet TAKE 1 TABLET AT BEDTIME 12/04/21  Yes [name], MD  polyethylene glycol (MIRALAX / GLYCOLAX) 17 g packet Take 17 g by mouth daily as needed. Patient not taking: Reported on 09/06/2022 02/19/22   Hollice Espy, MD   Physical Exam: Vitals:   09/06/22 1300 09/06/22 1330 09/06/22 1400 09/06/22 1430  BP: 122/74 117/77 109/73 101/71  Pulse: 61 68 69 71  Resp: 19 16 (!) 23 18  Temp:  TempSrc:      SpO2: 95% 96% 95% 97%  Weight:      Height:       Constitutional: appears age-appropriate, frail, NAD, calm, comfortable Eyes: PERRL, lids and conjunctivae normal.  Patient has left eye droopiness, per patient this is her baseline and not new. ENMT: Mucous membranes are moist. Posterior pharynx clear of any exudate or lesions. Age-appropriate dentition. Hearing appropriate Neck: normal, supple, no masses, no thyromegaly Respiratory: clear to auscultation bilaterally, no wheezing, no crackles. Normal respiratory effort. No accessory muscle use.   Cardiovascular: Regular rate and rhythm, no murmurs / rubs / gallops. No extremity edema. 2+ pedal pulses. No carotid bruits.  Abdomen: no tenderness, no masses palpated, no hepatosplenomegaly. Bowel sounds positive.  Musculoskeletal: no clubbing / cyanosis. No joint deformity upper and lower extremities. Good ROM, no contractures, no atrophy. Normal muscle tone.  Skin: no rashes, lesions, ulcers. No induration Neurologic: Sensation intact. Strength 5/5 in all 4.  Psychiatric: Normal judgment and insight. Alert and oriented x 3. Normal mood.   EKG: independently reviewed, showing sinus rhythm with rate of 74, QTc 442  Chest x-ray on Admission: I personally reviewed and I agree with radiologist reading as below.  MR BRAIN WO CONTRAST  Result Date: 09/06/2022 CLINICAL DATA:  Provided history: Neuro deficit, acute, stroke suspected. Dizziness/vertigo. Headaches. EXAM: MRI HEAD WITHOUT CONTRAST TECHNIQUE: Multiplanar, multiecho pulse sequences of the brain and surrounding structures were obtained without intravenous contrast. COMPARISON:  Head CT 09/06/2022.  Brain MRI 08/16/2009. FINDINGS: Brain: Mild generalized parenchymal atrophy. Apparent 5 mm focus of restricted diffusion within the right paramedian aspect of the pons appreciated on the axial diffusion-weighted sequence only (series 5, image 14). This is not appreciated on the coronal diffusion-weighted sequence, and may reflect an acute infarct or artifact. Multifocal T2 FLAIR hyperintense signal abnormality within the cerebral white matter, nonspecific but compatible with moderate for age chronic small vessel ischemic disease. Mild-to-moderate chronic small vessel changes within the pons. Chronic lacunar infarct (with associated chronic blood products) in the he knew of the right internal capsule, new from the prior MRI of 08/16/2009. Redemonstrated chronic lacunar infarct within the left thalamus. Tiny chronic infarct within the right cerebellar  hemisphere, new from the prior MRI. Chronic microhemorrhage within the left cerebellar hemisphere. No evidence of an intracranial mass. No extra-axial fluid collection. No midline shift. Vascular: Maintained flow voids within the proximal large arterial vessels. Skull and upper cervical spine: No focal suspicious marrow lesion. Generalized abnormal T1 hypointense marrow signal within the calvarium and visualized upper cervical spine. Sinuses/Orbits: No mass or acute finding within the imaged orbits. Prior bilateral ocular lens replacement. Minimal mucosal thickening within the left frontal sinus. IMPRESSION: 1. 5 mm focus of apparent restricted diffusion within the right paramedian aspect of the pons, appreciated on the axial diffusion-weighted sequence only. This may reflect an acute infarct or artifact. 2. Background chronic small vessel ischemic changes, as described and progressed from the prior brain MRI of 08/16/2009. 3. Mild generalized parenchymal atrophy. 4. Generalized abnormal T1 hypointense marrow signal within the calvarium and visualized cervical spine. While these findings can reflect a marrow infiltrative process, the most common causes include chronic anemia, smoking and obesity. Electronically Signed   By: Jackey Loge D.O.   On: 09/06/2022 11:55   CT HEAD WO CONTRAST ( )  Result Date: 09/06/2022 CLINICAL DATA:  89 year old female with headache, dizziness, nausea since 0300 hours. EXAM: CT HEAD WITHOUT CONTRAST TECHNIQUE: Contiguous axial images were obtained from the  base of the skull through the vertex without intravenous contrast. RADIATION DOSE REDUCTION: This exam was performed according to the departmental dose-optimization program which includes automated exposure control, adjustment of the mA and/or kV according to patient size and/or use of iterative reconstruction technique. COMPARISON:  Brain MRI 08/16/2009.  Head CT 06/14/2020. FINDINGS: Brain: Stable cerebral volume. No midline  shift, ventriculomegaly, mass effect, evidence of mass lesion, intracranial hemorrhage or evidence of cortically based acute infarction. Chronic Patchy and confluent bilateral periventricular and central white matter hypodensity, not significantly changed. Some chronic internal capsule involvement appears stable since 2021. Vascular: Calcified atherosclerosis at the skull base. No suspicious intracranial vascular hyperdensity. Skull: Osteopenic skull base. Hyperostosis of the calvarium. No acute osseous abnormality identified. Sinuses/Orbits: Visualized paranasal sinuses and mastoids are stable and well aerated. Other: Postoperative changes to both globes greater on the right. No acute orbit or scalp soft tissue finding. Some calcified scalp vessel atherosclerosis. IMPRESSION: 1. No acute intracranial abnormality. 2. Stable since 2021 non contrast CT appearance of chronic small vessel disease. Electronically Signed   By: Odessa Fleming M.D.   On: 09/06/2022 06:08    Labs on Admission: I have personally reviewed following labs  CBC: Recent Labs  Lab 09/06/22 0538  WBC 3.7*  NEUTROABS 1.8  HGB 9.6*  HCT 29.1*  MCV 96.7  PLT 54*   Basic Metabolic Panel: Recent Labs  Lab 09/06/22 0538  NA 136  K 3.6  CL 101  CO2 25  GLUCOSE 114*  BUN 34*  CREATININE 0.87  CALCIUM 9.2   GFR: Estimated Creatinine Clearance: 33.3 mL/min (by C-G formula based on SCr of 0.87 mg/dL).  Urine analysis:    Component Value Date/Time   COLORURINE YELLOW (A) 09/06/2022 0633   APPEARANCEUR HAZY (A) 09/06/2022 0633   APPEARANCEUR Clear 06/10/2014 2358   LABSPEC 1.017 09/06/2022 0633   LABSPEC 1.023 06/10/2014 2358   PHURINE 5.0 09/06/2022 0633   GLUCOSEU NEGATIVE 09/06/2022 0633   GLUCOSEU Negative 06/10/2014 2358   HGBUR NEGATIVE 09/06/2022 0633   BILIRUBINUR NEGATIVE 09/06/2022 0633   BILIRUBINUR negative 01/17/2017 1058   BILIRUBINUR Negative 06/10/2014 2358   KETONESUR NEGATIVE 09/06/2022 0633   PROTEINUR  NEGATIVE 09/06/2022 0633   UROBILINOGEN 0.2 01/17/2017 1058   NITRITE NEGATIVE 09/06/2022 0633   LEUKOCYTESUR TRACE (A) 09/06/2022 0633   LEUKOCYTESUR 3+ 06/10/2014 2358   This document was prepared using Dragon Voice Recognition software and may include unintentional dictation errors.  Dr. Sedalia Muta [facility]  If 7PM-7AM, please contact overnight-coverage provider If 7AM-7PM, please contact day coverage provider [email or web address]  09/06/2022, 3:07 PM

## 2022-09-06 NOTE — Assessment & Plan Note (Signed)
-   Metoprolol succinate 50 mg daily resumed °

## 2022-09-06 NOTE — ED Notes (Signed)
Patient in CT at this time.

## 2022-09-06 NOTE — ED Triage Notes (Signed)
Patient arrived via EMS from home reporting headache, dizziness and nausea since 0300 today. Denies recent falls. States she took meclizine with no relief. Speaking in full clear sentences. No focal weakness noted. States head pain has improved but reports continued nausea.

## 2022-09-06 NOTE — Hospital Course (Signed)
Ms. Mollyann Barrozo is a 89 year old female with history of hypertension, hyperlipidemia, restless leg syndrome, anxiety who presents to emergency department for chief concerns of headache and dizziness.  Vitals in the ED showed temperature of 98.2, respiration rate of 17, heart rate of 70, blood pressure 122/84, SpO2 of 96% on room air.  Serum sodium is 136, potassium 3.6, chloride 101, bicarb 25, BUN of 34, serum creatinine of 0.87, EGFR greater than 60, nonfasting blood glucose 114, WBC 3.7, platelets of 514, hemoglobin 9.6.  COVID/influenza A/influenza B/RSV PCR were negative. UA was positive for trace leukocytes.  ED treatment: Acetaminophen 1 g p.o. one-time dose, diphenhydramine 25 mg IV x 2, fosfomycin, meclizine, ondansetron 4 mg p.o., Compazine 5 mg IV, Ativan 0.5 mg IV one-time dose.  Sodium chloride 500 mL bolus.

## 2022-09-06 NOTE — ED Provider Notes (Signed)
-----------------------------------------   7:09 AM on 09/06/2022 -----------------------------------------  Blood pressure (!) 147/87, pulse 71, temperature 98.3 F (36.8 C), temperature source Oral, resp. rate (!) 21, height 5\' 2"  (1.575 m), weight 54.9 kg, SpO2 97 %.  Assuming care from Dr. Tamala Julian.  In short, Alicia Moran is a 89 y.o. female with a chief complaint of Headache .  Refer to the original H&P for additional details.  The current plan of care is to follow-up COVID results and reassess following meclizine for dizziness.  ----------------------------------------- 1:01 PM on 09/06/2022 ----------------------------------------- Patient continued to complain of significant dizziness despite dose of meclizine, also complains of headache.  She was given Compazine and Benadryl with no improvement in symptoms, states that meclizine typically works well for her headaches.  She was able to stand on her own but still reports nonspecific dizziness.  MRI was performed and shows possible acute infarct in the pons.  Given this finding as well as UTI, case discussed with hospitalist for admission.    Blake Divine, MD 09/06/22 1302

## 2022-09-06 NOTE — Assessment & Plan Note (Addendum)
Present on admission UTI Given patient presenting with changes in balance and dizziness, UTI cannot be excluded at this time Patient is status post fosfomycin 3 g p.o. one-time dose per EDP Will give sodium chloride 500 mL bolus Ceftriaxone 1 g daily, starting on 09/07/2022, 2 doses ordered

## 2022-09-06 NOTE — ED Notes (Signed)
This RN at bedside, assisted pt to standing position with walker at bedside. Pt able to assist herself out of stretcher to stand and take several steps with walker without assistance. Pt states she feels okay walking, but expresses worry about going home and having to come back if she is still not feeling well. Pt back to stretcher.  MD notified.

## 2022-09-06 NOTE — ED Notes (Signed)
Patient was incontinent to urine. Patient was cleaned and linens were changed. Warm blankets provided. Patient is sitting up in bed eating dinner.

## 2022-09-07 DIAGNOSIS — R42 Dizziness and giddiness: Secondary | ICD-10-CM

## 2022-09-07 DIAGNOSIS — K529 Noninfective gastroenteritis and colitis, unspecified: Secondary | ICD-10-CM | POA: Diagnosis not present

## 2022-09-07 DIAGNOSIS — E871 Hypo-osmolality and hyponatremia: Secondary | ICD-10-CM | POA: Insufficient documentation

## 2022-09-07 DIAGNOSIS — R8281 Pyuria: Secondary | ICD-10-CM

## 2022-09-07 LAB — CBC
HCT: 26.4 % — ABNORMAL LOW (ref 36.0–46.0)
Hemoglobin: 8.9 g/dL — ABNORMAL LOW (ref 12.0–15.0)
MCH: 32.4 pg (ref 26.0–34.0)
MCHC: 33.7 g/dL (ref 30.0–36.0)
MCV: 96 fL (ref 80.0–100.0)
Platelets: 52 10*3/uL — ABNORMAL LOW (ref 150–400)
RBC: 2.75 MIL/uL — ABNORMAL LOW (ref 3.87–5.11)
RDW: 13.6 % (ref 11.5–15.5)
WBC: 4.1 10*3/uL (ref 4.0–10.5)
nRBC: 0 % (ref 0.0–0.2)

## 2022-09-07 LAB — BASIC METABOLIC PANEL
Anion gap: 5 (ref 5–15)
BUN: 29 mg/dL — ABNORMAL HIGH (ref 8–23)
CO2: 26 mmol/L (ref 22–32)
Calcium: 8.7 mg/dL — ABNORMAL LOW (ref 8.9–10.3)
Chloride: 103 mmol/L (ref 98–111)
Creatinine, Ser: 0.79 mg/dL (ref 0.44–1.00)
GFR, Estimated: >60 mL/min (ref >60)
Glucose, Bld: 93 mg/dL (ref 70–99)
Potassium: 4.3 mmol/L (ref 3.5–5.1)
Sodium: 134 mmol/L — ABNORMAL LOW (ref 135–145)

## 2022-09-07 MED ORDER — SODIUM CHLORIDE 0.9 % IV SOLN: INTRAVENOUS | Status: DC | PRN

## 2022-09-07 MED ORDER — TROLAMINE SALICYLATE 10 % EX CREA
TOPICAL_CREAM | Freq: Two times a day (BID) | CUTANEOUS | Status: DC | PRN
  Filled 2022-09-07: qty 85

## 2022-09-07 MED ORDER — FOSFOMYCIN TROMETHAMINE 3 G PO PACK: 3.0000 g | PACK | Freq: Once | ORAL | Status: DC

## 2022-09-07 NOTE — Care Management CC44 (Signed)
Condition Code 44 Documentation Completed  Patient Details  Name: Alicia Moran MRN: [id number] Date of Birth: 12/17/1928   Condition Code 44 given:  Yes Patient signature on Condition Code 44 notice:  Yes Documentation of 2 MD's agreement:  Yes Code 44 added to claim:  Yes    Candie Chroman, LCSW 09/07/2022, 12:38 PM

## 2022-09-07 NOTE — Discharge Summary (Signed)
Physician Discharge Summary   Patient: Alicia Moran MRN: [id number] DOB: 07-14-1928  Admit date:     09/06/2022  Discharge date: 09/07/22  Discharge Physician: Marrion Coy   PCP: [name], MD   Recommendations at discharge:   Follow-up with PCP in 1 week.  Discharge Diagnoses: Principal Problem:   Dizziness Active Problems:   Pyuria   Weakness   Paroxysmal atrial fibrillation (HCC)   High cholesterol   Adjustment disorder with mixed anxiety and depressed mood   Iron deficiency anemia due to chronic blood loss   Atherosclerotic peripheral vascular disease (HCC)   Neuropathy   Constipation   Valvular heart disease   Thrombocytopenia (HCC)   Monoclonal gammopathy of unknown significance (MGUS)   H/O: CVA (cerebrovascular accident)   HTN (hypertension)   Subdural hematoma (HCC)   Malnutrition of moderate degree   CVA (cerebral vascular accident) (HCC)   Gastroenteritis   Hyponatremia Chronic pancytopenia secondary to MGUS. Resolved Problems:   * No resolved hospital problems. [facility] Course: Ms. Alicia Moran is a 89 year old female with history of hypertension, hyperlipidemia, restless leg syndrome, anxiety who presents to emergency department for chief concerns of headache and dizziness. Patient had a blood transfusion about a week ago, she has been feeling dizzy since then.  She also had significant nausea vomiting, had 2 loose stools prior to admission.  Her condition appears to be secondary to gastroenteritis with dehydration, she received fluids.  Her dizziness is much better today. MRI showed a pontine lesion, suspected stroke.  Reviewed images with neurology Dr. Otelia Limes, this is not consistent with acute stroke.  No need for completion of treatment. Discussed with patient caretaker, patient currently lives at home with caretaker at night.  She was able to ambulate normally at home.  She has been seen by Occupational Therapy, no need for follow-up, PT  will be seeing her before discharge.  Assessment and Plan: * Dizziness Gastroenteritis. Mild hyponatremia. Stroke has been ruled out, patient has GI symptoms prior to admission, probably has dehydration.  Condition has improved today.  No need for additional workup or treatment.  Pyuria Patient has trace white cells in the urine, negative nitrates, condition not consistent with UTI.  Patient has been given a dose of fosfomycin, no additional antibiotics needed.  I spoke with microbiology lab, patient has low counts of gram-negative rods.  Patient also had a history of C. difficile, at her condition, the risk of additional antibiotic overweigh the benefit.  Weakness Seen by OT, no need to follow-up.  Patient was able to ambulate with a walker at home.  HTN (hypertension) Resume beta-blocker.  Adjustment disorder with mixed anxiety and depressed mood Resume home medicine.  Paroxysmal atrial fibrillation (HCC) Metoprolol succinate 50 mg daily resumed        Consultants: None Procedures performed: None  Disposition: Home Diet recommendation:  Discharge Diet Orders (From admission, onward)     Start     Ordered   09/07/22 0000  Diet - low sodium heart healthy        09/07/22 1158           Cardiac diet DISCHARGE MEDICATION: Allergies as of 09/07/2022       Reactions   Erythromycin Diarrhea   Nsaids Other (See Comments)   Other Reaction: GI ulcers        Medication List     STOP taking these medications    polyethylene glycol 17 g packet Commonly known as: MIRALAX /  GLYCOLAX       TAKE these medications    acetaminophen 500 MG tablet Commonly known as: TYLENOL Take 500-1,000 mg by mouth every 6 (six) hours as needed (pain).   ALPRAZolam 0.25 MG tablet Commonly known as: XANAX Take 0.5 tablets (0.125 mg total) by mouth 2 (two) times daily as needed for anxiety or sleep.   aspirin 81 MG tablet Take 81 mg by mouth daily.   Cholecalciferol 50 MCG  (2000 UT) Caps Take 2,000 Units by mouth daily.   CoQ10 50 MG Caps Take 50 mg by mouth daily.   famotidine 10 MG tablet Commonly known as: PEPCID Take 10 mg by mouth daily as needed for heartburn or indigestion.   glucosamine-chondroitin 500-400 MG tablet Take 1 tablet by mouth as directed.   meclizine 25 MG tablet Commonly known as: ANTIVERT Take 25 mg by mouth 3 (three) times daily as needed.   metoprolol succinate 50 MG 24 hr tablet Commonly known as: TOPROL-XL TAKE 1 TABLET EVERY DAY   PRESERVISION AREDS 2+MULTI VIT PO Take 1 tablet by mouth daily.   rOPINIRole 0.25 MG tablet Commonly known as: REQUIP Take 1 tablet (0.25 mg total) by mouth 2 (two) times daily.   simvastatin 10 MG tablet Commonly known as: ZOCOR TAKE 1 TABLET AT BEDTIME        Follow-up Information     [name], MD Follow up in 1 week(s).   Specialty: Internal Medicine Contact information: [address]   [place] [address] [phone]                Discharge Exam: Alicia Moran Weights   09/06/22 0531 09/06/22 2044  Weight: 54.9 kg 57.3 kg   General exam: Appears calm and comfortable  Respiratory system: Clear to auscultation. Respiratory effort normal. Cardiovascular system: S1 & S2 heard, RRR. No JVD, murmurs, rubs, gallops or clicks. No pedal edema. Gastrointestinal system: Abdomen is nondistended, soft and nontender. No organomegaly or masses felt. Normal bowel sounds heard. Central nervous system: Alert and oriented. No focal neurological deficits. Extremities: Symmetric 5 x 5 power. Skin: No rashes, lesions or ulcers Psychiatry: Judgement and insight appear normal. Mood & affect appropriate.    Condition at discharge: good  The results of significant diagnostics from this hospitalization (including imaging, microbiology, ancillary and laboratory) are listed below for reference.   Imaging Studies: MR BRAIN WO CONTRAST  Result Date: 09/06/2022 CLINICAL DATA:   Provided history: Neuro deficit, acute, stroke suspected. Dizziness/vertigo. Headaches. EXAM: MRI HEAD WITHOUT CONTRAST TECHNIQUE: Multiplanar, multiecho pulse sequences of the brain and surrounding structures were obtained without intravenous contrast. COMPARISON:  Head CT 09/06/2022.  Brain MRI 08/16/2009. FINDINGS: Brain: Mild generalized parenchymal atrophy. Apparent 5 mm focus of restricted diffusion within the right paramedian aspect of the pons appreciated on the axial diffusion-weighted sequence only (series 5, image 14). This is not appreciated on the coronal diffusion-weighted sequence, and may reflect an acute infarct or artifact. Multifocal T2 FLAIR hyperintense signal abnormality within the cerebral white matter, nonspecific but compatible with moderate for age chronic small vessel ischemic disease. Mild-to-moderate chronic small vessel changes within the pons. Chronic lacunar infarct (with associated chronic blood products) in the he knew of the right internal capsule, new from the prior MRI of 08/16/2009. Redemonstrated chronic lacunar infarct within the left thalamus. Tiny chronic infarct within the right cerebellar hemisphere, new from the prior MRI. Chronic microhemorrhage within the left cerebellar hemisphere. No evidence of an intracranial mass. No extra-axial  fluid collection. No midline shift. Vascular: Maintained flow voids within the proximal large arterial vessels. Skull and upper cervical spine: No focal suspicious marrow lesion. Generalized abnormal T1 hypointense marrow signal within the calvarium and visualized upper cervical spine. Sinuses/Orbits: No mass or acute finding within the imaged orbits. Prior bilateral ocular lens replacement. Minimal mucosal thickening within the left frontal sinus. IMPRESSION: 1. 5 mm focus of apparent restricted diffusion within the right paramedian aspect of the pons, appreciated on the axial diffusion-weighted sequence only. This may reflect an acute  infarct or artifact. 2. Background chronic small vessel ischemic changes, as described and progressed from the prior brain MRI of 08/16/2009. 3. Mild generalized parenchymal atrophy. 4. Generalized abnormal T1 hypointense marrow signal within the calvarium and visualized cervical spine. While these findings can reflect a marrow infiltrative process, the most common causes include chronic anemia, smoking and obesity. Electronically Signed   By: Jackey Loge D.O.   On: 09/06/2022 11:55   CT HEAD WO CONTRAST ( )  Result Date: 09/06/2022 CLINICAL DATA:  89 year old female with headache, dizziness, nausea since 0300 hours. EXAM: CT HEAD WITHOUT CONTRAST TECHNIQUE: Contiguous axial images were obtained from the base of the skull through the vertex without intravenous contrast. RADIATION DOSE REDUCTION: This exam was performed according to the departmental dose-optimization program which includes automated exposure control, adjustment of the mA and/or kV according to patient size and/or use of iterative reconstruction technique. COMPARISON:  Brain MRI 08/16/2009.  Head CT 06/14/2020. FINDINGS: Brain: Stable cerebral volume. No midline shift, ventriculomegaly, mass effect, evidence of mass lesion, intracranial hemorrhage or evidence of cortically based acute infarction. Chronic Patchy and confluent bilateral periventricular and central white matter hypodensity, not significantly changed. Some chronic internal capsule involvement appears stable since 2021. Vascular: Calcified atherosclerosis at the skull base. No suspicious intracranial vascular hyperdensity. Skull: Osteopenic skull base. Hyperostosis of the calvarium. No acute osseous abnormality identified. Sinuses/Orbits: Visualized paranasal sinuses and mastoids are stable and well aerated. Other: Postoperative changes to both globes greater on the right. No acute orbit or scalp soft tissue finding. Some calcified scalp vessel atherosclerosis. IMPRESSION: 1. No  acute intracranial abnormality. 2. Stable since 2021 non contrast CT appearance of chronic small vessel disease. Electronically Signed   By: Odessa Fleming M.D.   On: 09/06/2022 06:08    Microbiology: Results for orders placed or performed during the hospital encounter of 09/06/22  Resp panel by RT-PCR (RSV, Flu A&B, Covid) Anterior Nasal Swab     Status: None   Collection Time: 09/06/22  6:33 AM   Specimen: Anterior Nasal Swab  Result Value Ref Range Status   SARS Coronavirus 2 by RT PCR NEGATIVE NEGATIVE Final    Comment: (NOTE) SARS-CoV-2 target nucleic acids are NOT DETECTED.  The SARS-CoV-2 RNA is generally detectable in upper respiratory specimens during the acute phase of infection. The lowest concentration of SARS-CoV-2 viral copies this assay can detect is 138 copies/mL. A negative result does not preclude SARS-Cov-2 infection and should not be used as the sole basis for treatment or other patient management decisions. A negative result may occur with  improper specimen collection/handling, submission of specimen other than nasopharyngeal swab, presence of viral mutation(s) within the areas targeted by this assay, and inadequate number of viral copies(<138 copies/mL). A negative result must be combined with clinical observations, patient history, and epidemiological information. The expected result is Negative.  Fact Sheet for Patients:  BloggerCourse.com  Fact Sheet for Healthcare Providers:  SeriousBroker.it  This test is no  t yet approved or cleared by the [place] and  has been authorized for detection and/or diagnosis of SARS-CoV-2 by FDA under an Emergency Use Authorization (EUA). This EUA will remain  in effect (meaning this test can be used) for the duration of the COVID-19 declaration under Section 564(b)(1) of the Act, 21 U.S.C.section 360bbb-3(b)(1), unless the authorization is terminated  or revoked sooner.        Influenza A by PCR NEGATIVE NEGATIVE Final   Influenza B by PCR NEGATIVE NEGATIVE Final    Comment: (NOTE) The Xpert Xpress SARS-CoV-2/FLU/RSV plus assay is intended as an aid in the diagnosis of influenza from Nasopharyngeal swab specimens and should not be used as a sole basis for treatment. Nasal washings and aspirates are unacceptable for Xpert Xpress SARS-CoV-2/FLU/RSV testing.  Fact Sheet for Patients: BloggerCourse.com  Fact Sheet for Healthcare Providers: SeriousBroker.it  This test is not yet approved or cleared by the [place] FDA and has been authorized for detection and/or diagnosis of SARS-CoV-2 by FDA under an Emergency Use Authorization (EUA). This EUA will remain in effect (meaning this test can be used) for the duration of the COVID-19 declaration under Section 564(b)(1) of the Act, 21 U.S.C. section 360bbb-3(b)(1), unless the authorization is terminated or revoked.     Resp Syncytial Virus by PCR NEGATIVE NEGATIVE Final    Comment: (NOTE) Fact Sheet for Patients: BloggerCourse.com  Fact Sheet for Healthcare Providers: SeriousBroker.it  This test is not yet approved or cleared by the [place] FDA and has been authorized for detection and/or diagnosis of SARS-CoV-2 by FDA under an Emergency Use Authorization (EUA). This EUA will remain in effect (meaning this test can be used) for the duration of the COVID-19 declaration under Section 564(b)(1) of the Act, 21 U.S.C. section 360bbb-3(b)(1), unless the authorization is terminated or revoked.  Performed at [facility], [address], [place], [place] [address]   Urine Culture (for pregnant, neutropenic or urologic patients or patients with an indwelling urinary catheter)     Status: Abnormal (Preliminary result)   Collection Time: 09/06/22  6:33 AM   Specimen: Urine, Clean Catch   Result Value Ref Range Status   Specimen Description   Final    URINE, CLEAN CATCH Performed at [facility], [address] [place], [place] [address]    Special Requests   Final    NONE Performed at [facility], [address] [place], [place] [address]    Culture (A)  Final    50,000 COLONIES/mL GRAM NEGATIVE RODS SUSCEPTIBILITIES TO FOLLOW Performed at [facility], [address], [place], [place] [address]    Report Status PENDING  Incomplete    Labs: CBC: Recent Labs  Lab 09/06/22 0538 09/07/22 0407  WBC 3.7* 4.1  NEUTROABS 1.8  --   HGB 9.6* 8.9*  HCT 29.1* 26.4*  MCV 96.7 96.0  PLT 54* 52*   Basic Metabolic Panel: Recent Labs  Lab 09/06/22 0538 09/07/22 0407  NA 136 134*  K 3.6 4.3  CL 101 103  CO2 25 26  GLUCOSE 114* 93  BUN 34* 29*  CREATININE 0.87 0.79  CALCIUM 9.2 8.7*   Liver Function Tests: No results for input(s): "AST", "ALT", "ALKPHOS", "BILITOT", "PROT", "ALBUMIN" in the last 168 hours. CBG: No results for input(s): "GLUCAP" in the last 168 hours.  Discharge time spent: greater than 30 minutes.  Signed: Marrion Coy, MD [facility] 09/07/2022

## 2022-09-07 NOTE — Care Management Obs Status (Signed)
[place] NOTIFICATION   Patient Details  Name: Alicia Moran MRN: [id number] Date of Birth: 05-Sep-1928   Medicare Observation Status Notification Given:  Yes    Candie Chroman, LCSW 09/07/2022, 12:38 PM

## 2022-09-07 NOTE — Progress Notes (Signed)
PT Cancellation Note  Patient Details Name: Alicia Moran MRN: [id number] DOB: Nov 17, 1928   Cancelled Treatment:    Reason Eval/Treat Not Completed: Patient declined, no reason specified. Patient states she got up earlier with OT. Does not feel that she has any needs at this time. Should be discharging back home today.    Montavius Subramaniam 09/07/2022, 12:00 PM

## 2022-09-07 NOTE — Progress Notes (Signed)
Patient is being discharged home. Discharge papers given and explained to patient. She verbalized understanding. Medications and f/u appointments reviewed. No Rx at this time. Awaiting transportation.

## 2022-09-07 NOTE — Evaluation (Signed)
Occupational Therapy Evaluation Patient Details Name: Alicia Moran MRN: [id number] DOB: 12/13/1928 Today's Date: 09/07/2022   History of Present Illness Pt is a 89 y/o female with PMH including CVA, a-fib, anxiety/depression, neuropathy, migraine, CHF, glaucoma. Here with dizziness and nausea and increased urination.   Clinical Impression   Patient received for OT evaluation. See flowsheet below for details of function. Pt appears at functional baseline. Awaiting PT evaluation to ensure pt's mobility is at baseline; otherwise, able to d/c home with prior level of caregiver support.  Patient with no further need for OT in acute care; discharge OT services.       Recommendations for follow up therapy are one component of a multi-disciplinary discharge planning process, led by the attending physician.  Recommendations may be updated based on patient status, additional functional criteria and insurance authorization.   Follow Up Recommendations  No OT follow up     Assistance Recommended at Discharge Intermittent Supervision/Assistance  Patient can return home with the following Direct supervision/assist for financial management;Assistance with cooking/housework;Assist for transportation;Help with stairs or ramp for entrance    Functional Status Assessment  Patient has not had a recent decline in their functional status  Equipment Recommendations  None recommended by OT    Recommendations for Other Services       Precautions / Restrictions Precautions Precautions: Fall Restrictions Weight Bearing Restrictions: No      Mobility Bed Mobility Overal bed mobility: Independent                  Transfers Overall transfer level: Modified independent Equipment used: Rolling walker (2 wheels)               General transfer comment: Pt states she prefers her rollator, but is able to use RW today.      Balance Overall balance assessment: Modified Independent                                          ADL either performed or assessed with clinical judgement   ADL Overall ADL's : At baseline                                       General ADL Comments: Pt able to perform bed mobility (I), don shoes BIL at EOB independently, walk with RW to sink (approx 6 feet; OT SBA for safety, but no loss of balance), brush teeth at sink independently, and return to EOB. Denies dizziness; states she feels back to her baseline and has no concerns about d/c home.     Vision   Additional Comments: has glaucoma at baseline     Perception     Praxis      Pertinent Vitals/Pain Pain Assessment Pain Assessment: No/denies pain     Hand Dominance Right   Extremity/Trunk Assessment Upper Extremity Assessment Upper Extremity Assessment: Overall WFL for tasks assessed   Lower Extremity Assessment Lower Extremity Assessment: Overall WFL for tasks assessed       Communication Communication Communication: HOH   Cognition Arousal/Alertness: Awake/alert Behavior During Therapy: WFL for tasks assessed/performed Overall Cognitive Status: Within Functional Limits for tasks assessed  General Comments: Pt unprompted stated she was sorry to miss the [facility] basketball game yesterday and was able to recall the team that they were playing; asked OT if [facility] won the game. Pt is a Insurance underwriter in sociology.     General Comments       Exercises     Shoulder Instructions      Home Living Family/patient expects to be discharged to:: Private residence Living Arrangements: Alone (with frequent caregiver presence, including every night) Available Help at Discharge: Personal care attendant;Available PRN/intermittently Type of Home: House Home Access: Ramped entrance     Home Layout: One level     Bathroom Shower/Tub: Chief Strategy Officer: Handicapped height Bathroom  Accessibility: Yes How Accessible: Accessible via walker Home Equipment: Wheelchair - manual;Rollator (4 wheels);Tub bench;Grab bars - toilet;Grab bars - tub/shower   Additional Comments: Pt states that she has caregiver present nightly, and Kendal Hymen (main caregiver) present for assistance with driving, outings, groceries. States she does meal delivery much of the time. States she still changes her own sheets and washes laundry. Pt states that her husband and daughter have both died and she has no other family or friends due to her advanced age (all have died).      Prior Functioning/Environment Prior Level of Function : Needs assist       Physical Assist : ADLs (physical)   ADLs (physical): IADLs Mobility Comments: Was in SNF in July 2023 following hip fx; states she has had no further falls since then. Uses rollator constantly. ADLs Comments: Pt states she is MOD (I) ADLs. Assistance for some IADLs, especially driving and community outings. Pt uses elastic laces in her shoes.        OT Problem List:        OT Treatment/Interventions:      OT Goals(Current goals can be found in the care plan section) Acute Rehab OT Goals Patient Stated Goal: Go home  OT Frequency:      Co-evaluation              AM-PAC OT "6 Clicks" Daily Activity     Outcome Measure Help from another person eating meals?: None Help from another person taking care of personal grooming?: None Help from another person toileting, which includes using toliet, bedpan, or urinal?: None Help from another person bathing (including washing, rinsing, drying)?: None Help from another person to put on and taking off regular upper body clothing?: None Help from another person to put on and taking off regular lower body clothing?: None 6 Click Score: 24   End of Session Equipment Utilized During Treatment: Gait belt;Rolling walker (2 wheels) Nurse Communication: Mobility status  Activity Tolerance: Patient  tolerated treatment well Patient left: in bed;with call bell/phone within reach;with bed alarm set (RN in room at end of session)                   Time: 1610-9604 OT Time Calculation (min): 23 min Charges:  OT General Charges $OT Visit: 1 Visit OT Evaluation $OT Eval Moderate Complexity: 1 Mod  Tidus [place] Junie Panning, MS, OTR/L  Alvester Morin 09/07/2022, 10:42 AM

## 2022-09-07 NOTE — TOC CM/SW Note (Signed)
Patient has orders to discharge home today. Chart reviewed. No TOC needs identified. She has someone that will be picking her up. CSW signing off.  Dayton Scrape, [place]

## 2022-09-08 LAB — URINE CULTURE: Culture: 50000 — AB

## 2022-10-03 ENCOUNTER — Inpatient Hospital Stay (HOSPITAL_BASED_OUTPATIENT_CLINIC_OR_DEPARTMENT_OTHER): Payer: Medicare Other | Admitting: Medical Oncology

## 2022-10-03 ENCOUNTER — Inpatient Hospital Stay: Payer: Medicare Other | Attending: Oncology

## 2022-10-03 ENCOUNTER — Encounter: Payer: Self-pay | Admitting: Medical Oncology

## 2022-10-03 VITALS — BP 140/71 | HR 79 | Temp 97.8°F | Resp 18 | Wt 121.0 lb

## 2022-10-03 DIAGNOSIS — D509 Iron deficiency anemia, unspecified: Secondary | ICD-10-CM | POA: Insufficient documentation

## 2022-10-03 DIAGNOSIS — E785 Hyperlipidemia, unspecified: Secondary | ICD-10-CM | POA: Insufficient documentation

## 2022-10-03 DIAGNOSIS — D472 Monoclonal gammopathy: Secondary | ICD-10-CM | POA: Diagnosis not present

## 2022-10-03 DIAGNOSIS — D696 Thrombocytopenia, unspecified: Secondary | ICD-10-CM | POA: Diagnosis not present

## 2022-10-03 LAB — CBC WITH DIFFERENTIAL (CANCER CENTER ONLY)
Abs Immature Granulocytes: 0.01 10*3/uL (ref 0.00–0.07)
Basophils Absolute: 0 10*3/uL (ref 0.0–0.1)
Basophils Relative: 1 %
Eosinophils Absolute: 0 10*3/uL (ref 0.0–0.5)
Eosinophils Relative: 0 %
HCT: 26 % — ABNORMAL LOW (ref 36.0–46.0)
Hemoglobin: 8.7 g/dL — ABNORMAL LOW (ref 12.0–15.0)
Immature Granulocytes: 0 %
Lymphocytes Relative: 45 %
Lymphs Abs: 1.6 10*3/uL (ref 0.7–4.0)
MCH: 32.2 pg (ref 26.0–34.0)
MCHC: 33.5 g/dL (ref 30.0–36.0)
MCV: 96.3 fL (ref 80.0–100.0)
Monocytes Absolute: 0.3 10*3/uL (ref 0.1–1.0)
Monocytes Relative: 8 %
Neutro Abs: 1.6 10*3/uL — ABNORMAL LOW (ref 1.7–7.7)
Neutrophils Relative %: 46 %
Platelet Count: 60 10*3/uL — ABNORMAL LOW (ref 150–400)
RBC: 2.7 MIL/uL — ABNORMAL LOW (ref 3.87–5.11)
RDW: 13.5 % (ref 11.5–15.5)
WBC Count: 3.5 10*3/uL — ABNORMAL LOW (ref 4.0–10.5)
nRBC: 0 % (ref 0.0–0.2)

## 2022-10-03 LAB — SAMPLE TO BLOOD BANK

## 2022-10-03 LAB — FERRITIN: Ferritin: 116 ng/mL (ref 11–307)

## 2022-10-03 LAB — IRON AND TIBC
Iron: 77 ug/dL (ref 28–170)
Saturation Ratios: 24 % (ref 10.4–31.8)
TIBC: 315 ug/dL (ref 250–450)
UIBC: 238 ug/dL

## 2022-10-03 NOTE — Progress Notes (Signed)
Hematology and Oncology Follow Up Visit  Alicia Moran [id number] [birth year] 89 y.o. 10/03/2022  Past Medical History:  Diagnosis Date   Hyperlipidemia    Migraine headache    Stroke     Principle Diagnosis:  Iron Deficiency Anemia MGUS Thrombocytopenia   Current Therapy:   IDA-PRBC/IV Iron PRN MGUS- has graduated from surveillance Thrombocytopenia- Surveillance     Interim History:  Alicia Moran is back for follow-up for her IDA and Thrombocytopenia:  Since her last visit she has had one admission visit from 09/06/2022-09/07/2022 for a bad headache and dizziness. Per patient and notes from her discharge summary she had the dizziness and headache since her blood transfusion on 08/31/2022. Per her hospitalitis her symptoms are likely related to some gastroenteritis and dehydration. She improved with fluids. She did have an MRI of her brain which showed a pontine lesion- suspected prior stroke.   She presents today with her aid who drive her here. She reports that overall she is doing ok. Still has chronic fatigue but has not noticed that this has worsened. She asks about B12 injections as her mother used to need these.   She denies any bleeding, bruising, SOB, chest pain.      Wt Readings from Last 3 Encounters:  10/03/22 121 lb (54.9 kg)  09/06/22 126 lb 5.2 oz (57.3 kg)  08/30/22 121 lb (54.9 kg)     Medications:   Current Outpatient Medications:    acetaminophen (TYLENOL) 500 MG tablet, Take 500-1,000 mg by mouth every 6 (six) hours as needed (pain)., Disp: , Rfl:    ALPRAZolam (XANAX) 0.25 MG tablet, Take 0.5 tablets (0.125 mg total) by mouth 2 (two) times daily as needed for anxiety or sleep., Disp: 4 tablet, Rfl: 0   aspirin 81 MG tablet, Take 81 mg by mouth daily., Disp: , Rfl:    Cholecalciferol 50 MCG (2000 UT) CAPS, Take 2,000 Units by mouth daily., Disp: , Rfl:    Coenzyme Q10 (COQ10) 50 MG CAPS, Take 50 mg by mouth daily., Disp: , Rfl:    cyclobenzaprine  (FEXMID) 7.5 MG tablet, Take 7.5 mg by mouth 3 (three) times daily as needed for muscle spasms., Disp: , Rfl:    famotidine (PEPCID) 10 MG tablet, Take 10 mg by mouth daily as needed for heartburn or indigestion., Disp: , Rfl:    glucosamine-chondroitin 500-400 MG tablet, Take 1 tablet by mouth as directed., Disp: , Rfl:    meclizine (ANTIVERT) 25 MG tablet, Take 25 mg by mouth 3 (three) times daily as needed., Disp: , Rfl:    metoprolol succinate (TOPROL-XL) 50 MG 24 hr tablet, TAKE 1 TABLET EVERY DAY, Disp: 90 tablet, Rfl: 0   Multiple Vitamins-Minerals (PRESERVISION AREDS 2+MULTI VIT PO), Take 1 tablet by mouth daily., Disp: , Rfl:    rOPINIRole (REQUIP) 0.25 MG tablet, Take 1 tablet (0.25 mg total) by mouth 2 (two) times daily., Disp: , Rfl:    simvastatin (ZOCOR) 10 MG tablet, TAKE 1 TABLET AT BEDTIME, Disp: 90 tablet, Rfl: 3  Allergies:  Allergies  Allergen Reactions   Erythromycin Diarrhea   Nsaids Other (See Comments)    Other Reaction: GI ulcers    Past Medical History, Surgical history, Social history, and Family History were reviewed and updated.  Review of Systems: Review of Systems  Constitutional:  Positive for fatigue. Negative for appetite change, fever and unexpected weight change.  HENT:   Negative for nosebleeds.   Respiratory:  Negative for chest tightness, cough, hemoptysis  and shortness of breath.   Cardiovascular:  Negative for chest pain and leg swelling.  Gastrointestinal:  Negative for abdominal pain and blood in stool.  Genitourinary:  Negative for hematuria and vaginal bleeding.   Hematological:  Does not bruise/bleed easily.    Physical Exam:  weight is 121 lb (54.9 kg). Her temperature is 97.8 F (36.6 C). Her blood pressure is 140/71 (abnormal) and her pulse is 79. Her respiration is 18 and oxygen saturation is 97%.   Physical Exam General: NAD. Setting comfortably in wheelchair  Cardiovascular: regular rate and rhythm. 2/6 systolic murmur best heard  on the right chest wall Pulmonary: clear ant fields Abdomen: soft, nontender, + bowel sounds GU: no suprapubic tenderness Extremities: no edema, no joint deformities Skin: no rashes Neurological: Weakness but otherwise nonfocal   Lab Results  Component Value Date   WBC 3.5 (L) 10/03/2022   HGB 8.7 (L) 10/03/2022   HCT 26.0 (L) 10/03/2022   MCV 96.3 10/03/2022   PLT 60 (L) 10/03/2022     Chemistry      Component Value Date/Time   NA 134 (L) 09/07/2022 0407   NA 140 06/10/2014 2335   K 4.3 09/07/2022 0407   K 3.8 06/10/2014 2335   CL 103 09/07/2022 0407   CL 106 06/10/2014 2335   CO2 26 09/07/2022 0407   CO2 26 06/10/2014 2335   BUN 29 (H) 09/07/2022 0407   BUN 25 (H) 06/10/2014 2335   CREATININE 0.79 09/07/2022 0407   CREATININE 1.09 06/10/2014 2335   CREATININE 0.80 12/24/2011 1109      Component Value Date/Time   CALCIUM 8.7 (L) 09/07/2022 0407   CALCIUM 8.7 06/10/2014 2335   ALKPHOS 68 12/22/2021 0418   ALKPHOS 92 01/18/2013 1228   AST 17 12/22/2021 0418   AST 21 01/18/2013 1228   ALT 12 12/22/2021 0418   ALT 20 01/18/2013 1228   BILITOT 0.8 12/22/2021 0418   BILITOT 0.4 01/18/2013 1228      Assessment and Plan- Patient is a 89 y.o. female    Encounter Diagnoses  Name Primary?   Iron deficiency anemia, unspecified iron deficiency anemia type Yes   Monoclonal gammopathy of unknown significance (MGUS)    Thrombocytopenia    IDA: Chronic in nature. Likely fueled by underlying bone marrow disorder. Her CBC today shows a fairly stable hemoglobin of 8.7- previously 8.9 3 weeks ago. No active or suspected bleeding. Given how well her counts have held we will plan to see her back in 3 weeks for recheck and consideration of 1 unit PRBC if needed. She will continue her iron rich diet. Iron storage labs pending but were acceptable at last visit. Will add B12 level to next visits labs per patient request however we did discuss her normal MCV.   MGUS: As previously  discussed in Dr. Milinda Cave note her monoclonal antibiodies have been stable for over 2 years so ongoing monitoring at this time has been halted given patients desires for avoiding further work up or treatment if abnormal.   Thrombocytopenia: Chronic in nature. Likely secondary to underlying bone marrow disorder as stated above. Her platelets are actually a bit improved today to 60. No active bleeding or bruising. This is very reassuring.    Disposition: Nothing needed today- we will cancel appointment held tomorrow for possible blood transfusion RTC 3 weeks APP Day 1 labs: CBC w/, CMP, iron, ferritin, B12, hold tube Day 2 possible blood-[place]    Clent Jacks PA-C 4/17/202411:28 AM

## 2022-10-03 NOTE — Progress Notes (Signed)
No new concerns today 

## 2022-10-04 ENCOUNTER — Inpatient Hospital Stay: Payer: Medicare Other

## 2022-10-23 ENCOUNTER — Other Ambulatory Visit: Payer: Self-pay | Admitting: Cardiovascular Disease

## 2022-10-23 NOTE — Telephone Encounter (Signed)
last visit 10/13/2021--Disposition:   FU with me in 12 months.  next visit none

## 2022-10-24 ENCOUNTER — Encounter: Payer: Self-pay | Admitting: Oncology

## 2022-10-24 ENCOUNTER — Inpatient Hospital Stay (HOSPITAL_BASED_OUTPATIENT_CLINIC_OR_DEPARTMENT_OTHER): Payer: Medicare Other | Admitting: Oncology

## 2022-10-24 ENCOUNTER — Inpatient Hospital Stay: Payer: Medicare Other | Attending: Oncology

## 2022-10-24 VITALS — BP 147/73 | HR 73 | Temp 99.8°F | Resp 16 | Ht 63.0 in | Wt 121.4 lb

## 2022-10-24 DIAGNOSIS — Z87891 Personal history of nicotine dependence: Secondary | ICD-10-CM | POA: Insufficient documentation

## 2022-10-24 DIAGNOSIS — D696 Thrombocytopenia, unspecified: Secondary | ICD-10-CM

## 2022-10-24 DIAGNOSIS — D509 Iron deficiency anemia, unspecified: Secondary | ICD-10-CM | POA: Insufficient documentation

## 2022-10-24 DIAGNOSIS — D472 Monoclonal gammopathy: Secondary | ICD-10-CM | POA: Diagnosis not present

## 2022-10-24 LAB — IRON AND TIBC
Iron: 66 ug/dL (ref 28–170)
Saturation Ratios: 21 % (ref 10.4–31.8)
TIBC: 308 ug/dL (ref 250–450)
UIBC: 242 ug/dL

## 2022-10-24 LAB — CMP (CANCER CENTER ONLY)
ALT: 10 U/L (ref 0–44)
AST: 24 U/L (ref 15–41)
Albumin: 4.1 g/dL (ref 3.5–5.0)
Alkaline Phosphatase: 60 U/L (ref 38–126)
Anion gap: 9 (ref 5–15)
BUN: 37 mg/dL — ABNORMAL HIGH (ref 8–23)
CO2: 27 mmol/L (ref 22–32)
Calcium: 9.3 mg/dL (ref 8.9–10.3)
Chloride: 99 mmol/L (ref 98–111)
Creatinine: 0.98 mg/dL (ref 0.44–1.00)
GFR, Estimated: 54 mL/min — ABNORMAL LOW (ref >60)
Glucose, Bld: 98 mg/dL (ref 70–99)
Potassium: 4.2 mmol/L (ref 3.5–5.1)
Sodium: 135 mmol/L (ref 135–145)
Total Bilirubin: 0.8 mg/dL (ref 0.3–1.2)
Total Protein: 7.8 g/dL (ref 6.5–8.1)

## 2022-10-24 LAB — VITAMIN B12: Vitamin B-12: 306 pg/mL (ref 180–914)

## 2022-10-24 LAB — CBC WITH DIFFERENTIAL (CANCER CENTER ONLY)
Abs Immature Granulocytes: 0.01 10*3/uL (ref 0.00–0.07)
Basophils Absolute: 0 10*3/uL (ref 0.0–0.1)
Basophils Relative: 1 %
Eosinophils Absolute: 0 10*3/uL (ref 0.0–0.5)
Eosinophils Relative: 0 %
HCT: 25.6 % — ABNORMAL LOW (ref 36.0–46.0)
Hemoglobin: 8.5 g/dL — ABNORMAL LOW (ref 12.0–15.0)
Immature Granulocytes: 0 %
Lymphocytes Relative: 44 %
Lymphs Abs: 1.6 10*3/uL (ref 0.7–4.0)
MCH: 32.3 pg (ref 26.0–34.0)
MCHC: 33.2 g/dL (ref 30.0–36.0)
MCV: 97.3 fL (ref 80.0–100.0)
Monocytes Absolute: 0.3 10*3/uL (ref 0.1–1.0)
Monocytes Relative: 7 %
Neutro Abs: 1.8 10*3/uL (ref 1.7–7.7)
Neutrophils Relative %: 48 %
Platelet Count: 60 10*3/uL — ABNORMAL LOW (ref 150–400)
RBC: 2.63 MIL/uL — ABNORMAL LOW (ref 3.87–5.11)
RDW: 14.2 % (ref 11.5–15.5)
WBC Count: 3.7 10*3/uL — ABNORMAL LOW (ref 4.0–10.5)
nRBC: 0 % (ref 0.0–0.2)

## 2022-10-24 LAB — FERRITIN: Ferritin: 115 ng/mL (ref 11–307)

## 2022-10-24 LAB — SAMPLE TO BLOOD BANK

## 2022-10-24 NOTE — Progress Notes (Signed)
[facility]  [phone] [phone]) [phone]  ID: Alicia Moran OB: 1928-07-31  MR#: [id number]  AOZ#:[id number]  [name], [name] (Internal Medicine) [name], [name] (Cardiology) [name], [name] (Oncology)  CHIEF COMPLAINT: Thrombocytopenia, iron deficiency anemia, MGUS.  INTERVAL HISTORY: Patient returns to clinic today for repeat laboratory work and further evaluation.  She continues to have chronic weakness and fatigue.  She also has multiple medical complaints that are chronic in nature.  She has no neurologic complaints. She denies any recent fevers or illnesses. She has a good appetite and denies weight loss. She has no chest pain,cough, hemoptysis, or shortness of breath. She denies any nausea, vomiting, constipation, or diarrhea. She has no melena or hematochezia. She has no urinary complaints.  Patient offers no further specific complaints today.  REVIEW OF SYSTEMS:   Review of Systems  Constitutional: Negative.  Negative for fever, malaise/fatigue and weight loss.  Respiratory: Negative.  Negative for cough and shortness of breath.   Cardiovascular: Negative.  Negative for chest pain and leg swelling.  Gastrointestinal: Negative.  Negative for abdominal pain, blood in stool and melena.  Genitourinary: Negative.  Negative for hematuria.  Musculoskeletal: Negative.  Negative for back pain.  Skin: Negative.  Negative for rash.  Neurological: Negative.  Negative for focal weakness, weakness and headaches.  Endo/Heme/Allergies: Negative.  Does not bruise/bleed easily.  Psychiatric/Behavioral: Negative.  The patient is not nervous/anxious and does not have insomnia.     As per HPI. Otherwise, a complete review of systems is negative.  PAST MEDICAL HISTORY: Past Medical History:  Diagnosis Date   Hyperlipidemia    Migraine headache    Stroke (HCC)      PAST SURGICAL HISTORY: History reviewed. No pertinent surgical history.  FAMILY HISTORY: Family History  Problem Relation Age of Onset   Hypertension Mother    Diabetes Mother    Hypertension Father     ADVANCED DIRECTIVES (Y/N):  N  HEALTH MAINTENANCE: Social History   Tobacco Use   Smoking status: Former    Types: Cigarettes    Quit date: 03/29/1986    Years since quitting: 36.5   Smokeless tobacco: Never  Substance Use Topics   Alcohol use: No   Drug use: No     Colonoscopy:  PAP:  Bone density:  Lipid panel:  Allergies  Allergen Reactions   Erythromycin Diarrhea   Nsaids Other (See Comments)    Other Reaction: GI ulcers    Current Outpatient Medications  Medication Sig Dispense Refill   acetaminophen (TYLENOL) 500 MG tablet Take 500-1,000 mg by mouth every 6 (six) hours as needed (pain).     ALPRAZolam (XANAX) 0.25 MG tablet Take 0.5 tablets (0.125 mg total) by mouth 2 (two) times daily as needed for anxiety or sleep. 4 tablet 0   aspirin 81 MG tablet Take 81 mg by mouth daily.     Cholecalciferol 50 MCG (2000 UT) CAPS Take 2,000 Units by mouth daily.     Coenzyme Q10 (COQ10) 50 MG CAPS Take 50 mg by mouth daily.     cyclobenzaprine (FEXMID) 7.5 MG tablet Take 7.5 mg by mouth 3 (three) times daily as needed for muscle spasms.     famotidine (PEPCID) 10 MG tablet Take 10 mg by mouth daily as needed for heartburn or indigestion.     glucosamine-chondroitin 500-400 MG tablet Take 1 tablet by mouth as  directed.     meclizine (ANTIVERT) 25 MG tablet Take 25 mg by mouth 3 (three) times daily as needed.     metoprolol succinate (TOPROL-XL) 50 MG 24 hr tablet Take 1 tablet (50 mg total) by mouth daily. Please call the office to schedule yearly follow up prior to further refills 90 tablet 0   Multiple Vitamins-Minerals (PRESERVISION AREDS 2+MULTI VIT PO) Take 1 tablet by mouth daily.     rOPINIRole (REQUIP) 0.25 MG tablet Take 1 tablet (0.25 mg total) by mouth 2  (two) times daily.     simvastatin (ZOCOR) 10 MG tablet TAKE 1 TABLET AT BEDTIME 90 tablet 3   No current facility-administered medications for this visit.    OBJECTIVE: Vitals:   10/24/22 1013  BP: (!) 147/73  Pulse: 73  Resp: 16  Temp: 99.8 F (37.7 C)  SpO2: 98%     Body mass index is 21.51 kg/m.    ECOG FS:0 - Asymptomatic  General: Well-developed, well-nourished, no acute distress.  Sitting in a wheel chair. Eyes: Pink conjunctiva, anicteric sclera. HEENT: Normocephalic, moist mucous membranes. Lungs: No audible wheezing or coughing. Heart: Regular rate and rhythm. Abdomen: Soft, nontender, no obvious distention. Musculoskeletal: No edema, cyanosis, or clubbing. Neuro: Alert, answering all questions appropriately. Cranial nerves grossly intact. Skin: No rashes or petechiae noted. Psych: Normal affect.   LAB RESULTS:  Lab Results  Component Value Date   NA 135 10/24/2022   K 4.2 10/24/2022   CL 99 10/24/2022   CO2 27 10/24/2022   GLUCOSE 98 10/24/2022   BUN 37 (H) 10/24/2022   CREATININE 0.98 10/24/2022   CALCIUM 9.3 10/24/2022   PROT 7.8 10/24/2022   ALBUMIN 4.1 10/24/2022   AST 24 10/24/2022   ALT 10 10/24/2022   ALKPHOS 60 10/24/2022   BILITOT 0.8 10/24/2022   GFRNONAA 54 (L) 10/24/2022   GFRAA >60 08/31/2019    Lab Results  Component Value Date   WBC 3.7 (L) 10/24/2022   NEUTROABS 1.8 10/24/2022   HGB 8.5 (L) 10/24/2022   HCT 25.6 (L) 10/24/2022   MCV 97.3 10/24/2022   PLT 60 (L) 10/24/2022   Lab Results  Component Value Date   IRON 66 10/24/2022   TIBC 308 10/24/2022   IRONPCTSAT 21 10/24/2022   Lab Results  Component Value Date   FERRITIN 115 10/24/2022       STUDIES: No results found.  ASSESSMENT: Thrombocytopenia, Iron deficiency anemia, MGUS  PLAN:    Anemia: Chronic and unchanged.  Patient's most recent hemoglobin is 8.5.  Iron stores continue to be within normal limits. Given her chronic thrombocytopenia, suspect patient  has an underlying bone marrow disorder but this would take a bone marrow biopsy to diagnose which we will not do.  Patient does not require blood transfusion today.  We agreed upon that if her hemoglobin falls below 8.0 she would pursue transfusion.  No intervention is needed at this time.  Return to clinic in 3 months with repeat laboratory work and further evaluation.   Thrombocytopenia: Chronic and unchanged.  Patient's platelet count has ranged from 41-63 since December 21, 2021.  Current platelet count is 60.  Monitor.   MGUS:  Patient's most recent M-spike on May 04, 2021 was reported at 1.3 which is unchanged from 2 years prior. Her kappa-free light chains are 294.8.  No intervention is needed.  Can discontinue monitoring.  Patient expressed understanding and was in agreement with this plan. She also understands that She can call  clinic at any time with any questions, concerns, or complaints.    [name], MD   10/24/2022 1:48 PM

## 2022-10-25 ENCOUNTER — Inpatient Hospital Stay: Payer: Medicare Other

## 2022-11-06 ENCOUNTER — Ambulatory Visit (INDEPENDENT_AMBULATORY_CARE_PROVIDER_SITE_OTHER): Payer: Medicare Other | Admitting: Vascular Surgery

## 2022-11-13 ENCOUNTER — Telehealth: Payer: Self-pay | Admitting: Oncology

## 2022-11-13 NOTE — Telephone Encounter (Signed)
Patients caregiver called to let us know she will be out of town and not able to bring patient to August appointment. She asked to r/s to the next week. This is for Lab/MD/Possible blood. Please advise.   Kendal Hymen- [phone]  Thank you

## 2022-11-15 ENCOUNTER — Ambulatory Visit: Payer: Medicare Other | Admitting: Podiatry

## 2022-11-15 NOTE — Telephone Encounter (Signed)
Pt called and stated that she did not tell Kendal Hymen to move her appt to next week. She requested to move her appts back to August. Appts have been moved per pt requests.

## 2022-11-29 ENCOUNTER — Other Ambulatory Visit: Payer: Medicare Other

## 2022-11-29 ENCOUNTER — Ambulatory Visit: Payer: Medicare Other | Admitting: Oncology

## 2022-11-30 ENCOUNTER — Ambulatory Visit: Payer: Medicare Other

## 2023-01-17 ENCOUNTER — Encounter: Payer: Self-pay | Admitting: Cardiovascular Disease

## 2023-01-17 ENCOUNTER — Ambulatory Visit: Payer: Medicare Other | Attending: Cardiovascular Disease | Admitting: Cardiovascular Disease

## 2023-01-17 VITALS — BP 171/84 | HR 85 | Ht 62.0 in | Wt 121.0 lb

## 2023-01-17 DIAGNOSIS — I10 Essential (primary) hypertension: Secondary | ICD-10-CM | POA: Insufficient documentation

## 2023-01-17 DIAGNOSIS — I359 Nonrheumatic aortic valve disorder, unspecified: Secondary | ICD-10-CM | POA: Diagnosis present

## 2023-01-17 DIAGNOSIS — I340 Nonrheumatic mitral (valve) insufficiency: Secondary | ICD-10-CM | POA: Insufficient documentation

## 2023-01-17 NOTE — Patient Instructions (Signed)
Medication Instructions:  Your Physician recommend you continue on your current medication as directed.    *If you need a refill on your cardiac medications before your next appointment, please call your pharmacy*   Lab Work: None ordered today.  If you have labs (blood work) drawn today and your tests are completely normal, you will receive your results only by: MyChart Message (if you have MyChart) OR A paper copy in the mail If you have any lab test that is abnormal or we need to change your treatment, we will call you to review the results.   Testing/Procedures: None ordered today.    Follow-Up: At [facility], you and your health needs are our priority.  As part of our continuing mission to provide you with exceptional heart care, we have created designated Provider Care Teams.  These Care Teams include your primary Cardiologist (physician) and Advanced Practice Providers (APPs -  Physician Assistants and Nurse Practitioners) who all work together to provide you with the care you need, when you need it.  We recommend signing up for the patient portal called "MyChart".  Sign up information is provided on this After Visit Summary.  MyChart is used to connect with patients for Virtual Visits (Telemedicine).  Patients are able to view lab/test results, encounter notes, upcoming appointments, etc.  Non-urgent messages can be sent to your provider as well.   To learn more about what you can do with MyChart, go to [email or web address].    Your next appointment:   12 month(s)  Provider:   You may see Lorine Bears, MD or one of the following Advanced Practice Providers on your designated Care Team:   Nicolasa Ducking, NP Eula Listen, PA-C Cadence Fransico Michael, PA-C Charlsie Quest, NP

## 2023-01-17 NOTE — Progress Notes (Signed)
Cardiology Office Note   Date:  01/17/2023   ID:  BECKHAM MACKELLAR, DOB May 22, 1929, MRN [id number]  PCP:  [name], MD  Cardiologist:   Lorine Bears, MD   Chief Complaint  Patient presents with   Follow-up    12 month follow up. Patient states that she feels tired on today.  Meds reviewed.       History of Present Illness: Alicia Moran is a 89 y.o. female who is here today for a follow-up visit regarding mild to moderate valvular disease. Paroxysmal atrial fibrillation is mentioned in her chart. However, after extensive review of her chart from [facility], I did not find conclusive evidence of atrial fibrillation. It appears that in 2008 she suffered from a stroke in spite of being on Plavix. Thus, she was started on anticoagulation with warfarin at that time for that indication.  She suffered from subdural hematoma in 2015 after a fall and thus warfarin was discontinued at that time and was not resumed.  she had previous cardiac catheterization at [facility] in 2006 with no obstructive coronary artery disease.    Her daughter died in 01/01/2019and since then the patient had significant depression and grief.  She lives by herself but she does have an aide.  Most recent echocardiogram in April 2023 showed normal LV systolic function with moderate mitral regurgitation and mild aortic stenosis.  She has been dealing with pancytopenia with significant anemia requiring intermittent transfusion.  She follows at the cancer center.  She denies chest pain or shortness of breath but reports fatigue and no energy.  Past Medical History:  Diagnosis Date   Hyperlipidemia    Migraine headache    Stroke [facility])     History reviewed. No pertinent surgical history.   Current Outpatient Medications  Medication Sig Dispense Refill   acetaminophen (TYLENOL) 500 MG tablet Take 500-1,000 mg by mouth every 6 (six) hours as needed (pain).     ALPRAZolam (XANAX) 0.25 MG tablet Take 0.5  tablets (0.125 mg total) by mouth 2 (two) times daily as needed for anxiety or sleep. 4 tablet 0   aspirin 81 MG tablet Take 81 mg by mouth daily.     Cholecalciferol 50 MCG (2000 UT) CAPS Take 2,000 Units by mouth daily.     Coenzyme Q10 (COQ10) 50 MG CAPS Take 50 mg by mouth daily.     cyclobenzaprine (FEXMID) 7.5 MG tablet Take 7.5 mg by mouth 3 (three) times daily as needed for muscle spasms.     famotidine (PEPCID) 10 MG tablet Take 10 mg by mouth daily as needed for heartburn or indigestion.     glucosamine-chondroitin 500-400 MG tablet Take 1 tablet by mouth as directed.     meclizine (ANTIVERT) 25 MG tablet Take 25 mg by mouth 3 (three) times daily as needed.     metoprolol succinate (TOPROL-XL) 50 MG 24 hr tablet Take 1 tablet (50 mg total) by mouth daily. Please call the office to schedule yearly follow up prior to further refills 90 tablet 0   Multiple Vitamins-Minerals (PRESERVISION AREDS 2+MULTI VIT PO) Take 1 tablet by mouth daily.     rOPINIRole (REQUIP) 0.25 MG tablet Take 1 tablet (0.25 mg total) by mouth 2 (two) times daily. (Patient taking differently: Take 0.25 mg by mouth at bedtime.)     simvastatin (ZOCOR) 10 MG tablet TAKE 1 TABLET AT BEDTIME 90 tablet 3   No current facility-administered medications for this visit.  Allergies:   Erythromycin and Nsaids    Social History:  The patient  reports that she quit smoking about 36 years ago. Her smoking use included cigarettes. She has never used smokeless tobacco. She reports that she does not drink alcohol and does not use drugs.   Family History:  The patient's family history includes Diabetes in her mother; Hypertension in her father and mother.    ROS:  Please see the history of present illness.   Otherwise, review of systems are positive for none.   All other systems are reviewed and negative.    PHYSICAL EXAM: VS:  BP (!) 171/84 (BP Location: Right Arm, Patient Position: Sitting, Cuff Size: Normal)   Pulse 85    Ht 5\' 2"  (1.575 m)   Wt 121 lb (54.9 kg)   SpO2 96%   BMI 22.13 kg/m  , BMI Body mass index is 22.13 kg/m. GEN: Well nourished, well developed, in no acute distress  HEENT: normal  Neck: no JVD, carotid bruits, or masses Cardiac: RRR; no rubs, or gallops . 2/6 systolic murmur in the aortic area. 2/6 holosystolic murmur at the apex.  Mild bilateral leg edema worse on the left side Respiratory:  clear to auscultation bilaterally, normal work of breathing GI: soft, nontender, nondistended, + BS MS: no deformity or atrophy  Skin: warm and dry, no rash Neuro:  Strength and sensation are intact Psych: euthymic mood, full affect   EKG:  EKG is ordered today. The ekg ordered today demonstrates: Sinus rhythm with Premature atrial complexes When compared with ECG of 06-Sep-2022 05:27, No significant change was found    Recent Labs: 02/17/2022: TSH 3.253 10/24/2022: ALT 10; BUN 37; Creatinine 0.98; Hemoglobin 8.5; Platelet Count 60; Potassium 4.2; Sodium 135    Lipid Panel    Component Value Date/Time   CHOL 121 08/26/2015 0941   TRIG 48.0 08/26/2015 0941   HDL 52.00 08/26/2015 0941   CHOLHDL 2 08/26/2015 0941   VLDL 9.6 08/26/2015 0941   LDLCALC 60 08/26/2015 0941      Wt Readings from Last 3 Encounters:  01/17/23 121 lb (54.9 kg)  10/24/22 121 lb 6.4 oz (55.1 kg)  10/03/22 121 lb (54.9 kg)        ASSESSMENT AND PLAN:  1.  Moderate valvular heart disease: She has known moderate mitral regurgitation mild aortic stenosis.  Most send echocardiogram in 2023 was stable overall.  No need for further echocardiograms as she is not a candidate for any valvular intervention given her age and comorbidities including significant pancytopenia.   2. Previous history of stroke: No documented history of atrial fibrillation. Continue low-dose aspirin.    3. Essential hypertension: Blood pressure is reasonably controlled on Toprol.   4.  Grief and depression: She did not respond to  multiple medications.  This is being managed by Dr. Arlana Pouch.  5.  Bilateral leg edema: Likely due to chronic venous insufficiency.     Disposition:   FU with me in 12 months.   Signed,  Lorine Bears, MD  01/17/2023 10:54 AM    [facility]

## 2023-01-23 ENCOUNTER — Other Ambulatory Visit: Payer: Self-pay | Admitting: Cardiovascular Disease

## 2023-01-24 ENCOUNTER — Other Ambulatory Visit: Payer: Medicare Other

## 2023-01-24 ENCOUNTER — Ambulatory Visit: Payer: Medicare Other | Admitting: Oncology

## 2023-01-25 ENCOUNTER — Ambulatory Visit: Payer: Medicare Other

## 2023-01-30 ENCOUNTER — Other Ambulatory Visit: Payer: Self-pay | Admitting: Cardiovascular Disease

## 2023-01-30 ENCOUNTER — Ambulatory Visit: Payer: Medicare Other | Admitting: Oncology

## 2023-01-30 ENCOUNTER — Other Ambulatory Visit: Payer: Medicare Other

## 2023-01-31 ENCOUNTER — Ambulatory Visit: Payer: Medicare Other

## 2023-02-04 ENCOUNTER — Telehealth: Payer: Self-pay | Admitting: Cardiology

## 2023-02-04 DIAGNOSIS — I10 Essential (primary) hypertension: Secondary | ICD-10-CM

## 2023-02-04 MED ORDER — BLOOD PRESSURE CUFF MISC: 0 refills | Status: AC

## 2023-02-04 NOTE — Telephone Encounter (Signed)
New Message:     Patient would like to know if Dr Antoine Poche would order her a digital blood pressure cuff please?Marland Kitchen

## 2023-02-04 NOTE — Telephone Encounter (Signed)
Patient called and stated she needed BP monitor ordered sent to pharmacy. She is aware medicare does not cover BP monitor.

## 2023-02-06 ENCOUNTER — Ambulatory Visit: Payer: Medicare Other | Admitting: Oncology

## 2023-02-06 ENCOUNTER — Other Ambulatory Visit: Payer: Medicare Other

## 2023-02-07 ENCOUNTER — Ambulatory Visit: Payer: Medicare Other

## 2023-02-14 ENCOUNTER — Inpatient Hospital Stay (HOSPITAL_BASED_OUTPATIENT_CLINIC_OR_DEPARTMENT_OTHER): Payer: Medicare Other | Admitting: Oncology

## 2023-02-14 ENCOUNTER — Encounter: Payer: Self-pay | Admitting: Oncology

## 2023-02-14 ENCOUNTER — Inpatient Hospital Stay: Payer: Medicare Other | Attending: Oncology

## 2023-02-14 VITALS — BP 138/72 | HR 73 | Temp 95.4°F | Resp 16 | Wt 118.1 lb

## 2023-02-14 DIAGNOSIS — D696 Thrombocytopenia, unspecified: Secondary | ICD-10-CM | POA: Diagnosis not present

## 2023-02-14 DIAGNOSIS — D472 Monoclonal gammopathy: Secondary | ICD-10-CM

## 2023-02-14 DIAGNOSIS — R5383 Other fatigue: Secondary | ICD-10-CM | POA: Insufficient documentation

## 2023-02-14 DIAGNOSIS — D509 Iron deficiency anemia, unspecified: Secondary | ICD-10-CM

## 2023-02-14 DIAGNOSIS — D649 Anemia, unspecified: Secondary | ICD-10-CM | POA: Insufficient documentation

## 2023-02-14 DIAGNOSIS — Z87891 Personal history of nicotine dependence: Secondary | ICD-10-CM | POA: Diagnosis not present

## 2023-02-14 LAB — CMP (CANCER CENTER ONLY)
ALT: 21 U/L (ref 0–44)
AST: 47 U/L — ABNORMAL HIGH (ref 15–41)
Albumin: 4.1 g/dL (ref 3.5–5.0)
Alkaline Phosphatase: 59 U/L (ref 38–126)
Anion gap: 10 (ref 5–15)
BUN: 41 mg/dL — ABNORMAL HIGH (ref 8–23)
CO2: 24 mmol/L (ref 22–32)
Calcium: 9.5 mg/dL (ref 8.9–10.3)
Chloride: 103 mmol/L (ref 98–111)
Creatinine: 0.97 mg/dL (ref 0.44–1.00)
GFR, Estimated: 55 mL/min — ABNORMAL LOW (ref >60)
Glucose, Bld: 104 mg/dL — ABNORMAL HIGH (ref 70–99)
Potassium: 4.1 mmol/L (ref 3.5–5.1)
Sodium: 137 mmol/L (ref 135–145)
Total Bilirubin: 0.5 mg/dL (ref 0.3–1.2)
Total Protein: 8.3 g/dL — ABNORMAL HIGH (ref 6.5–8.1)

## 2023-02-14 LAB — CBC WITH DIFFERENTIAL (CANCER CENTER ONLY)
Abs Immature Granulocytes: 0.02 10*3/uL (ref 0.00–0.07)
Basophils Absolute: 0 10*3/uL (ref 0.0–0.1)
Basophils Relative: 1 %
Eosinophils Absolute: 0 10*3/uL (ref 0.0–0.5)
Eosinophils Relative: 1 %
HCT: 25.8 % — ABNORMAL LOW (ref 36.0–46.0)
Hemoglobin: 8.6 g/dL — ABNORMAL LOW (ref 12.0–15.0)
Immature Granulocytes: 1 %
Lymphocytes Relative: 45 %
Lymphs Abs: 1.9 10*3/uL (ref 0.7–4.0)
MCH: 33.7 pg (ref 26.0–34.0)
MCHC: 33.3 g/dL (ref 30.0–36.0)
MCV: 101.2 fL — ABNORMAL HIGH (ref 80.0–100.0)
Monocytes Absolute: 0.3 10*3/uL (ref 0.1–1.0)
Monocytes Relative: 7 %
Neutro Abs: 1.9 10*3/uL (ref 1.7–7.7)
Neutrophils Relative %: 45 %
Platelet Count: 80 10*3/uL — ABNORMAL LOW (ref 150–400)
RBC: 2.55 MIL/uL — ABNORMAL LOW (ref 3.87–5.11)
RDW: 13.6 % (ref 11.5–15.5)
WBC Count: 4.1 10*3/uL (ref 4.0–10.5)
nRBC: 0 % (ref 0.0–0.2)

## 2023-02-14 LAB — IRON AND TIBC
Iron: 64 ug/dL (ref 28–170)
Saturation Ratios: 20 % (ref 10.4–31.8)
TIBC: 323 ug/dL (ref 250–450)
UIBC: 259 ug/dL

## 2023-02-14 LAB — VITAMIN B12: Vitamin B-12: 436 pg/mL (ref 180–914)

## 2023-02-14 LAB — FERRITIN: Ferritin: 81 ng/mL (ref 11–307)

## 2023-02-14 NOTE — Progress Notes (Signed)
[facility]  Telephone:([phone] Fax:(336) [phone]  ID: Alicia Moran OB: [birth year]  MR#: [id number]  UXL#:[id number]  [name], [name] (Internal Medicine) [name], [name] (Cardiology) [name], [name] (Oncology)  CHIEF COMPLAINT: Anemia, thrombocytopenia.  INTERVAL HISTORY: Patient returns to clinic today for repeat laboratory work and further evaluation.  She continues to have chronic weakness and fatigue.  She also continues to have multiple medical complaints that are also chronic in nature. She has no neurologic complaints. She denies any recent fevers or illnesses. She has a good appetite and denies weight loss. She has no chest pain, cough, hemoptysis, or shortness of breath. She denies any nausea, vomiting, constipation, or diarrhea. She has no melena or hematochezia. She has no urinary complaints.  Patient offers no further specific complaints today.  REVIEW OF SYSTEMS:   Review of Systems  Constitutional:  Positive for malaise/fatigue. Negative for fever and weight loss.  Respiratory: Negative.  Negative for cough and shortness of breath.   Cardiovascular: Negative.  Negative for chest pain and leg swelling.  Gastrointestinal: Negative.  Negative for abdominal pain, blood in stool and melena.  Genitourinary: Negative.  Negative for hematuria.  Musculoskeletal: Negative.  Negative for back pain.  Skin: Negative.  Negative for rash.  Neurological:  Positive for weakness. Negative for focal weakness and headaches.  Endo/Heme/Allergies: Negative.  Does not bruise/bleed easily.  Psychiatric/Behavioral: Negative.  The patient is not nervous/anxious and does not have insomnia.     As per HPI. Otherwise, a complete review of systems is negative.  PAST MEDICAL HISTORY: Past Medical History:  Diagnosis Date   Hyperlipidemia    Migraine headache    Stroke  (HCC)     PAST SURGICAL HISTORY: History reviewed. No pertinent surgical history.  FAMILY HISTORY: Family History  Problem Relation Age of Onset   Hypertension Mother    Diabetes Mother    Hypertension Father     ADVANCED DIRECTIVES (Y/N):  N  HEALTH MAINTENANCE: Social History   Tobacco Use   Smoking status: Former    Current packs/day: 0.00    Types: Cigarettes    Quit date: 03/29/1986    Years since quitting: 36.9   Smokeless tobacco: Never  Substance Use Topics   Alcohol use: No   Drug use: No     Colonoscopy:  PAP:  Bone density:  Lipid panel:  Allergies  Allergen Reactions   Erythromycin Diarrhea   Nsaids Other (See Comments)    Other Reaction: GI ulcers    Current Outpatient Medications  Medication Sig Dispense Refill   acetaminophen (TYLENOL) 500 MG tablet Take 500-1,000 mg by mouth every 6 (six) hours as needed (pain).     ALPRAZolam (XANAX) 0.25 MG tablet Take 0.5 tablets (0.125 mg total) by mouth 2 (two) times daily as needed for anxiety or sleep. 4 tablet 0   aspirin 81 MG tablet Take 81 mg by mouth daily.     Blood Pressure Monitoring (BLOOD PRESSURE CUFF) MISC Take blood pressure daily 1 each 0   Cholecalciferol 50 MCG (2000 UT) CAPS Take 2,000 Units by mouth daily.     Coenzyme Q10 (COQ10) 50 MG CAPS Take 50 mg by mouth daily.     cyclobenzaprine (FEXMID) 7.5 MG tablet Take 7.5 mg by mouth 3 (three) times daily as needed for muscle spasms.     famotidine (PEPCID) 10 MG tablet Take 10  mg by mouth daily as needed for heartburn or indigestion.     glucosamine-chondroitin 500-400 MG tablet Take 1 tablet by mouth as directed.     meclizine (ANTIVERT) 25 MG tablet Take 25 mg by mouth 3 (three) times daily as needed.     metoprolol succinate (TOPROL-XL) 50 MG 24 hr tablet Take 1 tablet (50 mg total) by mouth daily. 90 tablet 3   Multiple Vitamins-Minerals (PRESERVISION AREDS 2+MULTI VIT PO) Take 1 tablet by mouth daily.     rOPINIRole (REQUIP) 0.25 MG  tablet Take 1 tablet (0.25 mg total) by mouth 2 (two) times daily. (Patient taking differently: Take 0.25 mg by mouth at bedtime.)     simvastatin (ZOCOR) 10 MG tablet TAKE 1 TABLET AT BEDTIME 90 tablet 3   No current facility-administered medications for this visit.    OBJECTIVE: Vitals:   02/14/23 1052  BP: 138/72  Pulse: 73  Resp: 16  Temp: (!) 95.4 F (35.2 C)  SpO2: 93%     Body mass index is 21.6 kg/m.    ECOG FS:1 - Symptomatic but completely ambulatory  General: Well-developed, well-nourished, no acute distress.  Sitting in a wheelchair. Eyes: Pink conjunctiva, anicteric sclera. HEENT: Normocephalic, moist mucous membranes. Lungs: No audible wheezing or coughing. Heart: Regular rate and rhythm. Abdomen: Soft, nontender, no obvious distention. Musculoskeletal: No edema, cyanosis, or clubbing. Neuro: Alert, answering all questions appropriately. Cranial nerves grossly intact. Skin: No rashes or petechiae noted. Psych: Normal affect.  LAB RESULTS:  Lab Results  Component Value Date   NA 137 02/14/2023   K 4.1 02/14/2023   CL 103 02/14/2023   CO2 24 02/14/2023   GLUCOSE 104 (H) 02/14/2023   BUN 41 (H) 02/14/2023   CREATININE 0.97 02/14/2023   CALCIUM 9.5 02/14/2023   PROT 8.3 (H) 02/14/2023   ALBUMIN 4.1 02/14/2023   AST 47 (H) 02/14/2023   ALT 21 02/14/2023   ALKPHOS 59 02/14/2023   BILITOT 0.5 02/14/2023   GFRNONAA 55 (L) 02/14/2023   GFRAA >60 08/31/2019    Lab Results  Component Value Date   WBC 4.1 02/14/2023   NEUTROABS 1.9 02/14/2023   HGB 8.6 (L) 02/14/2023   HCT 25.8 (L) 02/14/2023   MCV 101.2 (H) 02/14/2023   PLT 80 (L) 02/14/2023   Lab Results  Component Value Date   IRON 64 02/14/2023   TIBC 323 02/14/2023   IRONPCTSAT 20 02/14/2023   Lab Results  Component Value Date   FERRITIN 81 02/14/2023       STUDIES: No results found.  ASSESSMENT: Anemia, thrombocytopenia.  PLAN:    Anemia: Chronic and unchanged.  Patient's  hemoglobin is 8.6 today.  Iron stores continue to be within normal limits. Given her chronic thrombocytopenia, suspect patient has an underlying bone marrow disorder but this would take a bone marrow biopsy to diagnose which we will not do.  Patient does not require blood transfusion today.  We agreed upon that if her hemoglobin falls below 8.0 she would pursue transfusion.  No intervention is needed at this time.  Return to clinic in 6 months with repeat laboratory work and further evaluation. Thrombocytopenia: Mildly improved.  Patient's platelet count has ranged from 41-80 since December 21, 2021.  Current platelet count is 80. MGUS: Clinically insignificant.  Patient's most recent M-spike on May 04, 2021 was reported at 1.3 which is unchanged from 2 years prior.  No further monitoring is necessary.   Patient expressed understanding and was in agreement with this  plan. She also understands that She can call clinic at any time with any questions, concerns, or complaints.    [name], MD   02/14/2023 1:07 PM

## 2023-02-14 NOTE — Progress Notes (Signed)
Patient would like to discuss her iron, she feels like she's a lot tired than normal. She's also having to use the restroom more than usual. She is also very depressed due to the loss of her husband & daughter, tried many different anti-depressants and nothing has worked.

## 2023-02-15 ENCOUNTER — Inpatient Hospital Stay: Payer: Medicare Other

## 2023-02-15 LAB — SAMPLE TO BLOOD BANK

## 2023-02-17 DIAGNOSIS — C8590 Non-Hodgkin lymphoma, unspecified, unspecified site: Secondary | ICD-10-CM

## 2023-02-17 HISTORY — DX: Non-Hodgkin lymphoma, unspecified, unspecified site: C85.90

## 2023-03-11 ENCOUNTER — Other Ambulatory Visit: Payer: Self-pay

## 2023-03-11 DIAGNOSIS — D509 Iron deficiency anemia, unspecified: Secondary | ICD-10-CM

## 2023-03-12 ENCOUNTER — Encounter: Payer: Self-pay | Admitting: Internal Medicine

## 2023-03-19 ENCOUNTER — Encounter: Payer: Self-pay | Admitting: Oncology

## 2023-03-19 ENCOUNTER — Inpatient Hospital Stay: Payer: Medicare Other | Attending: Oncology

## 2023-03-19 ENCOUNTER — Inpatient Hospital Stay (HOSPITAL_BASED_OUTPATIENT_CLINIC_OR_DEPARTMENT_OTHER): Payer: Medicare Other | Admitting: Oncology

## 2023-03-19 VITALS — BP 170/73 | HR 77 | Temp 98.1°F | Resp 16 | Ht 62.0 in | Wt 111.0 lb

## 2023-03-19 DIAGNOSIS — R591 Generalized enlarged lymph nodes: Secondary | ICD-10-CM | POA: Diagnosis not present

## 2023-03-19 DIAGNOSIS — Z87891 Personal history of nicotine dependence: Secondary | ICD-10-CM | POA: Insufficient documentation

## 2023-03-19 DIAGNOSIS — D696 Thrombocytopenia, unspecified: Secondary | ICD-10-CM | POA: Diagnosis not present

## 2023-03-19 DIAGNOSIS — D472 Monoclonal gammopathy: Secondary | ICD-10-CM | POA: Diagnosis not present

## 2023-03-19 DIAGNOSIS — D649 Anemia, unspecified: Secondary | ICD-10-CM | POA: Insufficient documentation

## 2023-03-19 DIAGNOSIS — D509 Iron deficiency anemia, unspecified: Secondary | ICD-10-CM | POA: Diagnosis not present

## 2023-03-19 LAB — CBC WITH DIFFERENTIAL (CANCER CENTER ONLY)
Abs Immature Granulocytes: 0.03 10*3/uL (ref 0.00–0.07)
Basophils Absolute: 0 10*3/uL (ref 0.0–0.1)
Basophils Relative: 0 %
Eosinophils Absolute: 0 10*3/uL (ref 0.0–0.5)
Eosinophils Relative: 0 %
HCT: 24.6 % — ABNORMAL LOW (ref 36.0–46.0)
Hemoglobin: 8.2 g/dL — ABNORMAL LOW (ref 12.0–15.0)
Immature Granulocytes: 1 %
Lymphocytes Relative: 42 %
Lymphs Abs: 1.5 10*3/uL (ref 0.7–4.0)
MCH: 33.5 pg (ref 26.0–34.0)
MCHC: 33.3 g/dL (ref 30.0–36.0)
MCV: 100.4 fL — ABNORMAL HIGH (ref 80.0–100.0)
Monocytes Absolute: 0.3 10*3/uL (ref 0.1–1.0)
Monocytes Relative: 7 %
Neutro Abs: 1.8 10*3/uL (ref 1.7–7.7)
Neutrophils Relative %: 50 %
Platelet Count: 62 10*3/uL — ABNORMAL LOW (ref 150–400)
RBC: 2.45 MIL/uL — ABNORMAL LOW (ref 3.87–5.11)
RDW: 13.6 % (ref 11.5–15.5)
WBC Count: 3.6 10*3/uL — ABNORMAL LOW (ref 4.0–10.5)
nRBC: 0 % (ref 0.0–0.2)

## 2023-03-19 LAB — PREPARE RBC (CROSSMATCH)

## 2023-03-19 NOTE — Progress Notes (Signed)
[facility]  Telephone:(336) [phone] Fax:(336) [phone]  ID: Takima Noorda Waggle OB: 1928/07/08  MR#: [id number]  PIR#:[id number]  [name], [name] (Internal Medicine) [name], [name] (Cardiology) [name], [name] (Oncology)  CHIEF COMPLAINT: Anemia, thrombocytopenia.  INTERVAL HISTORY: Patient returns to clinic today as an add-on with complaints of increasing weakness and fatigue.  She was recently admitted to [facility] and was found to have some mildly enlarged lymph nodes and peripheral blood flow cytometry consistent with CLL. She also continues to have multiple medical complaints that are also chronic in nature. She has no neurologic complaints. She denies any recent fevers or illnesses. She has a good appetite and denies weight loss. She has no chest pain, cough, hemoptysis, or shortness of breath. She denies any nausea, vomiting, constipation, or diarrhea. She has no melena or hematochezia. She has no urinary complaints.  Patient offers no further specific complaints today.  REVIEW OF SYSTEMS:   Review of Systems  Constitutional:  Positive for malaise/fatigue. Negative for fever and weight loss.  Respiratory: Negative.  Negative for cough and shortness of breath.   Cardiovascular: Negative.  Negative for chest pain and leg swelling.  Gastrointestinal: Negative.  Negative for abdominal pain, blood in stool and melena.  Genitourinary: Negative.  Negative for hematuria.  Musculoskeletal: Negative.  Negative for back pain.  Skin: Negative.  Negative for rash.  Neurological:  Positive for weakness. Negative for focal weakness and headaches.  Endo/Heme/Allergies: Negative.  Does not bruise/bleed easily.  Psychiatric/Behavioral: Negative.  The patient is not nervous/anxious and does not have insomnia.     As per HPI. Otherwise, a complete review of systems is  negative.  PAST MEDICAL HISTORY: Past Medical History:  Diagnosis Date   Hyperlipidemia    Migraine headache    Stroke (HCC)     PAST SURGICAL HISTORY: History reviewed. No pertinent surgical history.  FAMILY HISTORY: Family History  Problem Relation Age of Onset   Hypertension Mother    Diabetes Mother    Hypertension Father     ADVANCED DIRECTIVES (Y/N):  N  HEALTH MAINTENANCE: Social History   Tobacco Use   Smoking status: Former    Current packs/day: 0.00    Types: Cigarettes    Quit date: 03/29/1986    Years since quitting: 36.9   Smokeless tobacco: Never  Substance Use Topics   Alcohol use: No   Drug use: No     Colonoscopy:  PAP:  Bone density:  Lipid panel:  Allergies  Allergen Reactions   Erythromycin Diarrhea   Nsaids Other (See Comments)    Other Reaction: GI ulcers    Current Outpatient Medications  Medication Sig Dispense Refill   acetaminophen (TYLENOL) 500 MG tablet Take 500-1,000 mg by mouth every 6 (six) hours as needed (pain).     ALPRAZolam (XANAX) 0.25 MG tablet Take 0.5 tablets (0.125 mg total) by mouth 2 (two) times daily as needed for anxiety or sleep. 4 tablet 0   aspirin 81 MG tablet Take 81 mg by mouth daily.     Blood Pressure Monitoring (BLOOD PRESSURE CUFF) MISC Take blood pressure daily 1 each 0   Cholecalciferol 50 MCG (2000 UT) CAPS Take 2,000 Units by mouth daily.     Coenzyme Q10 (COQ10) 50 MG CAPS Take 50 mg by mouth daily.     cyclobenzaprine (FEXMID) 7.5 MG tablet Take 7.5 mg by mouth 3 (  three) times daily as needed for muscle spasms.     famotidine (PEPCID) 10 MG tablet Take 10 mg by mouth daily as needed for heartburn or indigestion.     glucosamine-chondroitin 500-400 MG tablet Take 1 tablet by mouth as directed.     meclizine (ANTIVERT) 25 MG tablet Take 25 mg by mouth 3 (three) times daily as needed.     metoprolol succinate (TOPROL-XL) 50 MG 24 hr tablet Take 1 tablet (50 mg total) by mouth daily. 90 tablet 3    Multiple Vitamins-Minerals (PRESERVISION AREDS 2+MULTI VIT PO) Take 1 tablet by mouth daily.     rOPINIRole (REQUIP) 0.25 MG tablet Take 1 tablet (0.25 mg total) by mouth 2 (two) times daily. (Patient taking differently: Take 0.25 mg by mouth at bedtime.)     simvastatin (ZOCOR) 10 MG tablet TAKE 1 TABLET AT BEDTIME 90 tablet 3   No current facility-administered medications for this visit.    OBJECTIVE: Vitals:   03/19/23 0925  BP: (!) 170/73  Pulse: 77  Resp: 16  Temp: 98.1 F (36.7 C)  SpO2: 99%     Body mass index is 20.3 kg/m.    ECOG FS:1 - Symptomatic but completely ambulatory  General: Well-developed, well-nourished, no acute distress.  Sitting in a wheelchair. Eyes: Pink conjunctiva, anicteric sclera. HEENT: Normocephalic, moist mucous membranes. Lungs: No audible wheezing or coughing. Heart: Regular rate and rhythm. Abdomen: Soft, nontender, no obvious distention. Musculoskeletal: No edema, cyanosis, or clubbing. Neuro: Alert, answering all questions appropriately. Cranial nerves grossly intact. Skin: No rashes or petechiae noted. Psych: Normal affect.  LAB RESULTS:  Lab Results  Component Value Date   NA 137 02/14/2023   K 4.1 02/14/2023   CL 103 02/14/2023   CO2 24 02/14/2023   GLUCOSE 104 (H) 02/14/2023   BUN 41 (H) 02/14/2023   CREATININE 0.97 02/14/2023   CALCIUM 9.5 02/14/2023   PROT 8.3 (H) 02/14/2023   ALBUMIN 4.1 02/14/2023   AST 47 (H) 02/14/2023   ALT 21 02/14/2023   ALKPHOS 59 02/14/2023   BILITOT 0.5 02/14/2023   GFRNONAA 55 (L) 02/14/2023   GFRAA >60 08/31/2019    Lab Results  Component Value Date   WBC 3.6 (L) 03/19/2023   NEUTROABS 1.8 03/19/2023   HGB 8.2 (L) 03/19/2023   HCT 24.6 (L) 03/19/2023   MCV 100.4 (H) 03/19/2023   PLT 62 (L) 03/19/2023   Lab Results  Component Value Date   IRON 64 02/14/2023   TIBC 323 02/14/2023   IRONPCTSAT 20 02/14/2023   Lab Results  Component Value Date   FERRITIN 81 02/14/2023        STUDIES: No results found.  ASSESSMENT: Anemia, thrombocytopenia.  PLAN:    Anemia: Chronic and unchanged.  Patient's most recent hemoglobin is 8.2, but she may be more symptomatic.  She agreed to 1 unit packed red blood cells tomorrow.  Iron stores continue to be within normal limits. Given her chronic thrombocytopenia, suspect patient has an underlying bone marrow disorder but this would take a bone marrow biopsy to diagnose which we will not do.  Return to clinic as previously scheduled for repeat laboratory work and further evaluation. Thrombocytopenia: Chronic and unchanged.  Patient's platelet count has ranged from 41-80 since December 21, 2021.  Current complaint count is 62. MGUS: Clinically insignificant.  Patient had SPEP completed at [facility] where M spike was reported 2.0.  This is slightly higher than her baseline which ranged between 0.9 - 1.4 since 2017.  No bone marrow biopsy as above.  No further monitoring necessary. Monoclonal B-cell population: Patient noted to have lymphadenopathy on CT scans at [facility].  She also had a peripheral blood flow cytometry that revealed a CD5 positive, CD23 negative monoclonal B-cell population representing 14% of her peripheral white blood cells.  This is consistent with a non-CLL type monoclonal B-cell.  Patient does not require biopsy.  No further interventions are needed.   Patient expressed understanding and was in agreement with this plan. She also understands that She can call clinic at any time with any questions, concerns, or complaints.    [name], MD   03/19/2023 9:52 AM

## 2023-03-20 ENCOUNTER — Inpatient Hospital Stay: Payer: Medicare Other

## 2023-03-20 DIAGNOSIS — D509 Iron deficiency anemia, unspecified: Secondary | ICD-10-CM

## 2023-03-20 DIAGNOSIS — D649 Anemia, unspecified: Secondary | ICD-10-CM | POA: Diagnosis not present

## 2023-03-20 MED ORDER — SODIUM CHLORIDE 0.9% IV SOLUTION
250.0000 mL | Freq: Once | INTRAVENOUS | Status: AC
  Administered 2023-03-20: 250 mL via INTRAVENOUS
  Filled 2023-03-20: qty 250

## 2023-03-20 MED ORDER — DIPHENHYDRAMINE HCL 50 MG/ML IJ SOLN
25.0000 mg | Freq: Once | INTRAMUSCULAR | Status: AC
  Administered 2023-03-20: 25 mg via INTRAVENOUS
  Filled 2023-03-20: qty 1

## 2023-03-20 MED ORDER — SODIUM CHLORIDE 0.9% FLUSH
10.0000 mL | INTRAVENOUS | Status: AC | PRN
  Administered 2023-03-20: 10 mL
  Filled 2023-03-20: qty 10

## 2023-03-20 NOTE — Progress Notes (Signed)
Patient states she is taking Percocet and was "told don't take tylenol". MD advise hold Tylenol and give Benadryl pre meds prior to 1 unit RBC

## 2023-03-20 NOTE — Patient Instructions (Signed)

## 2023-03-21 LAB — TYPE AND SCREEN
ABO/RH(D): O POS
Antibody Screen: NEGATIVE
Unit division: 0

## 2023-03-21 LAB — BPAM RBC
Blood Product Expiration Date: 202410292359
ISSUE DATE / TIME: 202410021055
Unit Type and Rh: 202410292359
Unit Type and Rh: 5100

## 2023-04-02 ENCOUNTER — Emergency Department: Payer: Medicare Other

## 2023-04-02 ENCOUNTER — Other Ambulatory Visit: Payer: Self-pay

## 2023-04-02 ENCOUNTER — Emergency Department
Admission: EM | Admit: 2023-04-02 | Discharge: 2023-04-02 | Disposition: A | Discharge status: Home / Self Care | Payer: Medicare Other | Attending: Emergency Medicine | Admitting: Emergency Medicine

## 2023-04-02 DIAGNOSIS — Z79899 Other long term (current) drug therapy: Secondary | ICD-10-CM | POA: Insufficient documentation

## 2023-04-02 DIAGNOSIS — I110 Hypertensive heart disease with heart failure: Secondary | ICD-10-CM | POA: Diagnosis not present

## 2023-04-02 DIAGNOSIS — I5032 Chronic diastolic (congestive) heart failure: Secondary | ICD-10-CM | POA: Insufficient documentation

## 2023-04-02 DIAGNOSIS — Z8673 Personal history of transient ischemic attack (TIA), and cerebral infarction without residual deficits: Secondary | ICD-10-CM | POA: Diagnosis not present

## 2023-04-02 DIAGNOSIS — R531 Weakness: Secondary | ICD-10-CM | POA: Diagnosis present

## 2023-04-02 DIAGNOSIS — Z1152 Encounter for screening for COVID-19: Secondary | ICD-10-CM | POA: Insufficient documentation

## 2023-04-02 DIAGNOSIS — Z7982 Long term (current) use of aspirin: Secondary | ICD-10-CM | POA: Diagnosis not present

## 2023-04-02 DIAGNOSIS — R0602 Shortness of breath: Secondary | ICD-10-CM | POA: Diagnosis not present

## 2023-04-02 LAB — COMPREHENSIVE METABOLIC PANEL
ALT: 16 U/L (ref 0–44)
AST: 36 U/L (ref 15–41)
Albumin: 3.9 g/dL (ref 3.5–5.0)
Alkaline Phosphatase: 59 U/L (ref 38–126)
Anion gap: 8 (ref 5–15)
BUN: 32 mg/dL — ABNORMAL HIGH (ref 8–23)
CO2: 26 mmol/L (ref 22–32)
Calcium: 9.2 mg/dL (ref 8.9–10.3)
Chloride: 100 mmol/L (ref 98–111)
Creatinine, Ser: 0.97 mg/dL (ref 0.44–1.00)
GFR, Estimated: 55 mL/min — ABNORMAL LOW (ref >60)
Glucose, Bld: 115 mg/dL — ABNORMAL HIGH (ref 70–99)
Potassium: 3.6 mmol/L (ref 3.5–5.1)
Sodium: 134 mmol/L — ABNORMAL LOW (ref 135–145)
Total Bilirubin: 1.7 mg/dL — ABNORMAL HIGH (ref 0.3–1.2)
Total Protein: 8.4 g/dL — ABNORMAL HIGH (ref 6.5–8.1)

## 2023-04-02 LAB — URINALYSIS, W/ REFLEX TO CULTURE (INFECTION SUSPECTED)
Bilirubin Urine: NEGATIVE
Glucose, UA: NEGATIVE mg/dL
Hgb urine dipstick: NEGATIVE
Ketones, ur: NEGATIVE mg/dL
Nitrite: NEGATIVE
Protein, ur: 30 mg/dL — AB
Specific Gravity, Urine: 1.024 (ref 1.005–1.030)
pH: 5 (ref 5.0–8.0)

## 2023-04-02 LAB — CBC WITH DIFFERENTIAL/PLATELET
Abs Immature Granulocytes: 0.02 10*3/uL (ref 0.00–0.07)
Basophils Absolute: 0 10*3/uL (ref 0.0–0.1)
Basophils Relative: 0 %
Eosinophils Absolute: 0 10*3/uL (ref 0.0–0.5)
Eosinophils Relative: 0 %
HCT: 26.8 % — ABNORMAL LOW (ref 36.0–46.0)
Hemoglobin: 9.3 g/dL — ABNORMAL LOW (ref 12.0–15.0)
Immature Granulocytes: 1 %
Lymphocytes Relative: 47 %
Lymphs Abs: 1.6 10*3/uL (ref 0.7–4.0)
MCH: 33.6 pg (ref 26.0–34.0)
MCHC: 34.7 g/dL (ref 30.0–36.0)
MCV: 96.8 fL (ref 80.0–100.0)
Monocytes Absolute: 0.3 10*3/uL (ref 0.1–1.0)
Monocytes Relative: 10 %
Neutro Abs: 1.4 10*3/uL — ABNORMAL LOW (ref 1.7–7.7)
Neutrophils Relative %: 42 %
Platelets: 48 10*3/uL — ABNORMAL LOW (ref 150–400)
RBC: 2.77 MIL/uL — ABNORMAL LOW (ref 3.87–5.11)
RDW: 14.2 % (ref 11.5–15.5)
WBC: 3.4 10*3/uL — ABNORMAL LOW (ref 4.0–10.5)
nRBC: 0 % (ref 0.0–0.2)

## 2023-04-02 LAB — TYPE AND SCREEN
ABO/RH(D): O POS
Antibody Screen: NEGATIVE

## 2023-04-02 LAB — TROPONIN I (HIGH SENSITIVITY)
Troponin I (High Sensitivity): 12 ng/L (ref <18)
Troponin I (High Sensitivity): 12 ng/L (ref <18)

## 2023-04-02 LAB — LACTIC ACID, PLASMA: Lactic Acid, Venous: 1 mmol/L (ref 0.5–1.9)

## 2023-04-02 LAB — RESP PANEL BY RT-PCR (RSV, FLU A&B, COVID)  RVPGX2
Influenza A by PCR: NEGATIVE
Influenza B by PCR: NEGATIVE
Resp Syncytial Virus by PCR: NEGATIVE
SARS Coronavirus 2 by RT PCR: NEGATIVE

## 2023-04-02 MED ORDER — ACETAMINOPHEN 325 MG PO TABS
650.0000 mg | ORAL_TABLET | Freq: Once | ORAL | Status: AC
  Administered 2023-04-02: 650 mg via ORAL
  Filled 2023-04-02: qty 2

## 2023-04-02 MED ORDER — IOHEXOL 350 MG/ML SOLN
75.0000 mL | Freq: Once | INTRAVENOUS | Status: AC | PRN
  Administered 2023-04-02: 75 mL via INTRAVENOUS

## 2023-04-02 NOTE — ED Notes (Signed)
Pt assisted by this RN for ambulatory sats. Pt sats maintained 95-100%. Pt RR increased to 40-50s. Pt endorses SOB w/ exertion. Pt denies any other complaints upon ambulating. EDP made aware.

## 2023-04-02 NOTE — ED Notes (Signed)
Pt assisted to bathroom with 1 person assist. Urine specimen obtained. Pt assisted back into bed and placed back onto monitor.

## 2023-04-02 NOTE — Discharge Instructions (Signed)
Please seek medical attention for any high fevers, chest pain, shortness of breath, change in behavior, persistent vomiting, bloody stool or any other new or concerning symptoms.  

## 2023-04-02 NOTE — ED Provider Notes (Signed)
[facility] Provider Note    Event Date/Time   First MD Initiated Contact with Patient 04/02/23 0559     (approximate)   History   Weakness   HPI  Alicia Moran is a 89 y.o. female brought to the ED ED via EMS from home with a chief complaint of generalized weakness for several days.  Patient with a history of iron deficiency anemia who received 1 unit PRBC 2 weeks ago by her hematologist for hemoglobin of 8.  Recent diagnosis of CLL after hospitalization at [facility].  Denies fever/chills, chest pain, shortness of breath, abdominal pain, nausea, vomiting or dizziness.  Denies black or bloody stools.     Past Medical History   Past Medical History:  Diagnosis Date   Hyperlipidemia    Migraine headache    Stroke [facility])      Active Problem List   Patient Active Problem List   Diagnosis Date Noted   Gastroenteritis 09/07/2022   Hyponatremia 09/07/2022   CVA (cerebral vascular accident) (HCC) 09/06/2022   Pyuria 09/06/2022   Dizziness 09/06/2022   Carotid stenosis 08/07/2022   Pancytopenia (HCC) 02/17/2022   Chronic diastolic CHF (congestive heart failure) (HCC) 12/23/2021   Moderate mitral regurgitation 12/23/2021   Mild aortic stenosis 12/23/2021   Arthritis of left knee 12/22/2021   Malnutrition of moderate degree 01/30/2021   Colitis due to Clostridium difficile 01/30/2021   Acute pain of right knee    Weakness 01/29/2021   Disorder of bursae of shoulder region 03/15/2020   Cervical spondylosis 06/30/2018   Muscle strain of left scapular region 06/30/2018   Degenerative tear of medial meniscus of left knee 03/12/2018   Rotator cuff tendinitis, right 03/12/2018   Stress fracture of right calcaneus 01/14/2018   Left knee pain 10/23/2017   Stress fracture of left femur with delayed healing 10/23/2017   Osteoarthritis of knee 09/26/2017   Dysuria 01/19/2017   Acute bronchitis 09/19/2016   HTN (hypertension) 09/13/2016   Monoclonal  gammopathy of unknown significance (MGUS) 09/11/2016   Fatigue 05/05/2016   Thrombocytopenia (HCC) 05/03/2016   Osteoporosis 03/20/2016   Valvular heart disease 12/23/2015   Constipation 09/04/2015   Perforation of right tympanic membrane 03/28/2015   Mild cerebral atrophy (HCC) 03/02/2015   Neuropathy 03/02/2015   History of falling, presenting hazards to health 07/17/2014   Iron deficiency anemia due to chronic blood loss 07/17/2014   Positive FIT (fecal immunochemical test) 07/17/2014   Atherosclerotic peripheral vascular disease (HCC) 07/17/2014   H/O: CVA (cerebrovascular accident) 06/14/2014   Subdural hematoma (HCC) 06/12/2014   Adjustment disorder with mixed anxiety and depressed mood 10/31/2013   Intertrochanteric fracture of right hip (HCC) 03/05/2013   Lens replaced by other means 04/25/2012   Senile cataract 04/25/2012   Severe stage glaucoma 12/01/2011   Paroxysmal atrial fibrillation (HCC)    Stroke (HCC)    Migraine headache    High cholesterol      Past Surgical History  History reviewed. No pertinent surgical history.   Home Medications   Prior to Admission medications   Medication Sig Start Date End Date Taking? Authorizing Provider  acetaminophen (TYLENOL) 500 MG tablet Take 500-1,000 mg by mouth every 6 (six) hours as needed (pain).    [provider]  ALPRAZolam Prudy Feeler) 0.25 MG tablet Take 0.5 tablets (0.125 mg total) by mouth 2 (two) times daily as needed for anxiety or sleep. 12/26/21   Lurene Shadow, MD  aspirin 81 MG tablet Take 81 mg  by mouth daily.    [provider]  Blood Pressure Monitoring (BLOOD PRESSURE CUFF) MISC Take blood pressure daily 02/04/23 02/10/24  [name], MD  Cholecalciferol 50 MCG (2000 UT) CAPS Take 2,000 Units by mouth daily.    [provider]  Coenzyme Q10 (COQ10) 50 MG CAPS Take 50 mg by mouth daily.    [provider]  cyclobenzaprine (FEXMID) 7.5 MG tablet Take 7.5 mg by mouth 3  (three) times daily as needed for muscle spasms.    [provider]  famotidine (PEPCID) 10 MG tablet Take 10 mg by mouth daily as needed for heartburn or indigestion.    [provider]  glucosamine-chondroitin 500-400 MG tablet Take 1 tablet by mouth as directed.    [provider]  meclizine (ANTIVERT) 25 MG tablet Take 25 mg by mouth 3 (three) times daily as needed. 12/05/21   [provider]  metoprolol succinate (TOPROL-XL) 50 MG 24 hr tablet Take 1 tablet (50 mg total) by mouth daily. 01/23/23   [name], MD  Multiple Vitamins-Minerals (PRESERVISION AREDS 2+MULTI VIT PO) Take 1 tablet by mouth daily.    [provider]  rOPINIRole (REQUIP) 0.25 MG tablet Take 1 tablet (0.25 mg total) by mouth 2 (two) times daily. Patient taking differently: Take 0.25 mg by mouth at bedtime. 12/26/21   Lurene Shadow, MD  simvastatin (ZOCOR) 10 MG tablet TAKE 1 TABLET AT BEDTIME 01/30/23   [name], MD     Allergies  Erythromycin and Nsaids   Family History   Family History  Problem Relation Age of Onset   Hypertension Mother    Diabetes Mother    Hypertension Father      Physical Exam  Triage Vital Signs: ED Triage Vitals  Encounter Vitals Group     BP 04/02/23 0547 (!) 135/97     Systolic BP Percentile --      Diastolic BP Percentile --      Pulse Rate 04/02/23 0547 84     Resp 04/02/23 0547 20     Temp 04/02/23 0547 98.2 F (36.8 C)     Temp Source 04/02/23 0547 Oral     SpO2 04/02/23 0547 99 %     Weight 04/02/23 0548 117 lb (53.1 kg)     Height 04/02/23 0548 5\' 2"  (1.575 m)     Head Circumference --      Peak Flow --      Pain Score 04/02/23 0547 0     Pain Loc --      Pain Education --      Exclude from Growth Chart --     Updated Vital Signs: BP 100/82   Pulse 84   Temp 98.2 F (36.8 C) (Oral)   Resp 19   Ht 5\' 2"  (1.575 m)   Wt 53.1 kg   SpO2 98%   BMI 21.40 kg/m    General: Awake, no distress.   CV:  RRR.  Good peripheral perfusion.  Resp:  Normal effort.  CTAB. Abd:  Nontender.  No distention.  Other:  Pale, cachectic.   ED Results / Procedures / Treatments  Labs (all labs ordered are listed, but only abnormal results are displayed) Labs Reviewed  CULTURE, BLOOD (ROUTINE X 2)  CULTURE, BLOOD (ROUTINE X 2)  RESP PANEL BY RT-PCR (RSV, FLU A&B, COVID)  [id number]  LACTIC ACID, PLASMA  LACTIC ACID, PLASMA  CBC WITH DIFFERENTIAL/PLATELET  COMPREHENSIVE METABOLIC PANEL  URINALYSIS, W/  REFLEX TO CULTURE (INFECTION SUSPECTED)  TYPE AND SCREEN  TROPONIN I (HIGH SENSITIVITY)     EKG  ED ECG REPORT I, Zarif Rathje J, the attending physician, personally viewed and interpreted this ECG.   Date: 04/02/2023  EKG Time: 0551  Rate: 84  Rhythm: normal sinus rhythm  Axis: Normal  Intervals:none  ST&T Change: Nonspecific    RADIOLOGY Chest x-ray pending   Official radiology report(s): No results found.   PROCEDURES:  Critical Care performed: No  .1-3 Lead EKG Interpretation  Performed by: Irean Hong, MD Authorized by: Irean Hong, MD     Interpretation: normal     ECG rate:  85   ECG rate assessment: normal     Rhythm: sinus rhythm     Ectopy: none     Conduction: normal   Comments:     Patient placed on cardiac monitor to evaluate for arrhythmias    MEDICATIONS ORDERED IN ED: Medications - No data to display   IMPRESSION / MDM / ASSESSMENT AND PLAN / ED COURSE  I reviewed the triage vital signs and the nursing notes.                             89 year old female presenting with generalized weakness.  Differential diagnosis includes but is not limited to anemia, ACS, infectious, metabolic etiologies, etc.  I personally reviewed patient's records and note her hematology office visit on 03/19/2023 with Dr. Orlie Dakin.  Patient's presentation is most consistent with acute presentation with potential threat to life or bodily function.  The patient is on the  cardiac monitor to evaluate for evidence of arrhythmia and/or significant heart rate changes.  Will obtain septic workup, type and screen, respiratory panel, UA, chest x-ray.  Clinical Course as of 04/02/23 0703  Tue Apr 02, 2023  0703 Care transferred to Dr. Derrill Kay at change of shift pending results and disposition. [JS]    Clinical Course User Index [JS] Irean Hong, MD     FINAL CLINICAL IMPRESSION(S) / ED DIAGNOSES   Final diagnoses:  Generalized weakness     Rx / DC Orders   ED Discharge Orders     None        Note:  This document was prepared using Dragon voice recognition software and may include unintentional dictation errors.   Irean Hong, MD 04/02/23 [phone]

## 2023-04-02 NOTE — ED Notes (Signed)
Alicia Frame, MD messaged via secure chat as patient is requesting pain medicine for her arthritic back pain.

## 2023-04-02 NOTE — ED Triage Notes (Addendum)
Pt to ED via EMS from home, per ems pt has had weakness x few days. Pt reports hx of same and having low HGB levels. Pt denies chest pain or shortness of breath. Denies dysuria or bleeding from rectum.

## 2023-04-02 NOTE — ED Notes (Addendum)
Orthostatic VS obtained and pt assisted to bathroom. Pt also reporting SOB with exertion that has been new x3-4wks. EDP made aware.

## 2023-04-02 NOTE — ED Provider Notes (Signed)
Patient's work up in the emergency department without clear etiology of weakness. Patient states to me that her weakness has been present on and off for weeks. I did obtain a CT scan to evaluate for any occult pneumonia or PE and this was negative. COVID/flu negative. Blood work without concerning electrolyte abnormality. While patient is anemic her blood level is better today then it has been over the past few months per chart review. Patient was able to ambulate without any desaturation. At this time again unclear etiology of weakness but given reassuring work up I feel it is reasonable for patient to continue work up with outpatient providers. I discussed findings with patient. Answered all questions to the best of my ability.    Phineas Semen, MD 04/02/23 [phone]

## 2023-04-03 ENCOUNTER — Telehealth: Payer: Self-pay | Admitting: Cardiovascular Disease

## 2023-04-03 NOTE — Telephone Encounter (Signed)
The patient called, stating that for the past week she has been experiencing shortness of breath with exertion. She reported that she is only able to walk a short distance with her walker before needing to sit down due to SOB and exhaustion. The patient denies any swelling and mentioned that she doesn't weigh herself daily. Additionally, she reported experiencing episodes of rapid heartbeat, with one episode waking her from sleep yesterday. She went to the emergency room for evaluation but was informed that they were unable to determine the cause of her symptoms.  An appointment is scheduled for tomorrow, 04/04/23, with Eula Listen for further evaluation. The patient has also been made aware of emergency department precautions should any new symptoms develop or worsen

## 2023-04-03 NOTE — Progress Notes (Unsigned)
Cardiology Office Note    Date:  04/04/2023   ID:  Alicia Moran, DOB 09-13-1928, MRN [id number]  PCP:  [name], MD  Cardiologist:  Lorine Bears, MD  Electrophysiologist:  None   Chief Complaint: ED follow-up  History of Present Illness:   Alicia Moran is a 89 y.o. 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  LHC at [facility] in 2006 showed no obstructive CAD.  PAF has previously been mentioned in her chart from [facility], however we have not been able to find conclusive evidence of this arrhythmia.  Prior documentation indicates that she suffered a stroke in 2008, despite being on Plavix and was started on anticoagulation with warfarin at that time, for that indication.  She suffered a subdural hematoma in 2015 after a fall leading to discontinuation of warfarin.  The patient suffered from significant depression and grief following the passing of her daughter in 05/2017.  Most recent echo from 09/2021 showed an EF of 60 to 65%, no regional wall motion abnormalities, grade 2 diastolic dysfunction, normal RV systolic function and ventricular cavity size, mildly elevated PASP estimated at 43.9 mmHg, moderate mitral regurgitation, moderate tricuspid regurgitation, mild aortic insufficiency, mild aortic stenosis, mild dilatation of the aortic root measuring 39 mm, and borderline dilatation of the ascending aorta measuring 36 mm.  She was most recently seen in the office in 01/2023 reporting fatigue and decreased energy and was without symptoms of angina or cardiac decompensation.  She was seen in the [facility] ED on 04/02/2023 with a long history of generalized malaise and fatigue and an approximate 3 to 4-day history of shortness of breath with associated palpitations.  No frank chest pain.   High-sensitivity troponin negative x 2.  COVID, influenza, and RSV negative.  Blood culture no growth to date x 2.  Remaining labs as outlined below.  Chest x-ray showed no acute cardiopulmonary process.  CTA chest showed no evidence of PE with cervical, axillary, and mediastinal lymphadenopathy compatible with known his CLL as well as aortic atherosclerosis.  EKG showed sinus rhythm with no acute ST-T changes.  No specific etiology to the patient's generalized weakness was identified with recommendation for the patient to follow-up in the outpatient setting.  She subsequently called our office on 04/03/2023 reporting a 1 week history of exertional shortness of breath and fatigue.  She also reported experiencing episodes of tachypalpitations.  In this setting, appointment was made for today.  She comes in today reporting a long history of generalized malaise and fatigue that seems to be worse over the past week.  However, over the past week she has reported an increase in shortness of breath with increase in exertional fatigue as well as palpitations.  No frank chest pain.  No lower extremity swelling, abdominal distention, orthopnea.  She reports a long history of diminished appetite that dates back to the passing of her husband and daughter.  She reports that she continues to actively grieve the loss of her family members.  No falls or symptoms concerning for bleeding.  No dizziness, presyncope, or syncope.  She wonders if she needs to take Xanax.   Labs independently reviewed: 03/2023 - potassium 3.6, BUN 32, serum creatinine 0.97, albumin 3.9, AST/ALT normal, WBC 3.4, Hgb 9.3, PLT 48 06/2022 - TC 119, TG 52, HDL 57, LDL 50  03/2022 - TSH normal  Past Medical History:  Diagnosis Date   Hyperlipidemia    Lymphoma (HCC) 02/2023   Migraine headache    Stroke [facility])     History reviewed. No pertinent surgical history.  Current Medications: Current Meds  Medication Sig   acetaminophen (TYLENOL) 500  MG tablet Take 500-1,000 mg by mouth every 6 (six) hours as needed (pain).   ALPRAZolam (XANAX) 0.25 MG tablet Take 0.5 tablets (0.125 mg total) by mouth 2 (two) times daily as needed for anxiety or sleep.   aspirin 81 MG tablet Take 81 mg by mouth daily.   Blood Pressure Monitoring (BLOOD PRESSURE CUFF) MISC Take blood pressure daily   Cholecalciferol 50 MCG (2000 UT) CAPS Take 2,000 Units by mouth daily.   Coenzyme Q10 (COQ10) 50 MG CAPS Take 50 mg by mouth daily.   famotidine (PEPCID) 10 MG tablet Take 10 mg by mouth daily as needed for heartburn or indigestion.   glucosamine-chondroitin 500-400 MG tablet Take 1 tablet by mouth as directed.   meclizine (ANTIVERT) 25 MG tablet Take 25 mg by mouth 3 (three) times daily as needed.   metoprolol succinate (TOPROL-XL) 50 MG 24 hr tablet Take 1 tablet (50 mg total) by mouth daily.   Multiple Vitamins-Minerals (PRESERVISION AREDS 2+MULTI VIT PO) Take 1 tablet by mouth daily.   rOPINIRole (REQUIP) 0.25 MG tablet Take 1 tablet (0.25 mg total) by mouth 2 (two) times daily. (Patient taking differently: Take 0.25 mg by mouth at bedtime.)   simvastatin (ZOCOR) 10 MG tablet TAKE 1 TABLET AT BEDTIME    Allergies:   Erythromycin and Nsaids   Social History   Socioeconomic History   Marital status: Widowed    Spouse name: Not on file   Number of children: Not on file   Years of education: Not on file   Highest education level: Not on file  Occupational History   Not on file  Tobacco Use   Smoking status: Former    Current packs/day: 0.00    Types: Cigarettes    Quit date: 03/29/1986    Years since quitting: 37.0   Smokeless tobacco: Never  Substance and Sexual Activity   Alcohol use: No   Drug use: No   Sexual activity: Not Currently  Other Topics Concern   Not on file  Social History Narrative   Not on file   Social Determinants of Health   Financial Resource Strain: Low Risk  (02/26/2023)   Received [facility]   Overall  Financial Resource Strain (CARDIA)    Difficulty of Paying Living Expenses: Not hard at all  Food Insecurity: No Food Insecurity (02/26/2023)   Received from [facility]   Hunger Vital Sign    Worried About Running Out of Food in the Last Year: Never true    Ran Out of Food in the Last Year: Never true  Transportation Needs: No Transportation Needs (02/26/2023)   Received from [facility]   PRAPARE - Transportation    Lack of Transportation (Medical): No    Lack of Transportation (Non-Medical): No  Physical Activity: Not on file  Stress: Not on file  Social Connections: Not on file     Family History:  The patient's family history includes Diabetes in her mother; Hypertension in her father and mother.  ROS:   12-point review of systems is negative unless otherwise noted in the HPI.   EKGs/Labs/Other Studies Reviewed:    Studies reviewed were summarized above.  The additional studies were reviewed today:  2D Oct 18, 2021: 1. Left ventricular ejection fraction, by estimation, is 60 to 65%. The  left ventricle has normal function. The left ventricle has no regional  wall motion abnormalities. Left ventricular diastolic parameters are  consistent with Grade II diastolic  dysfunction (pseudonormalization). The average left ventricular global  longitudinal strain is -19.0 %.   2. Right ventricular systolic function is normal. The right ventricular  size is normal. There is mildly elevated pulmonary artery systolic  pressure. The estimated right ventricular systolic pressure is 43.9 mmHg.   3. The mitral valve is normal in structure. Moderate mitral valve  regurgitation. No evidence of mitral stenosis.   4. Tricuspid valve regurgitation is moderate.   5. The aortic valve is normal in structure. There is moderate  calcification of the aortic valve. Aortic valve regurgitation is mild.  Mild aortic valve stenosis. Aortic valve area, by VTI measures 1.51 cm.  Aortic valve mean  gradient measures 14.4 mmHg.  Aortic valve Vmax measures 2.42 m/s.   6. There is mild dilatation of the aortic root, measuring 39 mm. There is  borderline dilatation of the ascending aorta, measuring 36 mm.   7. The inferior vena cava is dilated in size with >50% respiratory  variability, suggesting right atrial pressure of 8 mmHg.   Comparison(s): LVEF 60-65%, Mod MR, Mild AS/AI.  __________  2D echo 09/10/2019: 1. Left ventricular ejection fraction, by estimation, is 60 to 65%. The  left ventricle has normal function. The left ventricle has no regional  wall motion abnormalities. Left ventricular diastolic parameters are  consistent with Grade I diastolic  dysfunction (impaired relaxation).   2. Right ventricular systolic function is normal. The right ventricular  size is normal. There is mildly elevated pulmonary artery systolic  pressure. The estimated right ventricular systolic pressure is 37.5 mmHg.   3. Left atrial size was mildly dilated.   4. The mitral valve is normal in structure. Moderate mitral valve  regurgitation. No evidence of mitral stenosis.   5. Tricuspid valve regurgitation is moderate.   6. Mild aortic valve stenosis.Moderately calcified leaflets. Mean  gradient of 13 mm Hg. Estimated AVA appears to overestimated valve area,  visually appears at least mild stenosis. Mild AI.   7. There is borderline dilatation of the aortic root measuring 37 mm.  __________  2D echo 07/23/2017: - Left ventricle: Systolic function was normal. The estimated    ejection fraction was in the range of 60% to 65%. Doppler    parameters are consistent with abnormal left ventricular    relaxation (grade 1 diastolic dysfunction).  - Aortic valve: Sclerosis without stenosis. There was mild    regurgitation.  - Aortic root: The aortic root was mildly dilated.  - Mitral valve: Calcified annulus. There was mild regurgitation.  - Right ventricle: The cavity size was normal. Systolic function     was normal.  - Tricuspid valve: Mildly thickened leaflets. There was    mild-moderate regurgitation.  - Pulmonary arteries: Systolic pressure was at the upper limits of    normal, in the range of 30 mm Hg to 35 mm Hg.    EKG:  EKG is not ordered today given unchanged symptoms.  The EKG ordered in the ED 2 days prior was reviewed.  Recent Labs: 04/02/2023: ALT 16; BUN 32; Creatinine, Ser 0.97; Hemoglobin 9.3; Platelets 48; Potassium 3.6; Sodium 134  Recent Lipid Panel    Component Value Date/Time   CHOL 121 08/26/2015 0941  TRIG 48.0 08/26/2015 0941   HDL 52.00 08/26/2015 0941   CHOLHDL 2 08/26/2015 0941   VLDL 9.6 08/26/2015 0941   LDLCALC 60 08/26/2015 0941    PHYSICAL EXAM:    VS:  BP 137/77 (BP Location: Left Arm, Patient Position: Sitting, Cuff Size: Normal)   Pulse 98   Ht 5\' 2"  (1.575 m)   Wt 112 lb (50.8 kg)   SpO2 99%   BMI 20.49 kg/m   BMI: Body mass index is 20.49 kg/m.  Physical Exam Vitals reviewed.  Constitutional:      General: She is awake. She is not in acute distress.    Appearance: She is underweight. She is not ill-appearing, toxic-appearing or diaphoretic.     Comments: Elderly-appearing.  HENT:     Head: Normocephalic and atraumatic.     Right Ear: Decreased hearing noted.     Left Ear: Decreased hearing noted.  Eyes:     General:        Right eye: No discharge.        Left eye: No discharge.  Neck:     Vascular: No JVD.  Cardiovascular:     Rate and Rhythm: Normal rate and regular rhythm.     Heart sounds: S1 normal and S2 normal. Heart sounds not distant. No midsystolic click and no opening snap. Murmur heard.     Systolic murmur is present with a grade of 2/6 at the upper right sternal border.     Systolic murmur of grade 2/6 is also present at the apex.     No friction rub.  Pulmonary:     Effort: Pulmonary effort is normal. No respiratory distress.     Breath sounds: Normal breath sounds. No decreased breath sounds, wheezing,  rhonchi or rales.  Chest:     Chest wall: No tenderness.  Abdominal:     General: There is no distension.     Palpations: Abdomen is soft.     Tenderness: There is no abdominal tenderness.  Musculoskeletal:     Cervical back: Normal range of motion.     Right lower leg: No edema.     Left lower leg: No edema.  Skin:    General: Skin is warm and dry.     Nails: There is no clubbing.  Neurological:     Mental Status: She is alert and oriented to person, place, and time.  Psychiatric:        Speech: Speech normal.        Behavior: Behavior normal. Behavior is cooperative.        Thought Content: Thought content normal.        Judgment: Judgment normal.     Wt Readings from Last 3 Encounters:  04/04/23 112 lb (50.8 kg)  04/02/23 117 lb (53.1 kg)  03/19/23 111 lb (50.3 kg)     ASSESSMENT & PLAN:   Dyspnea/palpitations/generalized malaise and fatigue: She has a long history of generalized malaise and fatigue which is likely multifactorial including advanced age, significant comorbidities including transfusion dependent anemia, and physical deconditioning exacerbated by diminished hearing and vision.  However, over the past week she has noted an increase in shortness of breath, fatigue, and palpitations.  Workup in the ER was reassuring including negative high-sensitivity troponins, nonacute EKG, and CTA chest without evidence of PE.  Obtain echo to evaluate for new cardiomyopathy.  However, if newly reduced LV systolic function is identified she would not be a candidate for invasive cardiac testing given transfusion  dependent anemia with significant pancytopenia.  With palpitations we will obtain Zio patch, however if rhythm such as atrial fibrillation is identified she would not be a candidate for [facility] or [facility] given advanced age and significant comorbid conditions as previously outlined.  Do not anticipate further cardiac testing outside of the above; otherwise ongoing evaluation and  management per PCP.  Moderate valvular heart disease: She has known moderate mitral regurgitation and mild aortic stenosis.  Most recent echo from 2023 was stable overall.  Not a candidate for invasive valvular intervention given advanced age and significant comorbidities including significant pancytopenia with transfusion dependent anemia and thrombocytopenia with a platelet count of 48,000.  However, we will update an echo to ensure no new cardiomyopathy as outlined above.  CLL with pancytopenia with transfusion dependent anemia: Suspect that this is contributing a fair amount to her generalized malaise, fatigue, and dyspnea.  Ongoing transfusion dependent anemia and significant thrombocytopenia preclude the ability for invasive ischemic testing.  Patient would benefit from a GOC discussion, perhaps PCP could help facilitate this.  Prior CVA: No new deficits.  Remains on aspirin 81 mg and simvastatin 10 mg.  No documented evidence of A-fib available for review.  HTN: Blood pressure is reasonably controlled in the office today on Toprol-XL 50 mg.  Lower extremity edema: No edema appreciated on exam today.    Disposition: F/u with Dr. Kirke Corin or an APP in 2 months.   Medication Adjustments/Labs and Tests Ordered: Current medicines are reviewed at length with the patient today.  Concerns regarding medicines are outlined above. Medication changes, Labs and Tests ordered today are summarized above and listed in the Patient Instructions accessible in Encounters.   Signed, Eula Listen, PA-C 04/04/2023 4:25 PM     [facility] [address] [place], [place] [address] [phone]

## 2023-04-03 NOTE — Telephone Encounter (Signed)
Pt c/o Shortness Of Breath: STAT if SOB developed within the last 24 hours or pt is noticeably SOB on the phone  1. Are you currently SOB (can you hear that pt is SOB on the phone)? Yes  2. How long have you been experiencing SOB? About a week now   3. Are you SOB when sitting or when up moving around? Moving around   4. Are you currently experiencing any other symptoms? Feeling very weak

## 2023-04-04 ENCOUNTER — Encounter: Payer: Self-pay | Admitting: Physician Assistant

## 2023-04-04 ENCOUNTER — Ambulatory Visit: Payer: Medicare Other

## 2023-04-04 ENCOUNTER — Ambulatory Visit: Payer: Medicare Other | Attending: Physician Assistant | Admitting: Physician Assistant

## 2023-04-04 VITALS — BP 137/77 | HR 98 | Ht 62.0 in | Wt 112.0 lb

## 2023-04-04 DIAGNOSIS — R5381 Other malaise: Secondary | ICD-10-CM

## 2023-04-04 DIAGNOSIS — I359 Nonrheumatic aortic valve disorder, unspecified: Secondary | ICD-10-CM

## 2023-04-04 DIAGNOSIS — R5382 Chronic fatigue, unspecified: Secondary | ICD-10-CM

## 2023-04-04 DIAGNOSIS — Z8673 Personal history of transient ischemic attack (TIA), and cerebral infarction without residual deficits: Secondary | ICD-10-CM

## 2023-04-04 DIAGNOSIS — R0602 Shortness of breath: Secondary | ICD-10-CM

## 2023-04-04 DIAGNOSIS — R002 Palpitations: Secondary | ICD-10-CM | POA: Diagnosis not present

## 2023-04-04 DIAGNOSIS — D61818 Other pancytopenia: Secondary | ICD-10-CM | POA: Diagnosis present

## 2023-04-04 DIAGNOSIS — C9110 Chronic lymphocytic leukemia of B-cell type not having achieved remission: Secondary | ICD-10-CM

## 2023-04-04 DIAGNOSIS — I10 Essential (primary) hypertension: Secondary | ICD-10-CM | POA: Diagnosis present

## 2023-04-04 DIAGNOSIS — I340 Nonrheumatic mitral (valve) insufficiency: Secondary | ICD-10-CM | POA: Diagnosis present

## 2023-04-04 NOTE — Patient Instructions (Signed)
Medication Instructions:  No changes at this time.   *If you need a refill on your cardiac medications before your next appointment, please call your pharmacy*   Lab Work: None  If you have labs (blood work) drawn today and your tests are completely normal, you will receive your results only by: MyChart Message (if you have MyChart) OR A paper copy in the mail If you have any lab test that is abnormal or we need to change your treatment, we will call you to review the results.   Testing/Procedures: Your physician has requested that you have an echocardiogram. Echocardiography is a painless test that uses sound waves to create images of your heart. It provides your doctor with information about the size and shape of your heart and how well your heart's chambers and valves are working. This procedure takes approximately one hour. There are no restrictions for this procedure. Please do NOT wear cologne, perfume, aftershave, or lotions (deodorant is allowed). Please arrive 15 minutes prior to your appointment time.  Your physician has recommended that you wear a Zio monitor.   This monitor is a medical device that records the heart's electrical activity. Doctors most often use these monitors to diagnose arrhythmias. Arrhythmias are problems with the speed or rhythm of the heartbeat. The monitor is a small device applied to your chest. You can wear one while you do your normal daily activities. While wearing this monitor if you have any symptoms to push the button and record what you felt. Once you have worn this monitor for the period of time provider prescribed (Usually 14 days), you will return the monitor device in the postage paid box. Once it is returned they will download the data collected and provide [place] with a report which the provider will then review and we will call you with those results. Important tips:  Avoid showering during the first 24 hours of wearing the monitor. Avoid  excessive sweating to help maximize wear time. Do not submerge the device, no hot tubs, and no swimming pools. Keep any lotions or oils away from the patch. After 24 hours you may shower with the patch on. Take brief showers with your back facing the shower head.  Do not remove patch once it has been placed because that will interrupt data and decrease adhesive wear time. Push the button when you have any symptoms and write down what you were feeling. Once you have completed wearing your monitor, remove and place into box which has postage paid and place in your outgoing mailbox.  If for some reason you have misplaced your box then call our office and we can provide another box and/or mail it off for you.      Follow-Up: At [facility], you and your health needs are our priority.  As part of our continuing mission to provide you with exceptional heart care, we have created designated Provider Care Teams.  These Care Teams include your primary Cardiologist (physician) and Advanced Practice Providers (APPs -  Physician Assistants and Nurse Practitioners) who all work together to provide you with the care you need, when you need it.  We recommend signing up for the patient portal called "MyChart".  Sign up information is provided on this After Visit Summary.  MyChart is used to connect with patients for Virtual Visits (Telemedicine).  Patients are able to view lab/test results, encounter notes, upcoming appointments, etc.  Non-urgent messages can be sent to your provider as well.  To learn more about what you can do with MyChart, go to [email or web address].    Your next appointment:   2 month(s)  Provider:   Lorine Bears, MD

## 2023-04-06 DIAGNOSIS — R002 Palpitations: Secondary | ICD-10-CM

## 2023-04-07 LAB — CULTURE, BLOOD (ROUTINE X 2)
Culture: NO GROWTH
Culture: NO GROWTH
Special Requests: ADEQUATE
Special Requests: ADEQUATE

## 2023-04-15 ENCOUNTER — Telehealth: Payer: Self-pay | Admitting: Physician Assistant

## 2023-04-15 NOTE — Telephone Encounter (Signed)
Noted  

## 2023-04-15 NOTE — Telephone Encounter (Signed)
Pt called in stating her heart monitor would not stay on and the company told her to send it back today and notify the provider. Please advise.

## 2023-04-15 NOTE — Telephone Encounter (Addendum)
The patient called to state that their heart monitor would not stay on. She has been advised to mail it back and we can see what information we were able to retain.  She does not want to wear another one.

## 2023-04-18 ENCOUNTER — Ambulatory Visit: Payer: Medicare Other | Attending: Physician Assistant

## 2023-04-18 DIAGNOSIS — R0602 Shortness of breath: Secondary | ICD-10-CM | POA: Diagnosis present

## 2023-04-18 LAB — ECHOCARDIOGRAM COMPLETE
AR max vel: 1.25 cm2
AV Area VTI: 1.25 cm2
AV Area mean vel: 1.13 cm2
AV Mean grad: 21 mm[Hg]
AV Peak grad: 36 mm[Hg]
Ao pk vel: 3 m/s
Area-P 1/2: 3.65 cm2
P 1/2 time: 559 ms
Single Plane A4C EF: 52.2 %

## 2023-04-19 ENCOUNTER — Encounter: Payer: Self-pay | Admitting: Emergency Medicine

## 2023-04-19 ENCOUNTER — Telehealth: Payer: Self-pay | Admitting: Cardiovascular Disease

## 2023-04-19 NOTE — Telephone Encounter (Signed)
Results and recommendations mailed to pt

## 2023-04-19 NOTE — Telephone Encounter (Signed)
Received ZIO Symptom Log via fax Printed and placed in Pam's nurse box

## 2023-04-19 NOTE — Telephone Encounter (Signed)
The patient has been notified of the results along with recommendations. Pt verbalized understanding. All questions were answered.  Pt concerned for lack of energy - especially in the morning. Lymphoma and infusions discussed.   Pt reminded of upcoming appt and encouraged to call if any concerns or questions arise

## 2023-04-19 NOTE — Telephone Encounter (Signed)
Patient would like a call back to discuss echo results. 

## 2023-04-22 NOTE — Telephone Encounter (Signed)
Log reviewed and placed on [employer] for scanning.

## 2023-04-23 ENCOUNTER — Telehealth: Payer: Self-pay | Admitting: Cardiovascular Disease

## 2023-04-23 ENCOUNTER — Ambulatory Visit: Payer: Medicare Other | Attending: Physician Assistant

## 2023-04-23 DIAGNOSIS — R002 Palpitations: Secondary | ICD-10-CM | POA: Diagnosis not present

## 2023-04-23 NOTE — Telephone Encounter (Signed)
Pt called and she reported that she never put on the second Zio (I asked pt to return the monitor)  Pt told a [employer] monitor patch would be ordered and sent to her house and pt agreed to try anohter

## 2023-04-23 NOTE — Addendum Note (Signed)
Addended by: Ursula Alert on: 04/23/2023 05:19 PM   Modules accepted: Orders

## 2023-04-23 NOTE — Telephone Encounter (Signed)
If possible, we can try a [employer] e-patch.  Diagnosis palpitations.

## 2023-04-23 NOTE — Progress Notes (Unsigned)
Enrolled for [employer] to ship a long term holter monitor to the patients address on file.   Dr. Terrilee Files to read.

## 2023-04-23 NOTE — Telephone Encounter (Signed)
Patient called stating she received another zio monitor because the last one wouldn't stay on and it's the same type.  She wants to know if there is a different type of monitor she can wear.  Like one that hangs around her neck.  Please advise.

## 2023-04-23 NOTE — Telephone Encounter (Signed)
The patient called stating she received a second monitor because the first one wouldn't stick. However, the patient mentioned that it's the same type. She is asking if we recommend a different monitor company that might stick better  Will forward to [place] for recommendations.

## 2023-04-29 ENCOUNTER — Encounter: Payer: Self-pay | Admitting: *Deleted

## 2023-04-29 NOTE — Progress Notes (Unsigned)
Patient reports calling in and was told that she could just come in to have someone assist with her monitor placement. It was a [employer] monitor which we do not use here in our office. Followed instructions provided and placed on patient. No further needs.

## 2023-05-23 ENCOUNTER — Telehealth: Payer: Self-pay | Admitting: Cardiovascular Disease

## 2023-05-23 NOTE — Telephone Encounter (Signed)
STAT call taken and Alicia Moran - [employer]) reports that pt heart monitor report available online and abnormal reading of:   "NEW ONSET AFIB"

## 2023-05-23 NOTE — Telephone Encounter (Signed)
Caller Ronaldo Miyamoto) is reporting abnormal results.

## 2023-05-23 NOTE — Telephone Encounter (Signed)
Please get strip faxed over for review and confirmation.  Patient was admitted to [facility] yesterday with symptomatic and transfusion dependent anemia with a hemoglobin of 6.8.  Patient would not be a candidate for [facility] and unlikely to be a candidate for [facility].  Patient will need to follow-up with the EP for further evaluation of A-fib after she is discharged from [facility].

## 2023-05-29 ENCOUNTER — Telehealth: Payer: Self-pay | Admitting: Cardiovascular Disease

## 2023-05-29 NOTE — Telephone Encounter (Signed)
Patient calling to get Dr. Kirke Corin to look at her notes from [facility]. She will like for him to give her a call to let her know what he thinks. Please advise

## 2023-05-29 NOTE — Telephone Encounter (Signed)
Called and spoke with the patient concerning her recent ED visit at [facility]. Minimum notes are in EPIC.  The patient would like for Dr. Kirke Corin to review the notes. The patient was found to be in Afib RVR but she is also anemic.   The patient has a follow up with Dr. Kirke Corin on 12/20 that she doesn't feel like she can make it due to feelings of fatigue and weakness. She stated that she also has cancer.

## 2023-05-31 NOTE — Telephone Encounter (Signed)
The patient has been made aware. She will call by next Wednesday to see if she can make it to the appointment on 12/20.

## 2023-05-31 NOTE — Telephone Encounter (Signed)
I reviewed the notes.  The tachycardia was likely caused by anemia.  She should continue with metoprolol for now and follow-up as planned.

## 2023-06-07 ENCOUNTER — Ambulatory Visit: Payer: Medicare Other | Admitting: Cardiovascular Disease

## 2023-06-26 ENCOUNTER — Telehealth: Payer: Self-pay | Admitting: Physician Assistant

## 2023-06-26 NOTE — Telephone Encounter (Signed)
 Patient made aware of results. Results will be mailed to her per her request. The patient stated that she is  now is hospice and will not be following up in person.     Bernardino Alicia Bring, PA-C 06/07/2023  1:57 PM EST     Heart monitor showed a predominant sinus with an average rate of 80 bpm with a range of 60 to 168 bpm, an 11% A-fib burden, and rare extra beats from the bottom portion of the heart.  A-fib was also noted during recent admission to [facility] where it looks like she is pursuing hospice care by the discharge summary.  She was not placed on anticoagulation due to transfusion dependent anemia and comorbidities.  She ended up canceling her appointment with Dr. Darron on 12/20.  If appropriate, please have this rescheduled with primary cardiologist, unless she is pursuing comfort measures only

## 2023-06-26 NOTE — Telephone Encounter (Signed)
 Pt is requesting a callback regarding her results from the monitor and she'd like a copy of the results mailed to the address we have on her chart. Please advise

## 2023-07-29 ENCOUNTER — Telehealth: Payer: Self-pay | Admitting: Oncology

## 2023-07-29 NOTE — Telephone Encounter (Signed)
 Patients caregiver called to let you know she is bedridden and can not come to appointments. She requested appointments be cancelled.  Appointments cancelled as requested.

## 2023-08-15 ENCOUNTER — Ambulatory Visit: Payer: Medicare Other | Admitting: Oncology

## 2023-08-15 ENCOUNTER — Other Ambulatory Visit: Payer: Medicare Other

## 2023-08-16 ENCOUNTER — Ambulatory Visit: Payer: Medicare Other

## 2023-09-17 ENCOUNTER — Other Ambulatory Visit: Payer: Medicare Other

## 2023-09-17 ENCOUNTER — Ambulatory Visit: Payer: Medicare Other | Admitting: Oncology

## 2023-09-18 ENCOUNTER — Ambulatory Visit: Payer: Medicare Other

## 2023-11-15 ENCOUNTER — Emergency Department

## 2023-11-15 ENCOUNTER — Encounter: Payer: Self-pay | Admitting: Medical Oncology

## 2023-11-15 ENCOUNTER — Emergency Department
Admission: EM | Admit: 2023-11-15 | Discharge: 2023-11-15 | Disposition: A | Discharge status: Home / Self Care | Attending: Emergency Medicine | Admitting: Emergency Medicine

## 2023-11-15 ENCOUNTER — Other Ambulatory Visit: Payer: Self-pay

## 2023-11-15 DIAGNOSIS — S0993XA Unspecified injury of face, initial encounter: Secondary | ICD-10-CM | POA: Diagnosis present

## 2023-11-15 DIAGNOSIS — W01198A Fall on same level from slipping, tripping and stumbling with subsequent striking against other object, initial encounter: Secondary | ICD-10-CM | POA: Diagnosis not present

## 2023-11-15 DIAGNOSIS — S0990XA Unspecified injury of head, initial encounter: Secondary | ICD-10-CM

## 2023-11-15 DIAGNOSIS — S0083XA Contusion of other part of head, initial encounter: Secondary | ICD-10-CM | POA: Diagnosis not present

## 2023-11-15 DIAGNOSIS — I110 Hypertensive heart disease with heart failure: Secondary | ICD-10-CM | POA: Insufficient documentation

## 2023-11-15 DIAGNOSIS — Z79899 Other long term (current) drug therapy: Secondary | ICD-10-CM | POA: Insufficient documentation

## 2023-11-15 DIAGNOSIS — S40811A Abrasion of right upper arm, initial encounter: Secondary | ICD-10-CM | POA: Insufficient documentation

## 2023-11-15 DIAGNOSIS — W19XXXA Unspecified fall, initial encounter: Secondary | ICD-10-CM

## 2023-11-15 DIAGNOSIS — Z7982 Long term (current) use of aspirin: Secondary | ICD-10-CM | POA: Insufficient documentation

## 2023-11-15 DIAGNOSIS — S80211A Abrasion, right knee, initial encounter: Secondary | ICD-10-CM | POA: Diagnosis not present

## 2023-11-15 DIAGNOSIS — I5032 Chronic diastolic (congestive) heart failure: Secondary | ICD-10-CM | POA: Insufficient documentation

## 2023-11-15 DIAGNOSIS — Z8572 Personal history of non-Hodgkin lymphomas: Secondary | ICD-10-CM | POA: Diagnosis not present

## 2023-11-15 DIAGNOSIS — M25561 Pain in right knee: Secondary | ICD-10-CM

## 2023-11-15 LAB — URINALYSIS, ROUTINE W REFLEX MICROSCOPIC
Bilirubin Urine: NEGATIVE
Glucose, UA: NEGATIVE mg/dL
Hgb urine dipstick: NEGATIVE
Ketones, ur: NEGATIVE mg/dL
Leukocytes,Ua: NEGATIVE
Nitrite: NEGATIVE
Protein, ur: NEGATIVE mg/dL
Specific Gravity, Urine: 1.02 (ref 1.005–1.030)
pH: 5 (ref 5.0–8.0)

## 2023-11-15 MED ORDER — ACETAMINOPHEN 325 MG PO TABS
650.0000 mg | ORAL_TABLET | Freq: Once | ORAL | Status: AC
  Administered 2023-11-15: 650 mg via ORAL
  Filled 2023-11-15: qty 2

## 2023-11-15 NOTE — Progress Notes (Signed)
 [facility] Note  Patient is currently followed at home by [facility].    Patient hospice episode detail report sent to Arlin Benes for upload in patient's medical record.  Ambrosio Junker, RN Nurse Liaison [phone]

## 2023-11-15 NOTE — ED Notes (Signed)
 Patient requested to be put on 2L nasal cannula. She states that this is her order at home, 2L [place] 24/7 per her Dr. Consuelo Denmark O2 saturation without the supplemental oxygen is 100%

## 2023-11-15 NOTE — ED Provider Notes (Signed)
 [facility] Provider Note    Event Date/Time   First MD Initiated Contact with Patient 11/15/23 0102     (approximate)   History   Fall   HPI  Alicia Moran is a 89 y.o. female brought to the ED via EMS from home with a chief complaint of mechanical fall.  Patient lost her balance transferring from her bed to the bedside commode and fell, striking her forehead on the dresser.  Denies LOC.  Denies anticoagulants.  Endorses right knee and upper arm pain.  Denies vision changes, neck pain, chest pain, shortness of breath, abdominal pain, nausea, vomiting or dizziness.     Past Medical History   Past Medical History:  Diagnosis Date   Hyperlipidemia    Lymphoma (HCC) 02/2023   Migraine headache    Stroke [facility])      Active Problem List   Patient Active Problem List   Diagnosis Date Noted   Gastroenteritis 09/07/2022   Hyponatremia 09/07/2022   CVA (cerebral vascular accident) (HCC) 09/06/2022   Pyuria 09/06/2022   Dizziness 09/06/2022   Carotid stenosis 08/07/2022   Pancytopenia (HCC) 02/17/2022   Chronic diastolic CHF (congestive heart failure) (HCC) 12/23/2021   Moderate mitral regurgitation 12/23/2021   Mild aortic stenosis 12/23/2021   Arthritis of left knee 12/22/2021   Malnutrition of moderate degree 01/30/2021   Colitis due to Clostridium difficile 01/30/2021   Acute pain of right knee    Weakness 01/29/2021   Disorder of bursae of shoulder region 03/15/2020   Cervical spondylosis 06/30/2018   Muscle strain of left scapular region 06/30/2018   Degenerative tear of medial meniscus of left knee 03/12/2018   Rotator cuff tendinitis, right 03/12/2018   Stress fracture of right calcaneus 01/14/2018   Left knee pain 10/23/2017   Stress fracture of left femur with delayed healing 10/23/2017   Osteoarthritis of knee 09/26/2017   Dysuria 01/19/2017   Acute bronchitis 09/19/2016   HTN (hypertension) 09/13/2016   Monoclonal gammopathy  of unknown significance (MGUS) 09/11/2016   Fatigue 05/05/2016   Thrombocytopenia (HCC) 05/03/2016   Osteoporosis 03/20/2016   Valvular heart disease 12/23/2015   Constipation 09/04/2015   Perforation of right tympanic membrane 03/28/2015   Mild cerebral atrophy (HCC) 03/02/2015   Neuropathy 03/02/2015   History of falling, presenting hazards to health 07/17/2014   Iron deficiency anemia due to chronic blood loss 07/17/2014   Positive FIT (fecal immunochemical test) 07/17/2014   Atherosclerotic peripheral vascular disease (HCC) 07/17/2014   H/O: CVA (cerebrovascular accident) 06/14/2014   Subdural hematoma (HCC) 06/12/2014   Adjustment disorder with mixed anxiety and depressed mood 10/31/2013   Intertrochanteric fracture of right hip (HCC) 03/05/2013   Lens replaced by other means 04/25/2012   Senile cataract 04/25/2012   Severe stage glaucoma 12/01/2011   Paroxysmal atrial fibrillation (HCC)    Stroke (HCC)    Migraine headache    High cholesterol      Past Surgical History  History reviewed. No pertinent surgical history.   Home Medications   Prior to Admission medications   Medication Sig Start Date End Date Taking? Authorizing Provider  acetaminophen  (TYLENOL ) 500 MG tablet Take 500-1,000 mg by mouth every 6 (six) hours as needed (pain).    [provider]  ALPRAZolam  (XANAX ) 0.25 MG tablet Take 0.5 tablets (0.125 mg total) by mouth 2 (two) times daily as needed for anxiety or sleep. 12/26/21   Sheril Dines, MD  aspirin  81 MG tablet Take 81 mg  by mouth daily.    [provider]  Blood Pressure Monitoring (BLOOD PRESSURE CUFF) MISC Take blood pressure daily 02/04/23 02/10/24  Wenona Hamilton, MD  Cholecalciferol  50 MCG (2000 UT) CAPS Take 2,000 Units by mouth daily.    [provider]  Coenzyme Q10 (COQ10) 50 MG CAPS Take 50 mg by mouth daily.    [provider]  cyclobenzaprine (FEXMID) 7.5 MG tablet Take 7.5 mg by mouth 3 (three)  times daily as needed for muscle spasms. Patient not taking: Reported on 04/04/2023    [provider]  famotidine  (PEPCID ) 10 MG tablet Take 10 mg by mouth daily as needed for heartburn or indigestion.    [provider]  glucosamine-chondroitin 500-400 MG tablet Take 1 tablet by mouth as directed.    [provider]  meclizine  (ANTIVERT ) 25 MG tablet Take 25 mg by mouth 3 (three) times daily as needed. 12/05/21   [provider]  metoprolol  succinate (TOPROL -XL) 50 MG 24 hr tablet Take 1 tablet (50 mg total) by mouth daily. 01/23/23   Wenona Hamilton, MD  Multiple Vitamins-Minerals (PRESERVISION AREDS 2+MULTI VIT PO) Take 1 tablet by mouth daily.    [provider]  rOPINIRole  (REQUIP ) 0.25 MG tablet Take 1 tablet (0.25 mg total) by mouth 2 (two) times daily. Patient taking differently: Take 0.25 mg by mouth at bedtime. 12/26/21   Sheril Dines, MD  simvastatin  (ZOCOR ) 10 MG tablet TAKE 1 TABLET AT BEDTIME 01/30/23   Wenona Hamilton, MD     Allergies  Erythromycin and Nsaids   Family History   Family History  Problem Relation Age of Onset   Hypertension Mother    Diabetes Mother    Hypertension Father      Physical Exam  Triage Vital Signs: ED Triage Vitals  Encounter Vitals Group     BP 11/15/23 0100 (!) 145/98     Systolic BP Percentile --      Diastolic BP Percentile --      Pulse Rate 11/15/23 0100 (!) 103     Resp 11/15/23 0100 20     Temp 11/15/23 0100 97.7 F (36.5 C)     Temp Source 11/15/23 0100 Oral     SpO2 11/15/23 0100 100 %     Weight 11/15/23 0101 110 lb 3.7 oz (50 kg)     Height 11/15/23 0101 5\' 2"  (1.575 m)     Head Circumference --      Peak Flow --      Pain Score 11/15/23 0100 6     Pain Loc --      Pain Education --      Exclude from Growth Chart --     Updated Vital Signs: BP 132/81   Pulse 85   Temp 97.7 F (36.5 C) (Oral)   Resp 19   Ht 5\' 2"  (1.575 m)   Wt 50 kg   SpO2 100%   BMI 20.16  kg/m    General: Awake, mild distress.  CV:  Regular rate, irregular rhythm.  Good peripheral perfusion.  Resp:  Normal effort.  CTAB. Abd:  Nontender.  No distention.  Other:  Forehead hematoma.  PERRL.  EOMI.  Nose is atraumatic.  No dental malocclusion.  No midline cervical spine tenderness to palpation, step-offs or deformities noted.  Right upper arm abrasion.  Right shoulder/elbow/wrist with full range of motion without pain.  No spinal tenderness.  Pelvis is stable.  Right hip with full range  of motion without pain.  Abrasion noted to anterior right knee.  Alert and oriented to person and place.  CN II-XII grossly intact.  5/5 motor strength and sensation all extremities.  MAE x 4.   ED Results / Procedures / Treatments  Labs (all labs ordered are listed, but only abnormal results are displayed) Labs Reviewed  URINALYSIS, ROUTINE W REFLEX MICROSCOPIC - Abnormal; Notable for the following components:      Result Value   Color, Urine YELLOW (*)    APPearance CLEAR (*)    All other components within normal limits     EKG  ED ECG REPORT I, Zanaria Morell J, the attending physician, personally viewed and interpreted this ECG.   Date: 11/15/2023  EKG Time: 0114  Rate: 89  Rhythm: atrial fibrillation, rate 89  Axis: Normal  Intervals:none  ST&T Change: Nonspecific    RADIOLOGY I have independently visualized and interpreted patient's imaging studies as well as noted the radiology interpretation:  CT head: No ICH  Right knee x-ray: Negative  Official radiology report(s): DG Knee Complete 4 Views Right Result Date: 11/15/2023 CLINICAL DATA:  Fall, pain EXAM: RIGHT KNEE - COMPLETE 4+ VIEW COMPARISON:  01/29/2021 FINDINGS: Hardware partially visualized in the distal femur related to remote trauma. Joint space narrowing most pronounced in the lateral compartment. No acute bony abnormality. Specifically, no fracture, subluxation, or dislocation. No joint effusion. Diffuse vascular  calcifications. IMPRESSION: No acute bony abnormality. Electronically Signed   By: Janeece Mechanic M.D.   On: 11/15/2023 01:41   CT Head Wo Contrast Result Date: 11/15/2023 CLINICAL DATA:  Head trauma, minor (Age >= 65y).  Fall, hit head EXAM: CT HEAD WITHOUT CONTRAST TECHNIQUE: Contiguous axial images were obtained from the base of the skull through the vertex without intravenous contrast. RADIATION DOSE REDUCTION: This exam was performed according to the departmental dose-optimization program which includes automated exposure control, adjustment of the mA and/or kV according to patient size and/or use of iterative reconstruction technique. COMPARISON:  09/06/2022 FINDINGS: Brain: There is atrophy and chronic small vessel disease changes. No acute intracranial abnormality. Specifically, no hemorrhage, hydrocephalus, mass lesion, acute infarction, or significant intracranial injury. Vascular: No hyperdense vessel or unexpected calcification. Skull: No acute calvarial abnormality. Sinuses/Orbits: No acute findings Other: Soft tissue swelling in the forehead. IMPRESSION: Atrophy, chronic microvascular disease. No acute intracranial abnormality. Electronically Signed   By: Janeece Mechanic M.D.   On: 11/15/2023 01:36     PROCEDURES:  Critical Care performed: No  Procedures   MEDICATIONS ORDERED IN ED: Medications  acetaminophen  (TYLENOL ) tablet 650 mg (650 mg Oral Given 11/15/23 0144)     IMPRESSION / MDM / ASSESSMENT AND PLAN / ED COURSE  I reviewed the triage vital signs and the nursing notes.                             89 year old female presenting with forehead hematoma, right knee abrasion status post mechanical fall.  Differential diagnosis includes but is not limited to ICH, SDH, fracture, musculoskeletal contusion, etc.  I have personally reviewed patient's records and note hospitalization for anemia 05/22/2023.  And a cardiology office note dated 04/04/2023 it mentions there is documentation  from [facility] of paroxysmal atrial fibrillation.  Patient's presentation is most consistent with acute, uncomplicated illness.  Clinical Course as of 11/15/23 4098  Fri Nov 15, 2023  0211 Imaging studies negative for acute traumatic injury.  Awaiting UA. [JS]  0336 UA  negative.  Patient visiting with her cousin.  Will apply Ace wrap to right knee.  Patient has 24/7 help at home.  Strict return precautions given.  Patient verbalizes understanding and agrees with plan of care. [JS]    Clinical Course User Index [JS] Norlene Beavers, MD   FINAL CLINICAL IMPRESSION(S) / ED DIAGNOSES   Final diagnoses:  Fall, initial encounter  Contusion of face, initial encounter  Injury of head, initial encounter     Rx / DC Orders   ED Discharge Orders     None        Note:  This document was prepared using Dragon voice recognition software and may include unintentional dictation errors.   Mack Thurmon J, MD 11/15/23 [phone]

## 2023-11-15 NOTE — ED Notes (Signed)
 ED Provider at bedside.

## 2023-11-15 NOTE — Discharge Instructions (Addendum)
 You may take Tylenol  as needed for pain.  Apply ice to affected area several times daily to reduce swelling.  Wear Ace wrap as needed for comfort.  Return to the ER for worsening symptoms, persistent vomiting, difficulty breathing or other concerns.

## 2023-11-15 NOTE — ED Notes (Signed)
 Called to [employer] Per RN Madison @348  am/Transport Home/Rep [employer]

## 2023-11-15 NOTE — ED Triage Notes (Addendum)
 Pt from home, lost balance transferring from bed to bed side commode and fell, hit head on dresser. Pt has hematoma to forehead and pain to rt knee and rt elbow. Pt currently on hospice care for [place]. No thinners. DNR with patient. A/O.
